# Patient Record
Sex: Male | Born: 1970 | Race: White | Hispanic: No | Marital: Single | Smoking: Never smoker
Health system: Southern US, Community
[De-identification: ages and names within clinical notes are randomized; demographics above are authoritative.]

## PROBLEM LIST (undated history)

## (undated) DIAGNOSIS — I2510 Atherosclerotic heart disease of native coronary artery without angina pectoris: Secondary | ICD-10-CM

## (undated) DIAGNOSIS — E785 Hyperlipidemia, unspecified: Secondary | ICD-10-CM

## (undated) DIAGNOSIS — F319 Bipolar disorder, unspecified: Secondary | ICD-10-CM

## (undated) DIAGNOSIS — E119 Type 2 diabetes mellitus without complications: Secondary | ICD-10-CM

## (undated) DIAGNOSIS — R7303 Prediabetes: Secondary | ICD-10-CM

## (undated) HISTORY — DX: Atherosclerotic heart disease of native coronary artery without angina pectoris: I25.10

## (undated) HISTORY — DX: Hyperlipidemia, unspecified: E78.5

## (undated) HISTORY — DX: Prediabetes: R73.03

## (undated) HISTORY — DX: Type 2 diabetes mellitus without complications: E11.9

---

## 2004-03-03 ENCOUNTER — Inpatient Hospital Stay: Payer: Self-pay | Admitting: Internal Medicine

## 2006-11-01 ENCOUNTER — Emergency Department: Payer: Self-pay | Admitting: Emergency Medicine

## 2007-03-05 ENCOUNTER — Emergency Department: Payer: Self-pay | Admitting: Emergency Medicine

## 2008-09-06 IMAGING — CT CT CERVICAL SPINE WITHOUT CONTRAST
1 series · 12 of 14 positions shown, 15 images · non-contrast
Comparison: none

REASON FOR EXAM: trauma
COMMENTS:

[Series 4: axial · axial · 0.32mm/px · z∈[-254,-81]mm · 12 of 106 slices shown, 15 images]
[im 9/106  soft-tissue]
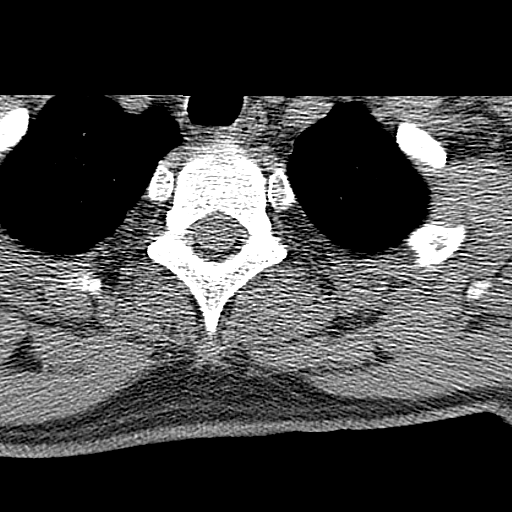
[im 9/106  bone]
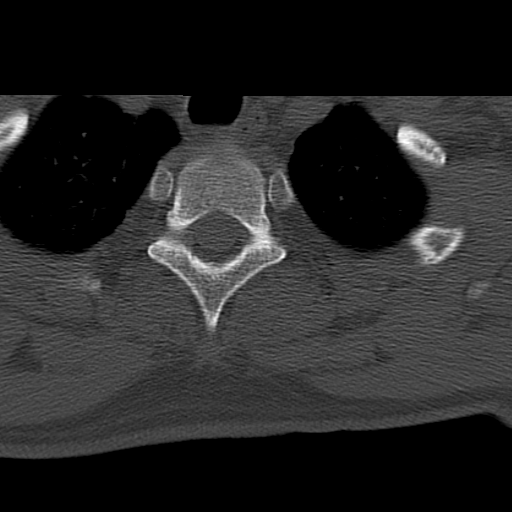
[im 17/106  bone]
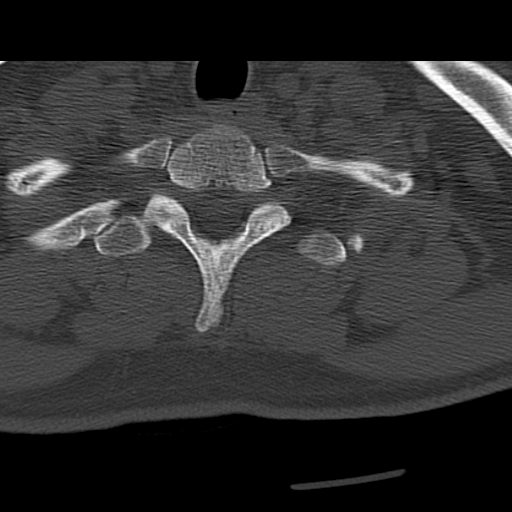
[im 25/106  bone]
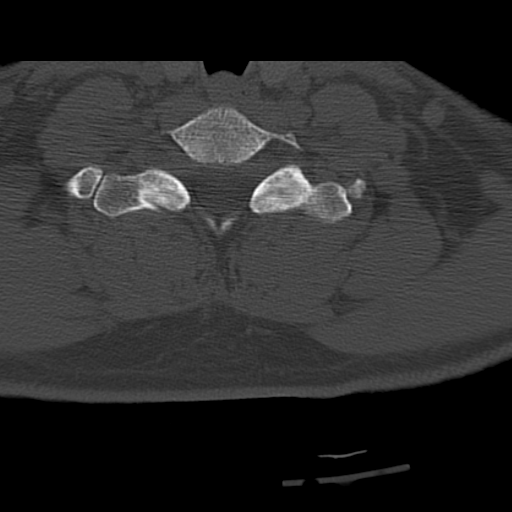
[im 33/106  bone]
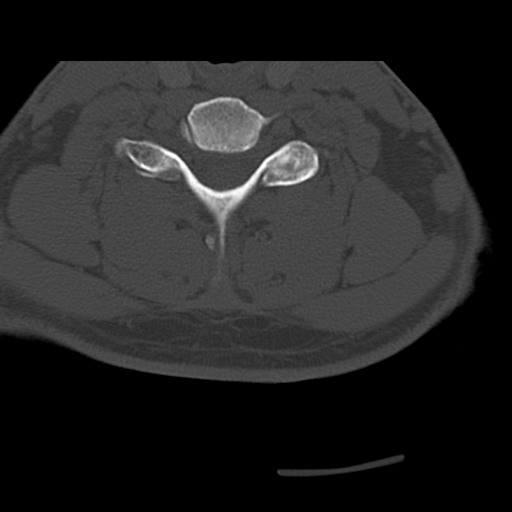
[im 41/106  soft-tissue]
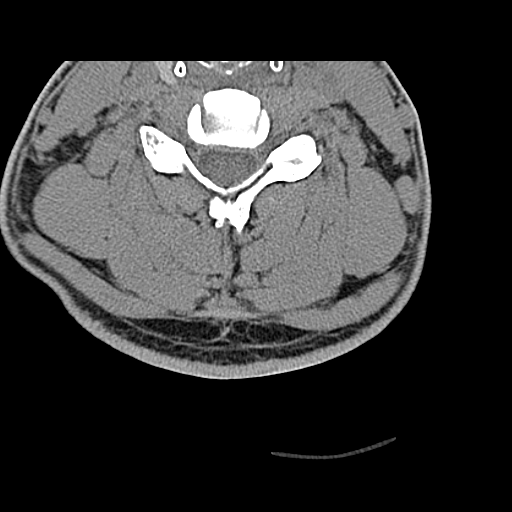
[im 41/106  bone]
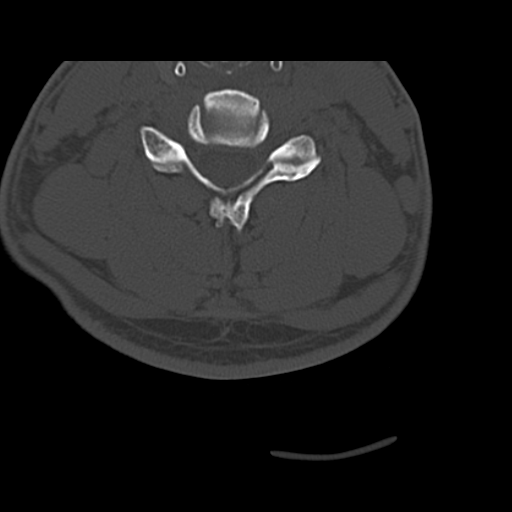
[im 49/106  bone]
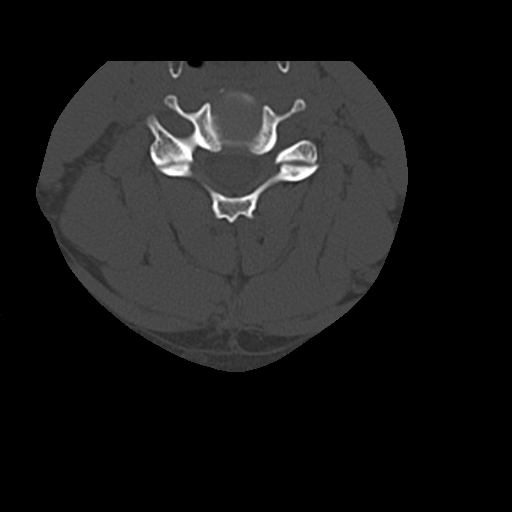
[im 57/106  bone]
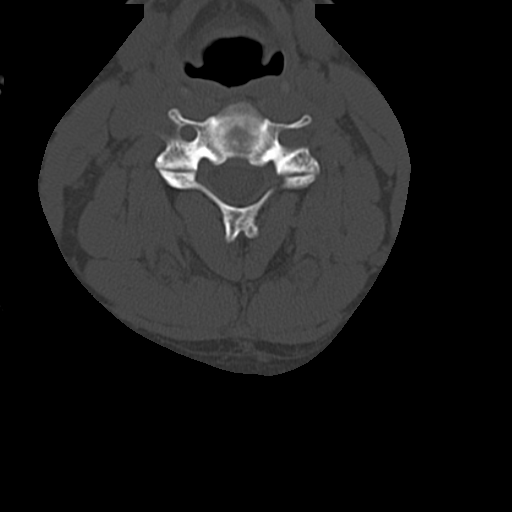
[im 65/106  bone]
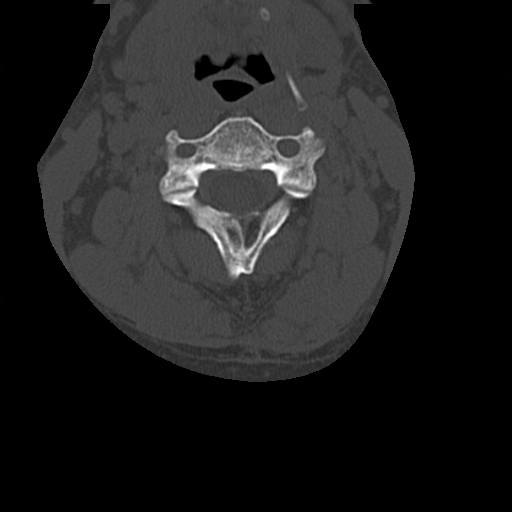
[im 73/106  soft-tissue]
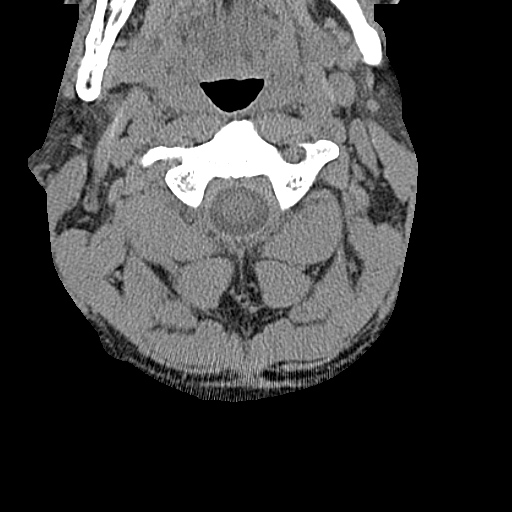
[im 73/106  bone]
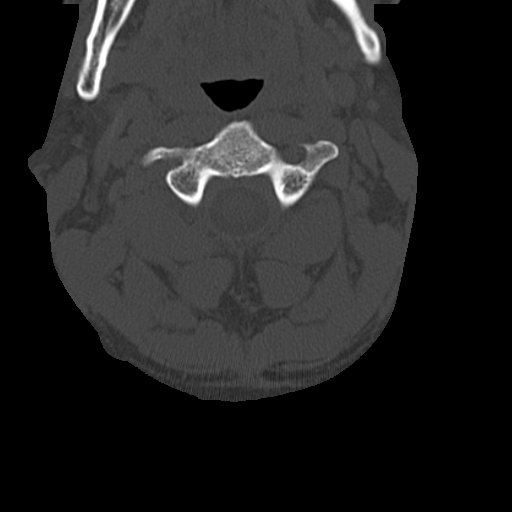
[im 81/106  bone]
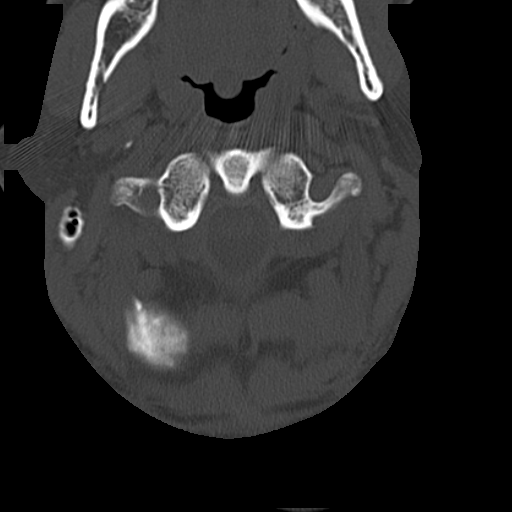
[im 89/106  bone]
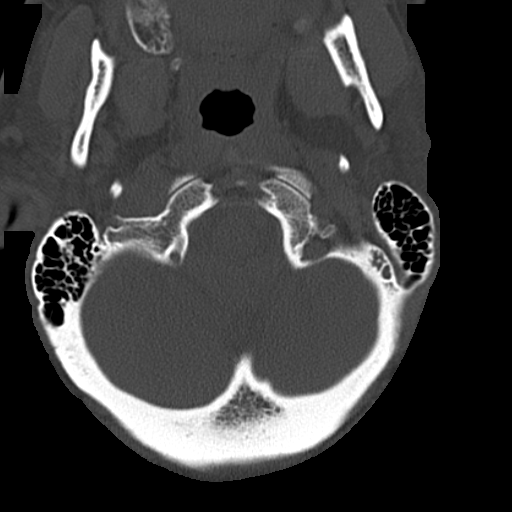
[im 97/106  bone]
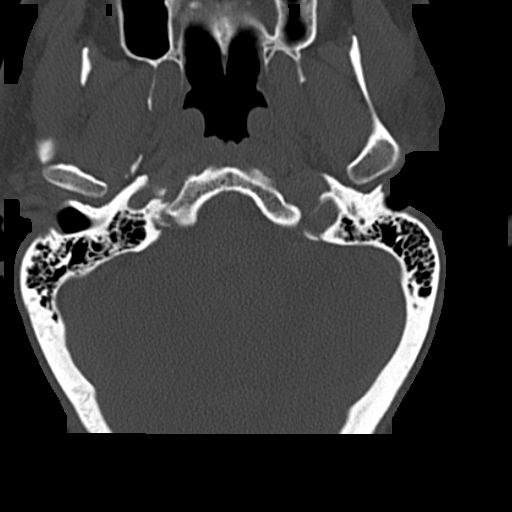

[12 of 14 positions shown; findings below may reference images not displayed]

PROCEDURE:     CT  - CT CERVICAL SPINE WO  - November 01, 2006 [DATE]

RESULT:     Emergent multislice CT of the cervical spine is performed. The
images reconstructed at bone window settings in the axial, coronal and
sagittal planes.

The study demonstrates normal alignment. The prevertebral soft tissues are
normal. The craniocervical junction and C1-C2 articulation are normal. The
odontoid is intact.
IMPRESSION: No acute bony abnormality of the cervical spine.

## 2008-09-07 IMAGING — CR DG CHEST 1V PORT
1 series · 1 of 1 positions shown · non-contrast
Comparison: none

REASON FOR EXAM: trauma
COMMENTS:

PROCEDURE:     DXR - DXR PORTABLE CHEST SINGLE VIEW  - November 02, 2006 [DATE]
RESULT:     The lung fields are clear. The heart, mediastinal and osseous
structures show no significant abnormalities.

[view not recorded]
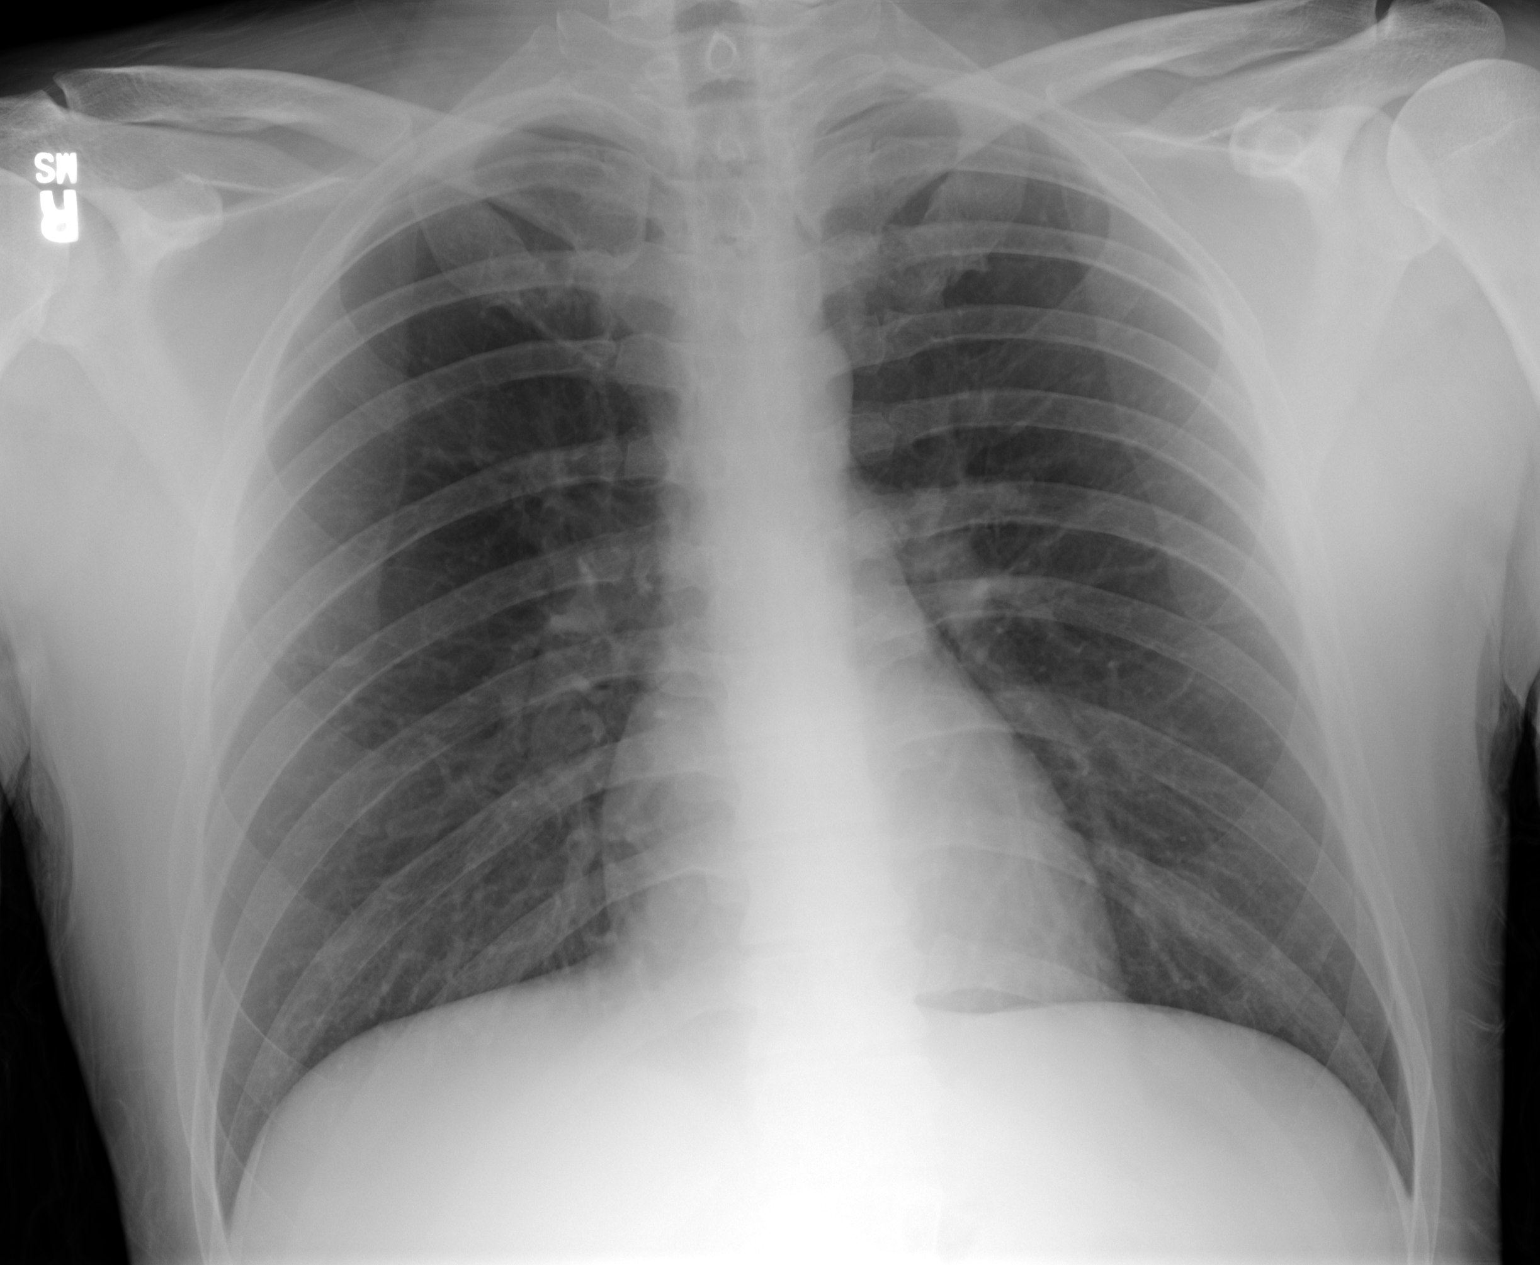

[1 of 1 positions shown; findings below may reference images not displayed]

IMPRESSION: No significant abnormalities are noted.

## 2020-04-14 ENCOUNTER — Encounter: Payer: Self-pay | Admitting: *Deleted

## 2020-04-14 ENCOUNTER — Encounter: Payer: Self-pay | Admitting: Gastroenterology

## 2020-05-05 ENCOUNTER — Other Ambulatory Visit: Payer: Self-pay

## 2020-05-05 ENCOUNTER — Ambulatory Visit: Payer: 59 | Admitting: Gastroenterology

## 2020-05-05 ENCOUNTER — Encounter: Payer: Self-pay | Admitting: Gastroenterology

## 2020-05-05 VITALS — BP 150/96 | HR 96 | Temp 98.6°F | Ht 68.0 in | Wt 201.2 lb

## 2020-05-05 DIAGNOSIS — R1319 Other dysphagia: Secondary | ICD-10-CM

## 2020-05-05 DIAGNOSIS — Z1211 Encounter for screening for malignant neoplasm of colon: Secondary | ICD-10-CM

## 2020-05-05 DIAGNOSIS — R1310 Dysphagia, unspecified: Secondary | ICD-10-CM

## 2020-05-05 MED ORDER — NA SULFATE-K SULFATE-MG SULF 17.5-3.13-1.6 GM/177ML PO SOLN: 354.0000 mL | Freq: Once | ORAL | 0 refills | Status: AC

## 2020-05-05 NOTE — Patient Instructions (Signed)
    RE: MyChart  Dear Aaron Bowers  MyChart makes it easy for you to view your health information - all in one secure location - from any computer or mobile device at any time. Use the activation code below to enroll in MyChart online at [email or web address]   Once your account is activated, you can:  Marland Kitchen View your test results. . Communicate securely with your physician's office.  . View your medical history, allergies, medications, and immunizations. . Receive care virtually through an e-Visit.   If you are over 18, you may use features of MyChart to manage the health information of your spouse, children or others you care for.  Download child and adult access forms at [email or web address].    As you activate your MyChart account and need any technical assistance, please call the MyChart technical support line at [phone]).  Be sure to also download the MyChart app for your mobile device.   Thank you for choosing [facility] for your family's health care needs!   MyChart Activation Code:  [email or web address] Expires: 06/19/2020  9:24 AM             [facility]  [address] [place], [place] [address]

## 2020-05-05 NOTE — Progress Notes (Signed)
Cephas Darby, MD [address]  [place]  [place], [place] [address]  Main: [phone]  Fax: [phone]    Gastroenterology Consultation  Referring Provider:     Ileene Bowers, [place] Primary Care Physician:  No primary care provider on file. Primary Gastroenterologist:  Dr. Cephas Darby Reason for Consultation:     Dysphagia        HPI:   Aaron Bowers is a 50 y.o. male referred by Dr. No primary care provider on file.  for consultation & management of dysphagia.  Patient has been experiencing difficulty swallowing to solids for more than a year and his symptoms have been progressively worsening.  He reports food getting stuck as well as episodes of food impaction where he had to throw it backup or force it to swallow.  He also reports difficulty swallowing to liquids sometimes.  He feels like choking sensation and tightness in his chest.  He does have history of heartburn.  He was taking omeprazole 20 mg daily until 1 year ago.  Recently due to worsening of symptoms, his PCP has started him on omeprazole 40 mg once a day.  His weight is stable.  He does report seasonal allergies.  He is also here to discuss about colonoscopy for colon cancer screening  NSAIDs: Has been taking ibuprofen 600 mg 3 times daily for his bulge disc for last 3 days  Antiplts/Anticoagulants/Anti thrombotics: None   GI Procedures: None He denies family history of GI malignancy  History reviewed. No pertinent past medical history.  History reviewed. No pertinent surgical history.  Current Outpatient Medications:  .  divalproex (DEPAKOTE ER) 500 MG 24 hr tablet, Take 500 mg by mouth 3 (three) times daily., Disp: , Rfl:  .  lamoTRIgine (LAMICTAL) 150 MG tablet, Take 150 mg by mouth 2 (two) times daily., Disp: , Rfl:  .  metaxalone (SKELAXIN) 800 MG tablet, Take 800 mg by mouth 3 (three) times daily as needed., Disp: , Rfl:  .  Na Sulfate-K Sulfate-Mg Sulf 17.5-3.13-1.6 GM/177ML SOLN, Take 354  mLs by mouth once for 1 dose., Disp: 354 mL, Rfl: 0 .  omeprazole (PRILOSEC) 40 MG capsule, Take 1 capsule by mouth daily., Disp: , Rfl:    History reviewed. No pertinent family history.   Social History   Tobacco Use  . Smoking status: Never Smoker  . Smokeless tobacco: Never Used  Substance Use Topics  . Alcohol use: Not Currently  . Drug use: Never    Allergies as of 05/05/2020  . (No Known Allergies)    Review of Systems:    All systems reviewed and negative except where noted in HPI.   Physical Exam:  BP (!) 150/96 (BP Location: Left Arm, Patient Position: Sitting, Cuff Size: Normal)   Pulse 96   Temp 98.6 F (37 C) (Oral)   Ht 5\' 8"  (1.727 m)   Wt 201 lb 4 oz (91.3 kg)   BMI 30.60 kg/m  No LMP for male patient.  General:   Alert,  Well-developed, well-nourished, pleasant and cooperative in NAD Head:  Normocephalic and atraumatic. Eyes:  Sclera clear, no icterus.   Conjunctiva pink. Ears:  Normal auditory acuity. Nose:  No deformity, discharge, or lesions. Mouth:  No deformity or lesions,oropharynx pink & moist. Neck:  Supple; no masses or thyromegaly. Lungs:  Respirations even and unlabored.  Clear throughout to auscultation.   No wheezes, crackles, or rhonchi. No acute distress. Heart:  Regular rate and rhythm; no  murmurs, clicks, rubs, or gallops. Abdomen:  Normal bowel sounds. Soft, non-tender and non-distended without masses, hepatosplenomegaly or hernias noted.  No guarding or rebound tenderness.   Rectal: Not performed Msk:  Symmetrical without gross deformities. Good, equal movement & strength bilaterally. Pulses:  Normal pulses noted. Extremities:  No clubbing or edema.  No cyanosis. Neurologic:  Alert and oriented x3;  grossly normal neurologically. Skin:  Intact without significant lesions or rashes. No jaundice. Psych:  Alert and cooperative. Normal mood and affect.  Imaging Studies: No abdominal imaging  Assessment and Plan:   Aaron Bowers is a 50 y.o. Caucasian male with chronic dysphagia to solids and sometimes to liquids, progressively worse  Dysphagia Recommend EGD to rule out eosinophilic esophagitis, peptic stricture, recommend esophageal biopsies Increase omeprazole to 40 mg twice daily before meals Avoid hard meats/foods  Colon cancer screening, average risk Recommend screening colonoscopy   Follow up in 3 months   Cephas Darby, MD

## 2020-05-13 ENCOUNTER — Encounter: Payer: Self-pay | Admitting: Gastroenterology

## 2020-05-13 ENCOUNTER — Encounter
Admission: RE | Disposition: A | Discharge status: Home / Self Care | Payer: Self-pay | Source: Home / Self Care | Attending: Gastroenterology

## 2020-05-13 ENCOUNTER — Other Ambulatory Visit: Payer: Self-pay

## 2020-05-13 ENCOUNTER — Ambulatory Visit
Admission: RE | Admit: 2020-05-13 | Discharge: 2020-05-13 | Disposition: A | Discharge status: Home / Self Care | Payer: No Typology Code available for payment source | Attending: Gastroenterology | Admitting: Gastroenterology

## 2020-05-13 ENCOUNTER — Ambulatory Visit: Payer: No Typology Code available for payment source | Admitting: Certified Registered Nurse Anesthetist

## 2020-05-13 DIAGNOSIS — Z79899 Other long term (current) drug therapy: Secondary | ICD-10-CM | POA: Insufficient documentation

## 2020-05-13 DIAGNOSIS — D122 Benign neoplasm of ascending colon: Secondary | ICD-10-CM | POA: Diagnosis not present

## 2020-05-13 DIAGNOSIS — R1310 Dysphagia, unspecified: Secondary | ICD-10-CM

## 2020-05-13 DIAGNOSIS — Z1211 Encounter for screening for malignant neoplasm of colon: Secondary | ICD-10-CM

## 2020-05-13 DIAGNOSIS — R1314 Dysphagia, pharyngoesophageal phase: Secondary | ICD-10-CM | POA: Diagnosis not present

## 2020-05-13 DIAGNOSIS — K635 Polyp of colon: Secondary | ICD-10-CM | POA: Diagnosis not present

## 2020-05-13 DIAGNOSIS — K2100 Gastro-esophageal reflux disease with esophagitis, without bleeding: Secondary | ICD-10-CM | POA: Insufficient documentation

## 2020-05-13 DIAGNOSIS — K2289 Other specified disease of esophagus: Secondary | ICD-10-CM | POA: Diagnosis not present

## 2020-05-13 DIAGNOSIS — D123 Benign neoplasm of transverse colon: Secondary | ICD-10-CM | POA: Diagnosis not present

## 2020-05-13 HISTORY — PX: COLONOSCOPY WITH PROPOFOL: SHX5780

## 2020-05-13 HISTORY — PX: ESOPHAGOGASTRODUODENOSCOPY (EGD) WITH PROPOFOL: SHX5813

## 2020-05-13 SURGERY — COLONOSCOPY WITH PROPOFOL
Anesthesia: General

## 2020-05-13 MED ORDER — PROPOFOL 500 MG/50ML IV EMUL
INTRAVENOUS | Status: DC | PRN
  Administered 2020-05-13: 175 ug/kg/min via INTRAVENOUS

## 2020-05-13 MED ORDER — PROPOFOL 10 MG/ML IV BOLUS
INTRAVENOUS | Status: DC | PRN
  Administered 2020-05-13: 80 mg via INTRAVENOUS
  Administered 2020-05-13: 40 mg via INTRAVENOUS

## 2020-05-13 MED ORDER — SODIUM CHLORIDE 0.9 % IV SOLN
INTRAVENOUS | Status: DC
  Administered 2020-05-13: 20 mL/h via INTRAVENOUS

## 2020-05-13 MED ORDER — LIDOCAINE HCL (PF) 2 % IJ SOLN
INTRAMUSCULAR | Status: AC
  Filled 2020-05-13: qty 4

## 2020-05-13 MED ORDER — LIDOCAINE HCL (CARDIAC) PF 100 MG/5ML IV SOSY
PREFILLED_SYRINGE | INTRAVENOUS | Status: DC | PRN
  Administered 2020-05-13: 40 mg via INTRAVENOUS

## 2020-05-13 MED ORDER — PROPOFOL 500 MG/50ML IV EMUL
INTRAVENOUS | Status: AC
  Filled 2020-05-13: qty 50

## 2020-05-13 NOTE — Op Note (Signed)
[facility] Patient Name: Aaron Bowers Procedure Date: 05/13/2020 8:20 AM MRN: [id number] Account #: [id number] Date of Birth: [birth year] Admit Type: Outpatient Age: 50 Room: [facility] ENDO ROOM 4 Gender: Male Note Status: Finalized Procedure:             Colonoscopy Indications:           Screening for colorectal malignant neoplasm, This is                         the patient's first colonoscopy Providers:             Lin Landsman MD, MD Medicines:             General Anesthesia Complications:         No immediate complications. Estimated blood loss: None. Procedure:             Pre-Anesthesia Assessment:                        - Prior to the procedure, a History and Physical was                         performed, and patient medications and allergies were                         reviewed. The patient is competent. The risks and                         benefits of the procedure and the sedation options and                         risks were discussed with the patient. All questions                         were answered and informed consent was obtained.                         Patient identification and proposed procedure were                         verified by the physician, the nurse, the                         anesthesiologist, the anesthetist and the technician                         in the pre-procedure area in the procedure room in the                         endoscopy suite. Mental Status Examination: alert and                         oriented. Airway Examination: normal oropharyngeal                         airway and neck mobility. Respiratory Examination:                         clear to auscultation. CV Examination: normal.  Prophylactic Antibiotics: The patient does not require                         prophylactic antibiotics. Prior Anticoagulants: The                         patient has taken no previous  anticoagulant or                         antiplatelet agents. ASA Grade Assessment: II - A                         patient with mild systemic disease. After reviewing                         the risks and benefits, the patient was deemed in                         satisfactory condition to undergo the procedure. The                         anesthesia plan was to use general anesthesia.                         Immediately prior to administration of medications,                         the patient was re-assessed for adequacy to receive                         sedatives. The heart rate, respiratory rate, oxygen                         saturations, blood pressure, adequacy of pulmonary                         ventilation, and response to care were monitored                         throughout the procedure. The physical status of the                         patient was re-assessed after the procedure.                        After obtaining informed consent, the colonoscope was                         passed under direct vision. Throughout the procedure,                         the patient's blood pressure, pulse, and oxygen                         saturations were monitored continuously. The                         Colonoscope was introduced through the anus and  advanced to the the terminal ileum, with                         identification of the appendiceal orifice and IC                         valve. The colonoscopy was performed without                         difficulty. The patient tolerated the procedure well.                         The quality of the bowel preparation was evaluated                         using the BBPS [facility] Bowel Preparation Scale) with                         scores of: Right Colon = 3, Transverse Colon = 3 and                         Left Colon = 3 (entire mucosa seen well with no                         residual staining, small fragments  of stool or opaque                         liquid). The total BBPS score equals 9. Findings:      The perianal and digital rectal examinations were normal. Pertinent       negatives include normal sphincter tone and no palpable rectal lesions.      Two sessile polyps were found in the transverse colon and ascending       colon. The polyps were 5 mm in size. These polyps were removed with a       cold snare. Resection and retrieval were complete. Estimated blood loss:       none.      The terminal ileum appeared normal.      The retroflexed view of the distal rectum and anal verge was normal and       showed no anal or rectal abnormalities. Impression:            - Two 5 mm polyps in the transverse colon and in the                         ascending colon, removed with a cold snare. Resected                         and retrieved.                        - The examined portion of the ileum was normal.                        - The distal rectum and anal verge are normal on                         retroflexion view. Recommendation:        -  Discharge patient to home (with escort).                        - Resume previous diet today.                        - Continue present medications.                        - Await pathology results.                        - Repeat colonoscopy in 7 years for surveillance.                        - Return to my office as previously scheduled. Procedure Code(s):     --- Professional ---                        [phone], Colonoscopy, flexible; with removal of                         tumor(s), polyp(s), or other lesion(s) by snare                         technique Diagnosis Code(s):     --- Professional ---                        Z12.11, Encounter for screening for malignant neoplasm                         of colon                        K63.5, Polyp of colon CPT copyright 2019 American Medical Association. All rights reserved. The codes documented in this  report are preliminary and upon coder review may  be revised to meet current compliance requirements. Dr. Ulyess Mort Lin Landsman MD, MD 05/13/2020 9:08:51 AM This report has been signed electronically. Number of Addenda: 0 Note Initiated On: 05/13/2020 8:20 AM Scope Withdrawal Time: 0 hours 11 minutes 4 seconds  Total Procedure Duration: 0 hours 13 minutes 51 seconds  Estimated Blood Loss:  Estimated blood loss: none.      [facility]

## 2020-05-13 NOTE — Anesthesia Procedure Notes (Signed)
Date/Time: 05/13/2020 8:39 AM Performed by: Johnna Acosta, CRNA Pre-anesthesia Checklist: Patient identified, Emergency Drugs available, Suction available, Patient being monitored and Timeout performed Patient Re-evaluated:Patient Re-evaluated prior to induction Oxygen Delivery Method: Nasal cannula Preoxygenation: Pre-oxygenation with 100% oxygen Induction Type: IV induction

## 2020-05-13 NOTE — Anesthesia Preprocedure Evaluation (Signed)
Anesthesia Evaluation  Patient identified by MRN, date of birth, ID band Patient awake    Reviewed: Allergy & Precautions, NPO status , Patient's Chart, lab work & pertinent test results  Airway Mallampati: II  TM Distance: >3 FB Neck ROM: Full    Dental no notable dental hx.    Pulmonary neg pulmonary ROS,    Pulmonary exam normal        Cardiovascular negative cardio ROS Normal cardiovascular exam     Neuro/Psych PSYCHIATRIC DISORDERS Bipolar Disorder negative neurological ROS     GI/Hepatic negative GI ROS, Neg liver ROS,   Endo/Other  negative endocrine ROS  Renal/GU negative Renal ROS  negative genitourinary   Musculoskeletal negative musculoskeletal ROS (+)   Abdominal   Peds negative pediatric ROS (+)  Hematology negative hematology ROS (+)   Anesthesia Other Findings   Reproductive/Obstetrics negative OB ROS                             Anesthesia Physical Anesthesia Plan  ASA: II  Anesthesia Plan: General   Post-op Pain Management:    Induction: Intravenous  PONV Risk Score and Plan: Propofol infusion and TIVA  Airway Management Planned: Nasal Cannula and Natural Airway  Additional Equipment:   Intra-op Plan:   Post-operative Plan:   Informed Consent: I have reviewed the patients History and Physical, chart, labs and discussed the procedure including the risks, benefits and alternatives for the proposed anesthesia with the patient or authorized representative who has indicated his/her understanding and acceptance.       Plan Discussed with: CRNA, Anesthesiologist and Surgeon  Anesthesia Plan Comments:         Anesthesia Quick Evaluation

## 2020-05-13 NOTE — H&P (Signed)
  Aaron Darby, MD [place]  [place], [place] [address]  Main: [phone]  Fax: [phone] Pager: [phone]  Primary Care Physician:  Pcp, No Primary Gastroenterologist:  Dr. Cephas Bowers  Pre-Procedure History & Physical: HPI:  SHOLOM Bowers is a 50 y.o. male is here for an endoscopy and colonoscopy.   No past medical history on file.  No past surgical history on file.  Prior to Admission medications   Medication Sig Start Date End Date Taking? Authorizing Provider  divalproex (DEPAKOTE ER) 500 MG 24 hr tablet Take 500 mg by mouth 3 (three) times daily. 04/14/20  Yes [provider]  lamoTRIgine (LAMICTAL) 150 MG tablet Take 150 mg by mouth 2 (two) times daily. 04/14/20  Yes [provider]  metaxalone (SKELAXIN) 800 MG tablet Take 800 mg by mouth 3 (three) times daily as needed. 04/14/20  Yes [provider]  omeprazole (PRILOSEC) 40 MG capsule Take 1 capsule by mouth daily. 04/14/20  Yes [provider]    Allergies as of 05/05/2020  . (No Known Allergies)    No family history on file.  Social History   Socioeconomic History  . Marital status: Single    Spouse name: Not on file  . Number of children: Not on file  . Years of education: Not on file  . Highest education level: Not on file  Occupational History  . Not on file  Tobacco Use  . Smoking status: Never Smoker  . Smokeless tobacco: Never Used  Substance and Sexual Activity  . Alcohol use: Not Currently  . Drug use: Never  . Sexual activity: Not on file  Other Topics Concern  . Not on file  Social History Narrative  . Not on file   Social Determinants of Health   Financial Resource Strain: Not on file  Food Insecurity: Not on file  Transportation Needs: Not on file  Physical Activity: Not on file  Stress: Not on file  Social Connections: Not on file  Intimate Partner Violence: Not on file    Review of Systems: See HPI, otherwise  negative ROS  Physical Exam: BP 121/75   Pulse 68   Temp (!) 96.4 F (35.8 C) (Temporal)   Resp 20   Ht 5\' 8"  (1.727 m)   Wt 83.9 kg   SpO2 99%   BMI 28.13 kg/m  General:   Alert,  pleasant and cooperative in NAD Head:  Normocephalic and atraumatic. Neck:  Supple; no masses or thyromegaly. Lungs:  Clear throughout to auscultation.    Heart:  Regular rate and rhythm. Abdomen:  Soft, nontender and nondistended. Normal bowel sounds, without guarding, and without rebound.   Neurologic:  Alert and  oriented x4;  grossly normal neurologically.  Impression/Plan: JESSEY Bowers is here for an endoscopy and colonoscopy to be performed for dysphagia, colon cancer screening  Risks, benefits, limitations, and alternatives regarding  endoscopy and colonoscopy have been reviewed with the patient.  Questions have been answered.  All parties agreeable.   Sherri Sear, MD  05/13/2020, 8:28 AM

## 2020-05-13 NOTE — Anesthesia Postprocedure Evaluation (Signed)
Anesthesia Post Note  Patient: Aaron Bowers  Procedure(s) Performed: COLONOSCOPY WITH PROPOFOL (N/A ) ESOPHAGOGASTRODUODENOSCOPY (EGD) WITH PROPOFOL (N/A )  Patient location during evaluation: Phase II Anesthesia Type: General Level of consciousness: awake and alert, awake and oriented Pain management: pain level controlled Vital Signs Assessment: post-procedure vital signs reviewed and stable Respiratory status: spontaneous breathing, nonlabored ventilation and respiratory function stable Cardiovascular status: blood pressure returned to baseline and stable Postop Assessment: no apparent nausea or vomiting Anesthetic complications: no   No complications documented.   Last Vitals:  Vitals:   05/13/20 0930 05/13/20 0940  BP: 129/82 (!) 136/94  Pulse: 73 71  Resp: (!) 23 13  Temp:    SpO2: 99% 100%    Last Pain:  Vitals:   05/13/20 0900  TempSrc: Temporal  PainSc:                  Phill Mutter

## 2020-05-13 NOTE — Op Note (Signed)
[facility] Gastroenterology Patient Name: Aaron Bowers Procedure Date: 05/13/2020 8:21 AM MRN: [id number] Account #: [id number] Date of Birth: [birth year] Admit Type: Outpatient Age: 50 Room: [facility] ENDO ROOM 4 Gender: Male Note Status: Finalized Procedure:             Upper GI endoscopy Indications:           Esophageal dysphagia Providers:             Lin Landsman MD, MD Medicines:             General Anesthesia Complications:         No immediate complications. Estimated blood loss: None. Procedure:             Pre-Anesthesia Assessment:                        - Prior to the procedure, a History and Physical was                         performed, and patient medications and allergies were                         reviewed. The patient is competent. The risks and                         benefits of the procedure and the sedation options and                         risks were discussed with the patient. All questions                         were answered and informed consent was obtained.                         Patient identification and proposed procedure were                         verified by the physician, the nurse, the                         anesthesiologist, the anesthetist and the technician                         in the pre-procedure area in the procedure room in the                         endoscopy suite. Mental Status Examination: alert and                         oriented. Airway Examination: normal oropharyngeal                         airway and neck mobility. Respiratory Examination:                         clear to auscultation. CV Examination: normal.                         Prophylactic Antibiotics: The patient does not require  prophylactic antibiotics. Prior Anticoagulants: The                         patient has taken no previous anticoagulant or                         antiplatelet agents. ASA Grade  Assessment: II - A                         patient with mild systemic disease. After reviewing                         the risks and benefits, the patient was deemed in                         satisfactory condition to undergo the procedure. The                         anesthesia plan was to use general anesthesia.                         Immediately prior to administration of medications,                         the patient was re-assessed for adequacy to receive                         sedatives. The heart rate, respiratory rate, oxygen                         saturations, blood pressure, adequacy of pulmonary                         ventilation, and response to care were monitored                         throughout the procedure. The physical status of the                         patient was re-assessed after the procedure.                        After obtaining informed consent, the endoscope was                         passed under direct vision. Throughout the procedure,                         the patient's blood pressure, pulse, and oxygen                         saturations were monitored continuously. The Endoscope                         was introduced through the mouth, and advanced to the                         second part of duodenum. The upper GI endoscopy was  accomplished without difficulty. The patient tolerated                         the procedure well. Findings:      The duodenal bulb and second portion of the duodenum were normal.      The entire examined stomach was normal.      The cardia and gastric fundus were normal on retroflexion.      Esophagogastric landmarks were identified: the gastroesophageal junction       was found at 39 cm from the incisors.      Mucosal changes including feline appearance, longitudinal furrows and       congestion (edema) were found in the entire esophagus. Esophageal       findings were graded using the  Eosinophilic Esophagitis Endoscopic       Reference Score (EoE-EREFS) as: Edema Grade 0 Normal (distinct vascular       markings), Rings Grade 1 Mild (subtle circumferential ridges seen on       esophageal distension), Exudates Grade 0 None (no white lesions seen),       Furrows Grade 1 Present (vertical lines with or without visible depth)       and Stricture none (no stricture found). Biopsies were obtained from the       proximal and distal esophagus with cold forceps for histology of       suspected eosinophilic esophagitis. Estimated blood loss: none. Impression:            - Normal duodenal bulb and second portion of the                         duodenum.                        - Normal stomach.                        - Esophagogastric landmarks identified.                        - Esophageal mucosal changes suggestive of                         eosinophilic esophagitis. Biopsied. Recommendation:        - Await pathology results.                        - Continue present medications.                        - Return to my office as previously scheduled.                        - Proceed with colonoscopy as scheduled                        See colonoscopy report Procedure Code(s):     --- Professional ---                        [phone], Esophagogastroduodenoscopy, flexible,                         transoral; with biopsy, single or multiple  Diagnosis Code(s):     --- Professional ---                        K22.8, Other specified diseases of esophagus                        R13.14, Dysphagia, pharyngoesophageal phase CPT copyright 2019 American Medical Association. All rights reserved. The codes documented in this report are preliminary and upon coder review may  be revised to meet current compliance requirements. Dr. Ulyess Mort Lin Landsman MD, MD 05/13/2020 8:50:56 AM This report has been signed electronically. Number of Addenda: 0 Note Initiated On: 05/13/2020 8:21  AM Estimated Blood Loss:  Estimated blood loss: none.      [facility]

## 2020-05-13 NOTE — Transfer of Care (Signed)
Immediate Anesthesia Transfer of Care Note  Patient: Aaron Bowers  Procedure(s) Performed: COLONOSCOPY WITH PROPOFOL (N/A ) ESOPHAGOGASTRODUODENOSCOPY (EGD) WITH PROPOFOL (N/A )  Patient Location: PACU  Anesthesia Type:General  Level of Consciousness: sedated  Airway & Oxygen Therapy: Patient Spontanous Breathing  Post-op Assessment: Report given to RN and Post -op Vital signs reviewed and stable  Post vital signs: Reviewed and stable  Last Vitals:  Vitals Value Taken Time  BP 97/62 05/13/20 0913  Temp    Pulse 80 05/13/20 0913  Resp 13 05/13/20 0913  SpO2 96 % 05/13/20 0913    Last Pain:  Vitals:   05/13/20 0821  TempSrc: Temporal  PainSc: 0-No pain         Complications: No complications documented.

## 2020-05-14 ENCOUNTER — Encounter: Payer: Self-pay | Admitting: Gastroenterology

## 2020-05-14 ENCOUNTER — Telehealth: Payer: Self-pay

## 2020-05-14 LAB — SURGICAL PATHOLOGY

## 2020-05-14 MED ORDER — OMEPRAZOLE 40 MG PO CPDR: 40.0000 mg | DELAYED_RELEASE_CAPSULE | Freq: Two times a day (BID) | ORAL | 0 refills | Status: DC

## 2020-05-14 NOTE — Telephone Encounter (Signed)
-----   Message from Lin Landsman, MD sent at 05/14/2020  2:04 PM EDT ----- Pathology results from esophagus were normal. Can increase prilosec 40mg  to BID until he sees me. Please send in a prescription  Thanks RV

## 2020-05-14 NOTE — Telephone Encounter (Signed)
Called patient and left a detail message and sent medication to the pharmacy

## 2020-05-26 ENCOUNTER — Telehealth: Payer: Self-pay | Admitting: Gastroenterology

## 2020-05-26 NOTE — Telephone Encounter (Signed)
Called and left a message for call back  

## 2020-05-26 NOTE — Telephone Encounter (Signed)
Patient wants to discuss his test results. Clinical staff will follow up with patient.

## 2020-05-27 ENCOUNTER — Telehealth: Payer: Self-pay | Admitting: Gastroenterology

## 2020-05-27 NOTE — Telephone Encounter (Signed)
Patient returning call, had incorrect call back number

## 2020-05-27 NOTE — Telephone Encounter (Signed)
Called and left a message for call back  

## 2020-05-27 NOTE — Telephone Encounter (Signed)
Gave patient the results and he verbalized understanding

## 2020-07-08 ENCOUNTER — Other Ambulatory Visit: Payer: Self-pay

## 2020-07-08 ENCOUNTER — Ambulatory Visit: Payer: No Typology Code available for payment source | Admitting: Gastroenterology

## 2020-07-08 ENCOUNTER — Encounter: Payer: Self-pay | Admitting: Gastroenterology

## 2020-07-08 VITALS — BP 137/77 | HR 87 | Temp 97.7°F | Ht 68.0 in | Wt 199.1 lb

## 2020-07-08 DIAGNOSIS — K219 Gastro-esophageal reflux disease without esophagitis: Secondary | ICD-10-CM

## 2020-07-08 DIAGNOSIS — R1319 Other dysphagia: Secondary | ICD-10-CM | POA: Diagnosis not present

## 2020-07-08 NOTE — Patient Instructions (Signed)
Gastroesophageal Reflux Disease, Adult  Gastroesophageal reflux (GER) happens when acid from the stomach flows up into the tube that connects the mouth and the stomach (esophagus). Normally, food travels down the esophagus and stays in the stomach to be digested. With GER, food and stomach acid sometimes move back up into theesophagus. You may have a disease called gastroesophageal reflux disease (GERD) if the reflux: Happens often. Causes frequent or very bad symptoms. Causes problems such as damage to the esophagus. When this happens, the esophagus becomes sore and swollen. Over time, GERD can make small holes (ulcers) in the lining of the esophagus. What are the causes? This condition is caused by a problem with the muscle between the esophagus and the stomach. When this muscle is weak or not normal, it does not close properlyto keep food and acid from coming back up from the stomach. The muscle can be weak because of: Tobacco use. Pregnancy. Having a certain type of hernia (hiatal hernia). Alcohol use. Certain foods and drinks, such as coffee, chocolate, onions, and peppermint. What increases the risk? Being overweight. Having a disease that affects your connective tissue. Taking NSAIDs, such a ibuprofen. What are the signs or symptoms? Heartburn. Difficult or painful swallowing. The feeling of having a lump in the throat. A bitter taste in the mouth. Bad breath. Having a lot of saliva. Having an upset or bloated stomach. Burping. Chest pain. Different conditions can cause chest pain. Make sure you see your doctor if you have chest pain. Shortness of breath or wheezing. A long-term cough or a cough at night. Wearing away of the surface of teeth (tooth enamel). Weight loss. How is this treated? Making changes to your diet. Taking medicine. Having surgery. Treatment will depend on how bad your symptoms are. Follow these instructions at home: Eating and drinking  Follow a  diet as told by your doctor. You may need to avoid foods and drinks such as: Coffee and tea, with or without caffeine. Drinks that contain alcohol. Energy drinks and sports drinks. Bubbly (carbonated) drinks or sodas. Chocolate and cocoa. Peppermint and mint flavorings. Garlic and onions. Horseradish. Spicy and acidic foods. These include peppers, chili powder, curry powder, vinegar, hot sauces, and BBQ sauce. Citrus fruit juices and citrus fruits, such as oranges, lemons, and limes. Tomato-based foods. These include red sauce, chili, salsa, and pizza with red sauce. Fried and fatty foods. These include donuts, french fries, potato chips, and high-fat dressings. High-fat meats. These include hot dogs, rib eye steak, sausage, ham, and bacon. High-fat dairy items, such as whole milk, butter, and cream cheese. Eat small meals often. Avoid eating large meals. Avoid drinking large amounts of liquid with your meals. Avoid eating meals during the 2-3 hours before bedtime. Avoid lying down right after you eat. Do not exercise right after you eat.  Lifestyle  Do not smoke or use any products that contain nicotine or tobacco. If you need help quitting, ask your doctor. Try to lower your stress. If you need help doing this, ask your doctor. If you are overweight, lose an amount of weight that is healthy for you. Ask your doctor about a safe weight loss goal.  General instructions Pay attention to any changes in your symptoms. Take over-the-counter and prescription medicines only as told by your doctor. Do not take aspirin, ibuprofen, or other NSAIDs unless your doctor says it is okay. Wear loose clothes. Do not wear anything tight around your waist. Raise (elevate) the head of your bed about  6 inches (15 cm). You may need to use a wedge to do this. Avoid bending over if this makes your symptoms worse. Keep all follow-up visits. Contact a doctor if: You have new symptoms. You lose weight and  you do not know why. You have trouble swallowing or it hurts to swallow. You have wheezing or a cough that keeps happening. You have a hoarse voice. Your symptoms do not get better with treatment. Get help right away if: You have sudden pain in your arms, neck, jaw, teeth, or back. You suddenly feel sweaty, dizzy, or light-headed. You have chest pain or shortness of breath. You vomit and the vomit is green, yellow, or black, or it looks like blood or coffee grounds. You faint. Your poop (stool) is red, bloody, or black. You cannot swallow, drink, or eat. These symptoms may represent a serious problem that is an emergency. Do not wait to see if the symptoms will go away. Get medical help right away. Call your local emergency services (911 in the [place] Do not drive yourself to the hospital. Summary If a person has gastroesophageal reflux disease (GERD), food and stomach acid move back up into the esophagus and cause symptoms or problems such as damage to the esophagus. Treatment will depend on how bad your symptoms are. Follow a diet as told by your doctor. Take all medicines only as told by your doctor. This information is not intended to replace advice given to you by your health care provider. Make sure you discuss any questions you have with your healthcare provider. Document Revised: 07/01/2019 Document Reviewed: 07/01/2019 Elsevier Patient Education  [employer].

## 2020-07-08 NOTE — Progress Notes (Signed)
Cephas Darby, MD [address]  [place]  [place], [place] [address]  Main: [phone]  Fax: [phone]    Gastroenterology Consultation  Referring Provider:     No ref. provider found Primary Care Physician:  Pcp, No Primary Gastroenterologist:  Dr. Cephas Darby Reason for Consultation:     Dysphagia, GERD        HPI:   Aaron Bowers is a 50 y.o. male referred by Dr. Merryl Hacker, No  for consultation & management of dysphagia.  Patient has been experiencing difficulty swallowing to solids for more than a year and his symptoms have been progressively worsening.  He reports food getting stuck as well as episodes of food impaction where he had to throw it backup or force it to swallow.  He also reports difficulty swallowing to liquids sometimes.  He feels like choking sensation and tightness in his chest.  He does have history of heartburn.  He was taking omeprazole 20 mg daily until 1 year ago.  Recently due to worsening of symptoms, his PCP has started him on omeprazole 40 mg once a day.  His weight is stable.  He does report seasonal allergies.  He is also here to discuss about colonoscopy for colon cancer screening  Follow-up visit 07/08/2020 Patient is here for follow-up of difficulty swallowing.  He underwent upper endoscopy which revealed reflux esophagitis on histology.  There was no evidence of eosinophilic esophagitis.  I started him on omeprazole 40 mg twice daily.  Patient reports that his choking episodes have resolved.  He does report burning sensation in his throat as well as swelling in his throat.  NSAIDs: Has been taking ibuprofen 600 mg 3 times daily for his bulge disc for last 3 days  Antiplts/Anticoagulants/Anti thrombotics: None   GI Procedures:  EGD and colonoscopy 05/13/20 - Normal duodenal bulb and second portion of the duodenum. - Normal stomach. - Esophagogastric landmarks identified. - Esophageal mucosal changes suggestive of eosinophilic  esophagitis. Biopsied.  - Two 5 mm polyps in the transverse colon and in the ascending colon, removed with a cold snare. Resected and retrieved. - The examined portion of the ileum was normal. - The distal rectum and anal verge are normal on retroflexion view.  DIAGNOSIS:  A. ESOPHAGUS, DISTAL; COLD BIOPSY:  - REFLUX ESOPHAGITIS.  - NEGATIVE FOR SIGNIFICANTLY INCREASED EOSINOPHILS (FOCALLY UP TO 5 IN A  HIGH POWER FIELD).  - NEGATIVE FOR INTESTINAL METAPLASIA, DYSPLASIA, AND MALIGNANCY.   B. ESOPHAGUS, PROXIMAL; COLD BIOPSY:  - REFLUX ESOPHAGITIS.  - NEGATIVE FOR SIGNIFICANTLY INCREASED EOSINOPHILS (FOCALLY UP TO 5 IN A  HIGH POWER FIELD).  - NEGATIVE FOR INTESTINAL METAPLASIA, DYSPLASIA, AND MALIGNANCY.   C. COLON POLYP, ASCENDING; COLD SNARE:  - TUBULAR ADENOMA.  - NEGATIVE FOR HIGH-GRADE DYSPLASIA AND MALIGNANCY.   D. COLON POLYP, TRANSVERSE; COLD SNARE:  - TUBULAR ADENOMA.  - NEGATIVE FOR HIGH-GRADE DYSPLASIA AND MALIGNANCY.   History reviewed. No pertinent past medical history.  Past Surgical History:  Procedure Laterality Date   COLONOSCOPY WITH PROPOFOL N/A 05/13/2020   Procedure: COLONOSCOPY WITH PROPOFOL;  Surgeon: Lin Landsman, MD;  Location: [facility] ENDOSCOPY;  Service: Gastroenterology;  Laterality: N/A;   ESOPHAGOGASTRODUODENOSCOPY (EGD) WITH PROPOFOL N/A 05/13/2020   Procedure: ESOPHAGOGASTRODUODENOSCOPY (EGD) WITH PROPOFOL;  Surgeon: Lin Landsman, MD;  Location: [facility] ENDOSCOPY;  Service: Gastroenterology;  Laterality: N/A;    Current Outpatient Medications:    divalproex (DEPAKOTE ER) 500 MG 24 hr tablet, Take 500 mg  by mouth 3 (three) times daily., Disp: , Rfl:    lamoTRIgine (LAMICTAL) 150 MG tablet, Take 150 mg by mouth 2 (two) times daily., Disp: , Rfl:    metaxalone (SKELAXIN) 800 MG tablet, Take 800 mg by mouth 3 (three) times daily as needed., Disp: , Rfl:    omeprazole (PRILOSEC) 40 MG capsule, Take 1 capsule (40 mg total) by mouth 2 (two)  times daily before a meal., Disp: 180 capsule, Rfl: 0   No family history on file.   Social History   Tobacco Use   Smoking status: Never   Smokeless tobacco: Never  Substance Use Topics   Alcohol use: Not Currently   Drug use: Never    Allergies as of 07/08/2020   (No Known Allergies)    Review of Systems:    All systems reviewed and negative except where noted in HPI.   Physical Exam:  BP 137/77 (BP Location: Left Arm, Patient Position: Sitting, Cuff Size: Normal)   Pulse 87   Temp 97.7 F (36.5 C) (Oral)   Ht 5\' 8"  (1.727 m)   Wt 199 lb 2 oz (90.3 kg)   BMI 30.28 kg/m  No LMP for male patient.  General:   Alert,  Well-developed, well-nourished, pleasant and cooperative in NAD Head:  Normocephalic and atraumatic. Eyes:  Sclera clear, no icterus.   Conjunctiva pink. Ears:  Normal auditory acuity. Nose:  No deformity, discharge, or lesions. Mouth:  No deformity or lesions,oropharynx pink & moist. Neck:  Supple; no masses or thyromegaly. Lungs:  Respirations even and unlabored.  Clear throughout to auscultation.   No wheezes, crackles, or rhonchi. No acute distress. Heart:  Regular rate and rhythm; no murmurs, clicks, rubs, or gallops. Abdomen:  Normal bowel sounds. Soft, non-tender and non-distended without masses, hepatosplenomegaly or hernias noted.  No guarding or rebound tenderness.   Rectal: Not performed Msk:  Symmetrical without gross deformities. Good, equal movement & strength bilaterally. Pulses:  Normal pulses noted. Extremities:  No clubbing or edema.  No cyanosis. Neurologic:  Alert and oriented x3;  grossly normal neurologically. Skin:  Intact without significant lesions or rashes. No jaundice. Psych:  Alert and cooperative. Normal mood and affect.  Imaging Studies: No abdominal imaging  Assessment and Plan:   DOM HAVERLAND is a 50 y.o. Caucasian male with history of chronic dysphagia to solids and sometimes to liquids, progressively worse.   Patient underwent EGD, no evidence of eosinophilic esophagitis, proximal and distal esophageal biopsies revealed reflux esophagitis only  GERD: Symptoms partially resolved Continue omeprazole 40 mg twice daily before meals for 1 more month, then decrease to 40 mg daily Discussed about antireflux lifestyle, information provided  Personal history of tubular adenomas of the colon Recommend surveillance colonoscopy in 5 years   Follow up in 3-4 months   Cephas Darby, MD

## 2020-07-14 ENCOUNTER — Other Ambulatory Visit: Payer: Self-pay | Admitting: Gastroenterology

## 2020-10-08 ENCOUNTER — Ambulatory Visit: Payer: No Typology Code available for payment source | Admitting: Gastroenterology

## 2020-10-23 ENCOUNTER — Other Ambulatory Visit: Payer: Self-pay | Admitting: Gastroenterology

## 2020-10-23 ENCOUNTER — Other Ambulatory Visit: Payer: Self-pay

## 2020-10-23 MED ORDER — OMEPRAZOLE 40 MG PO CPDR: 40.0000 mg | DELAYED_RELEASE_CAPSULE | Freq: Two times a day (BID) | ORAL | 0 refills | Status: DC

## 2020-10-23 NOTE — Progress Notes (Signed)
Sent in refill to pharmacy.

## 2020-11-18 ENCOUNTER — Ambulatory Visit: Payer: No Typology Code available for payment source | Admitting: Gastroenterology

## 2021-01-12 ENCOUNTER — Ambulatory Visit (INDEPENDENT_AMBULATORY_CARE_PROVIDER_SITE_OTHER): Payer: No Typology Code available for payment source | Admitting: Gastroenterology

## 2021-01-12 ENCOUNTER — Encounter: Payer: Self-pay | Admitting: Gastroenterology

## 2021-01-12 VITALS — BP 136/83 | HR 84 | Temp 98.3°F | Ht 68.0 in | Wt 200.0 lb

## 2021-01-12 DIAGNOSIS — K219 Gastro-esophageal reflux disease without esophagitis: Secondary | ICD-10-CM | POA: Insufficient documentation

## 2021-01-12 NOTE — Progress Notes (Signed)
Cephas Darby, MD [address]  [place]  [place], [place] [address]  Main: [phone]  Fax: [phone]    Gastroenterology Consultation  Referring Provider:     No ref. provider found Primary Care Physician:  Pcp, No Primary Gastroenterologist:  Dr. Cephas Darby Reason for Consultation:     Dysphagia, GERD        HPI:   Aaron Bowers is a 51 y.o. male referred by Dr. Merryl Hacker, No  for consultation & management of dysphagia.  Patient has been experiencing difficulty swallowing to solids for more than a year and his symptoms have been progressively worsening.  He reports food getting stuck as well as episodes of food impaction where he had to throw it backup or force it to swallow.  He also reports difficulty swallowing to liquids sometimes.  He feels like choking sensation and tightness in his chest.  He does have history of heartburn.  He was taking omeprazole 20 mg daily until 1 year ago.  Recently due to worsening of symptoms, his PCP has started him on omeprazole 40 mg once a day.  His weight is stable.  He does report seasonal allergies.  He is also here to discuss about colonoscopy for colon cancer screening  Follow-up visit 07/08/2020 Patient is here for follow-up of difficulty swallowing.  He underwent upper endoscopy which revealed reflux esophagitis on histology.  There was no evidence of eosinophilic esophagitis.  I started him on omeprazole 40 mg twice daily.  Patient reports that his choking episodes have resolved.  He does report burning sensation in his throat as well as swelling in his throat.  Follow-up visit 01/12/2021 Patient is here for follow-up of GERD and dysphagia.  He reports that his symptoms have almost resolved.  Continues to take omeprazole 40 mg twice daily.  He does not have any concerns today.  NSAIDs: Has been taking ibuprofen 600 mg 3 times daily for his bulge disc for last 3 days  Antiplts/Anticoagulants/Anti thrombotics: None   GI  Procedures:  EGD and colonoscopy 05/13/20 - Normal duodenal bulb and second portion of the duodenum. - Normal stomach. - Esophagogastric landmarks identified. - Esophageal mucosal changes suggestive of eosinophilic esophagitis. Biopsied.  - Two 5 mm polyps in the transverse colon and in the ascending colon, removed with a cold snare. Resected and retrieved. - The examined portion of the ileum was normal. - The distal rectum and anal verge are normal on retroflexion view.  DIAGNOSIS:  A. ESOPHAGUS, DISTAL; COLD BIOPSY:  - REFLUX ESOPHAGITIS.  - NEGATIVE FOR SIGNIFICANTLY INCREASED EOSINOPHILS (FOCALLY UP TO 5 IN A  HIGH POWER FIELD).  - NEGATIVE FOR INTESTINAL METAPLASIA, DYSPLASIA, AND MALIGNANCY.   B. ESOPHAGUS, PROXIMAL; COLD BIOPSY:  - REFLUX ESOPHAGITIS.  - NEGATIVE FOR SIGNIFICANTLY INCREASED EOSINOPHILS (FOCALLY UP TO 5 IN A  HIGH POWER FIELD).  - NEGATIVE FOR INTESTINAL METAPLASIA, DYSPLASIA, AND MALIGNANCY.   C. COLON POLYP, ASCENDING; COLD SNARE:  - TUBULAR ADENOMA.  - NEGATIVE FOR HIGH-GRADE DYSPLASIA AND MALIGNANCY.   D. COLON POLYP, TRANSVERSE; COLD SNARE:  - TUBULAR ADENOMA.  - NEGATIVE FOR HIGH-GRADE DYSPLASIA AND MALIGNANCY.   History reviewed. No pertinent past medical history.  Past Surgical History:  Procedure Laterality Date   COLONOSCOPY WITH PROPOFOL N/A 05/13/2020   Procedure: COLONOSCOPY WITH PROPOFOL;  Surgeon: Lin Landsman, MD;  Location: [facility] ENDOSCOPY;  Service: Gastroenterology;  Laterality: N/A;   ESOPHAGOGASTRODUODENOSCOPY (EGD) WITH PROPOFOL N/A 05/13/2020   Procedure: ESOPHAGOGASTRODUODENOSCOPY (  EGD) WITH PROPOFOL;  Surgeon: Lin Landsman, MD;  Location: [facility] ENDOSCOPY;  Service: Gastroenterology;  Laterality: N/A;    Current Outpatient Medications:    divalproex (DEPAKOTE ER) 500 MG 24 hr tablet, Take 500 mg by mouth 3 (three) times daily., Disp: , Rfl:    lamoTRIgine (LAMICTAL) 150 MG tablet, Take 150 mg by mouth 2 (two) times  daily., Disp: , Rfl:    metaxalone (SKELAXIN) 800 MG tablet, Take 800 mg by mouth 3 (three) times daily as needed., Disp: , Rfl:    omeprazole (PRILOSEC) 40 MG capsule, TAKE 1 CAPSULE (40 MG TOTAL) BY MOUTH 2 (TWO) TIMES DAILY BEFORE A MEAL., Disp: 180 capsule, Rfl: 0   No family history on file.   Social History   Tobacco Use   Smoking status: Never   Smokeless tobacco: Never  Substance Use Topics   Alcohol use: Not Currently   Drug use: Never    Allergies as of 01/12/2021   (No Known Allergies)    Review of Systems:    All systems reviewed and negative except where noted in HPI.   Physical Exam:  BP 136/83 (BP Location: Left Arm, Patient Position: Sitting, Cuff Size: Normal)    Pulse 84    Temp 98.3 F (36.8 C) (Oral)    Ht 5\' 8"  (1.727 m)    Wt 200 lb (90.7 kg)    BMI 30.41 kg/m  No LMP for male patient.  General:   Alert,  Well-developed, well-nourished, pleasant and cooperative in NAD Head:  Normocephalic and atraumatic. Eyes:  Sclera clear, no icterus.   Conjunctiva pink. Ears:  Normal auditory acuity. Nose:  No deformity, discharge, or lesions. Mouth:  No deformity or lesions,oropharynx pink & moist. Neck:  Supple; no masses or thyromegaly. Lungs:  Respirations even and unlabored.  Clear throughout to auscultation.   No wheezes, crackles, or rhonchi. No acute distress. Heart:  Regular rate and rhythm; no murmurs, clicks, rubs, or gallops. Abdomen:  Normal bowel sounds. Soft, non-tender and non-distended without masses, hepatosplenomegaly or hernias noted.  No guarding or rebound tenderness.   Rectal: Not performed Msk:  Symmetrical without gross deformities. Good, equal movement & strength bilaterally. Pulses:  Normal pulses noted. Extremities:  No clubbing or edema.  No cyanosis. Neurologic:  Alert and oriented x3;  grossly normal neurologically. Skin:  Intact without significant lesions or rashes. No jaundice. Psych:  Alert and cooperative. Normal mood and  affect.  Imaging Studies: No abdominal imaging  Assessment and Plan:   Aaron Bowers is a 51 y.o. Caucasian male with history of chronic dysphagia to solids and sometimes to liquids, progressively worse.  Patient underwent EGD, no evidence of eosinophilic esophagitis, proximal and distal esophageal biopsies revealed reflux esophagitis only  Chronic GERD and dysphagia: Symptoms completely resolved Advised patient to decrease omeprazole to 40 mg daily before breakfast or dinner for 1 month.  If his symptoms do not recur, continue once a day for 3 months, then decrease to 20 mg daily.  If his symptoms recur and unable to wean of high-dose PPI, discussed with patient that we have to consider evaluation for antireflux surgery  Reiterated on antireflux lifestyle  Personal history of tubular adenomas of the colon Recommend surveillance colonoscopy in 05/2025   Follow up in 6 months   Cephas Darby, MD

## 2021-02-24 ENCOUNTER — Other Ambulatory Visit: Payer: Self-pay | Admitting: Gastroenterology

## 2021-05-18 ENCOUNTER — Ambulatory Visit: Payer: No Typology Code available for payment source | Admitting: Gastroenterology

## 2021-08-30 ENCOUNTER — Other Ambulatory Visit: Payer: Self-pay

## 2021-08-30 MED ORDER — OMEPRAZOLE 40 MG PO CPDR: 40.0000 mg | DELAYED_RELEASE_CAPSULE | Freq: Two times a day (BID) | ORAL | 0 refills | Status: DC

## 2021-08-30 NOTE — Telephone Encounter (Signed)
Patient has appointment schedule with dr Marius Ditch sent medication for omeprazole to the pharmacy

## 2021-09-29 ENCOUNTER — Encounter: Payer: Self-pay | Admitting: Gastroenterology

## 2021-09-29 ENCOUNTER — Ambulatory Visit: Payer: No Typology Code available for payment source | Admitting: Gastroenterology

## 2021-09-29 VITALS — BP 145/78 | HR 82 | Temp 98.1°F | Ht 68.0 in | Wt 198.5 lb

## 2021-09-29 DIAGNOSIS — R1319 Other dysphagia: Secondary | ICD-10-CM | POA: Diagnosis not present

## 2021-09-29 DIAGNOSIS — K219 Gastro-esophageal reflux disease without esophagitis: Secondary | ICD-10-CM

## 2021-09-29 DIAGNOSIS — K9049 Malabsorption due to intolerance, not elsewhere classified: Secondary | ICD-10-CM | POA: Diagnosis not present

## 2021-09-29 MED ORDER — OMEPRAZOLE 40 MG PO CPDR: 40.0000 mg | DELAYED_RELEASE_CAPSULE | Freq: Every day | ORAL | 0 refills | Status: DC

## 2021-09-29 NOTE — Progress Notes (Signed)
Cephas Darby, MD [address]  [place]  [place], [place] [address]  Main: [phone]  Fax: [phone]    Gastroenterology Consultation  Referring Provider:     No ref. provider found Primary Care Physician:  Pcp, No Primary Gastroenterologist:  Dr. Cephas Darby Reason for Consultation:     Dysphagia, GERD        HPI:   Aaron Bowers is a 51 y.o. male referred by Dr. Merryl Hacker, No  for consultation & management of dysphagia.  Patient has been experiencing difficulty swallowing to solids for more than a year and his symptoms have been progressively worsening.  He reports food getting stuck as well as episodes of food impaction where he had to throw it backup or force it to swallow.  He also reports difficulty swallowing to liquids sometimes.  He feels like choking sensation and tightness in his chest.  He does have history of heartburn.  He was taking omeprazole 20 mg daily until 1 year ago.  Recently due to worsening of symptoms, his PCP has started him on omeprazole 40 mg once a day.  His weight is stable.  He does report seasonal allergies.  He is also here to discuss about colonoscopy for colon cancer screening  Follow-up visit 07/08/2020 Patient is here for follow-up of difficulty swallowing.  He underwent upper endoscopy which revealed reflux esophagitis on histology.  There was no evidence of eosinophilic esophagitis.  I started him on omeprazole 40 mg twice daily.  Patient reports that his choking episodes have resolved.  He does report burning sensation in his throat as well as swelling in his throat.  Follow-up visit 01/12/2021 Patient is here for follow-up of GERD and dysphagia.  He reports that his symptoms have almost resolved.  Continues to take omeprazole 40 mg twice daily.  He does not have any concerns today.  Follow-up visit 09/29/2021 Patient is here for follow-up of dysphagia.  Patient has been on omeprazole 40 mg twice daily until a month ago.  I did not  refill his medication and he reports that gradually he noticed difficulty swallowing, food getting stuck and not passing down smoothly.  He reports that his father had history of food impactions.  NSAIDs: None  Antiplts/Anticoagulants/Anti thrombotics: None   GI Procedures:  EGD and colonoscopy 05/13/20 - Normal duodenal bulb and second portion of the duodenum. - Normal stomach. - Esophagogastric landmarks identified. - Esophageal mucosal changes suggestive of eosinophilic esophagitis. Biopsied.  - Two 5 mm polyps in the transverse colon and in the ascending colon, removed with a cold snare. Resected and retrieved. - The examined portion of the ileum was normal. - The distal rectum and anal verge are normal on retroflexion view.  DIAGNOSIS:  A. ESOPHAGUS, DISTAL; COLD BIOPSY:  - REFLUX ESOPHAGITIS.  - NEGATIVE FOR SIGNIFICANTLY INCREASED EOSINOPHILS (FOCALLY UP TO 5 IN A  HIGH POWER FIELD).  - NEGATIVE FOR INTESTINAL METAPLASIA, DYSPLASIA, AND MALIGNANCY.   B. ESOPHAGUS, PROXIMAL; COLD BIOPSY:  - REFLUX ESOPHAGITIS.  - NEGATIVE FOR SIGNIFICANTLY INCREASED EOSINOPHILS (FOCALLY UP TO 5 IN A  HIGH POWER FIELD).  - NEGATIVE FOR INTESTINAL METAPLASIA, DYSPLASIA, AND MALIGNANCY.   C. COLON POLYP, ASCENDING; COLD SNARE:  - TUBULAR ADENOMA.  - NEGATIVE FOR HIGH-GRADE DYSPLASIA AND MALIGNANCY.   D. COLON POLYP, TRANSVERSE; COLD SNARE:  - TUBULAR ADENOMA.  - NEGATIVE FOR HIGH-GRADE DYSPLASIA AND MALIGNANCY.   History reviewed. No pertinent past medical history.  Past Surgical History:  Procedure Laterality Date   COLONOSCOPY WITH PROPOFOL N/A 05/13/2020   Procedure: COLONOSCOPY WITH PROPOFOL;  Surgeon: Lin Landsman, MD;  Location: [facility] ENDOSCOPY;  Service: Gastroenterology;  Laterality: N/A;   ESOPHAGOGASTRODUODENOSCOPY (EGD) WITH PROPOFOL N/A 05/13/2020   Procedure: ESOPHAGOGASTRODUODENOSCOPY (EGD) WITH PROPOFOL;  Surgeon: Lin Landsman, MD;  Location: [facility] ENDOSCOPY;   Service: Gastroenterology;  Laterality: N/A;    Current Outpatient Medications:    divalproex (DEPAKOTE ER) 500 MG 24 hr tablet, Take 500 mg by mouth 3 (three) times daily., Disp: , Rfl:    lamoTRIgine (LAMICTAL) 150 MG tablet, Take 150 mg by mouth 2 (two) times daily., Disp: , Rfl:    metaxalone (SKELAXIN) 800 MG tablet, Take 800 mg by mouth 3 (three) times daily as needed., Disp: , Rfl:    omeprazole (PRILOSEC) 40 MG capsule, Take 1 capsule (40 mg total) by mouth daily., Disp: 90 capsule, Rfl: 0   No family history on file.   Social History   Tobacco Use   Smoking status: Never   Smokeless tobacco: Never  Substance Use Topics   Alcohol use: Not Currently   Drug use: Never    Allergies as of 09/29/2021   (No Known Allergies)    Review of Systems:    All systems reviewed and negative except where noted in HPI.   Physical Exam:  BP (!) 145/78 (BP Location: Left Arm, Patient Position: Sitting, Cuff Size: Normal)   Pulse 82   Temp 98.1 F (36.7 C) (Oral)   Ht '5\' 8"'$  (1.727 m)   Wt 198 lb 8 oz (90 kg)   BMI 30.18 kg/m  No LMP for male patient.  General:   Alert,  Well-developed, well-nourished, pleasant and cooperative in NAD Head:  Normocephalic and atraumatic. Eyes:  Sclera clear, no icterus.   Conjunctiva pink. Ears:  Normal auditory acuity. Nose:  No deformity, discharge, or lesions. Mouth:  No deformity or lesions,oropharynx pink & moist. Neck:  Supple; no masses or thyromegaly. Lungs:  Respirations even and unlabored.  Clear throughout to auscultation.   No wheezes, crackles, or rhonchi. No acute distress. Heart:  Regular rate and rhythm; no murmurs, clicks, rubs, or gallops. Abdomen:  Normal bowel sounds. Soft, non-tender and non-distended without masses, hepatosplenomegaly or hernias noted.  No guarding or rebound tenderness.   Rectal: Not performed Msk:  Symmetrical without gross deformities. Good, equal movement & strength bilaterally. Pulses:  Normal pulses  noted. Extremities:  No clubbing or edema.  No cyanosis. Neurologic:  Alert and oriented x3;  grossly normal neurologically. Skin:  Intact without significant lesions or rashes. No jaundice. Psych:  Alert and cooperative. Normal mood and affect.  Imaging Studies: No abdominal imaging  Assessment and Plan:   YING ROCKS is a 51 y.o. Caucasian male with history of chronic dysphagia to solids and sometimes to liquids, progressively worse.  Patient underwent EGD, no evidence of eosinophilic esophagitis, proximal and distal esophageal biopsies revealed reflux esophagitis with scant eosinophils only  Chronic GERD and dysphagia: Symptoms completely resolved on high-dose PPI.  However, symptoms recurred when patient is off PPI Recommend to start omeprazole 40 mg daily before breakfast or before dinner for up to 3 months, whenever symptoms resolve, advised him to to decrease to 2 mg once a day   Check food allergy profile and alpha gal panel  Personal history of tubular adenomas of the colon Recommend surveillance colonoscopy in 05/2025   Follow up in 6 months   Cephas Darby, MD

## 2021-09-30 ENCOUNTER — Other Ambulatory Visit: Payer: Self-pay | Admitting: Gastroenterology

## 2021-09-30 ENCOUNTER — Telehealth: Payer: Self-pay

## 2021-09-30 MED ORDER — OMEPRAZOLE 40 MG PO CPDR: 40.0000 mg | DELAYED_RELEASE_CAPSULE | Freq: Every day | ORAL | 0 refills | Status: DC

## 2021-09-30 NOTE — Telephone Encounter (Signed)
[facility] sent a fax that state the prescription they receive had no instructions on it. Resent them the prescription for omeprazole

## 2021-10-02 LAB — FOOD ALLERGY PROFILE
Allergen Corn, IgE: 0.12 kU/L — AB
Clam IgE: <0.1 kU/L
Codfish IgE: <0.1 kU/L
Egg White IgE: 0.15 kU/L — AB
Milk IgE: <0.1 kU/L
Peanut IgE: 0.16 kU/L — AB
Scallop IgE: <0.1 kU/L
Sesame Seed IgE: 0.14 kU/L — AB
Shrimp IgE: 0.83 kU/L — AB
Soybean IgE: <0.1 kU/L
Walnut IgE: <0.1 kU/L
Wheat IgE: 0.12 kU/L — AB

## 2021-10-02 LAB — ALPHA-GAL PANEL
Allergen Lamb IgE: <0.1 kU/L
Beef IgE: <0.1 kU/L
IgE (Immunoglobulin E), Serum: 182 IU/mL (ref 6–495)
O215-IgE Alpha-Gal: <0.1 kU/L
Pork IgE: <0.1 kU/L

## 2021-10-04 ENCOUNTER — Telehealth: Payer: Self-pay

## 2021-10-04 NOTE — Telephone Encounter (Signed)
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  He does not have any intolerance to lamb, beef, pork  RV

## 2021-10-04 NOTE — Telephone Encounter (Signed)
Called and left a message for call back  

## 2021-10-05 NOTE — Telephone Encounter (Signed)
Called and left a message for call back  

## 2021-10-06 NOTE — Telephone Encounter (Signed)
Called and left a message for call back. Mailed letter to call us

## 2022-01-05 ENCOUNTER — Ambulatory Visit: Payer: No Typology Code available for payment source | Admitting: Gastroenterology

## 2022-09-13 ENCOUNTER — Inpatient Hospital Stay
Admission: EM | Admit: 2022-09-13 | Discharge: 2022-09-15 | DRG: 321 | Disposition: A | Discharge status: Home / Self Care | Payer: 59 | Attending: Internal Medicine | Admitting: Internal Medicine

## 2022-09-13 ENCOUNTER — Emergency Department: Payer: 59

## 2022-09-13 ENCOUNTER — Encounter
Admission: EM | Disposition: A | Discharge status: Home / Self Care | Payer: Self-pay | Source: Home / Self Care | Attending: Internal Medicine

## 2022-09-13 ENCOUNTER — Other Ambulatory Visit: Payer: Self-pay

## 2022-09-13 DIAGNOSIS — I214 Non-ST elevation (NSTEMI) myocardial infarction: Principal | ICD-10-CM | POA: Diagnosis present

## 2022-09-13 DIAGNOSIS — I959 Hypotension, unspecified: Secondary | ICD-10-CM | POA: Diagnosis not present

## 2022-09-13 DIAGNOSIS — Z7984 Long term (current) use of oral hypoglycemic drugs: Secondary | ICD-10-CM

## 2022-09-13 DIAGNOSIS — R000 Tachycardia, unspecified: Secondary | ICD-10-CM | POA: Diagnosis present

## 2022-09-13 DIAGNOSIS — F410 Panic disorder [episodic paroxysmal anxiety] without agoraphobia: Secondary | ICD-10-CM | POA: Diagnosis present

## 2022-09-13 DIAGNOSIS — E782 Mixed hyperlipidemia: Secondary | ICD-10-CM | POA: Diagnosis present

## 2022-09-13 DIAGNOSIS — K219 Gastro-esophageal reflux disease without esophagitis: Secondary | ICD-10-CM | POA: Insufficient documentation

## 2022-09-13 DIAGNOSIS — Z23 Encounter for immunization: Secondary | ICD-10-CM

## 2022-09-13 DIAGNOSIS — I255 Ischemic cardiomyopathy: Secondary | ICD-10-CM

## 2022-09-13 DIAGNOSIS — E1169 Type 2 diabetes mellitus with other specified complication: Secondary | ICD-10-CM

## 2022-09-13 DIAGNOSIS — F319 Bipolar disorder, unspecified: Secondary | ICD-10-CM | POA: Diagnosis present

## 2022-09-13 DIAGNOSIS — Z820 Family history of epilepsy and other diseases of the nervous system: Secondary | ICD-10-CM | POA: Diagnosis not present

## 2022-09-13 DIAGNOSIS — E119 Type 2 diabetes mellitus without complications: Secondary | ICD-10-CM

## 2022-09-13 DIAGNOSIS — Z79899 Other long term (current) drug therapy: Secondary | ICD-10-CM | POA: Diagnosis not present

## 2022-09-13 DIAGNOSIS — E1165 Type 2 diabetes mellitus with hyperglycemia: Secondary | ICD-10-CM | POA: Diagnosis present

## 2022-09-13 DIAGNOSIS — Z7902 Long term (current) use of antithrombotics/antiplatelets: Secondary | ICD-10-CM

## 2022-09-13 DIAGNOSIS — Z955 Presence of coronary angioplasty implant and graft: Secondary | ICD-10-CM

## 2022-09-13 DIAGNOSIS — I340 Nonrheumatic mitral (valve) insufficiency: Secondary | ICD-10-CM | POA: Diagnosis present

## 2022-09-13 DIAGNOSIS — Z833 Family history of diabetes mellitus: Secondary | ICD-10-CM

## 2022-09-13 DIAGNOSIS — R7989 Other specified abnormal findings of blood chemistry: Secondary | ICD-10-CM | POA: Insufficient documentation

## 2022-09-13 DIAGNOSIS — I2510 Atherosclerotic heart disease of native coronary artery without angina pectoris: Secondary | ICD-10-CM

## 2022-09-13 DIAGNOSIS — I5021 Acute systolic (congestive) heart failure: Secondary | ICD-10-CM | POA: Diagnosis present

## 2022-09-13 DIAGNOSIS — E878 Other disorders of electrolyte and fluid balance, not elsewhere classified: Secondary | ICD-10-CM | POA: Diagnosis present

## 2022-09-13 DIAGNOSIS — I25110 Atherosclerotic heart disease of native coronary artery with unstable angina pectoris: Secondary | ICD-10-CM | POA: Diagnosis not present

## 2022-09-13 HISTORY — DX: Bipolar disorder, unspecified: F31.9

## 2022-09-13 HISTORY — PX: LEFT HEART CATH AND CORONARY ANGIOGRAPHY: CATH118249

## 2022-09-13 HISTORY — DX: Hyperlipidemia, unspecified: E78.5

## 2022-09-13 HISTORY — PX: CORONARY STENT INTERVENTION: CATH118234

## 2022-09-13 LAB — COMPREHENSIVE METABOLIC PANEL
ALT: 22 U/L (ref 0–44)
AST: 49 U/L — ABNORMAL HIGH (ref 15–41)
Albumin: 4 g/dL (ref 3.5–5.0)
Alkaline Phosphatase: 89 U/L (ref 38–126)
Anion gap: 14 (ref 5–15)
BUN: 9 mg/dL (ref 6–20)
CO2: 23 mmol/L (ref 22–32)
Calcium: 9 mg/dL (ref 8.9–10.3)
Chloride: 97 mmol/L — ABNORMAL LOW (ref 98–111)
Creatinine, Ser: 0.86 mg/dL (ref 0.61–1.24)
GFR, Estimated: >60 mL/min (ref >60)
Glucose, Bld: 343 mg/dL — ABNORMAL HIGH (ref 70–99)
Potassium: 3.8 mmol/L (ref 3.5–5.1)
Sodium: 134 mmol/L — ABNORMAL LOW (ref 135–145)
Total Bilirubin: 0.6 mg/dL (ref 0.3–1.2)
Total Protein: 7.7 g/dL (ref 6.5–8.1)

## 2022-09-13 LAB — CBC
HCT: 42 % (ref 39.0–52.0)
Hemoglobin: 15.2 g/dL (ref 13.0–17.0)
MCH: 31.2 pg (ref 26.0–34.0)
MCHC: 36.2 g/dL — ABNORMAL HIGH (ref 30.0–36.0)
MCV: 86.2 fL (ref 80.0–100.0)
Platelets: 285 10*3/uL (ref 150–400)
RBC: 4.87 MIL/uL (ref 4.22–5.81)
RDW: 11.9 % (ref 11.5–15.5)
WBC: 7.3 10*3/uL (ref 4.0–10.5)
nRBC: 0 % (ref 0.0–0.2)

## 2022-09-13 LAB — POCT ACTIVATED CLOTTING TIME
Activated Clotting Time: 262 s
Activated Clotting Time: 275 s

## 2022-09-13 LAB — TROPONIN I (HIGH SENSITIVITY)
Troponin I (High Sensitivity): 1371 ng/L (ref <18)
Troponin I (High Sensitivity): 10001 ng/L (ref <18)

## 2022-09-13 LAB — GLUCOSE, CAPILLARY: Glucose-Capillary: 215 mg/dL — ABNORMAL HIGH (ref 70–99)

## 2022-09-13 SURGERY — LEFT HEART CATH AND CORONARY ANGIOGRAPHY
Anesthesia: Moderate Sedation

## 2022-09-13 MED ORDER — LABETALOL HCL 5 MG/ML IV SOLN: 10.0000 mg | INTRAVENOUS | Status: AC | PRN

## 2022-09-13 MED ORDER — IOHEXOL 300 MG/ML  SOLN
INTRAMUSCULAR | Status: DC | PRN
  Administered 2022-09-13: 140 mL

## 2022-09-13 MED ORDER — SODIUM CHLORIDE 0.9 % IV SOLN: INTRAVENOUS | Status: DC

## 2022-09-13 MED ORDER — MIDAZOLAM HCL 2 MG/2ML IJ SOLN
INTRAMUSCULAR | Status: DC | PRN
  Administered 2022-09-13: 1 mg via INTRAVENOUS

## 2022-09-13 MED ORDER — CHLORHEXIDINE GLUCONATE CLOTH 2 % EX PADS
6.0000 | MEDICATED_PAD | Freq: Every day | CUTANEOUS | Status: DC
  Administered 2022-09-13 – 2022-09-14 (×2): 6 via TOPICAL

## 2022-09-13 MED ORDER — SODIUM CHLORIDE 0.9% FLUSH
3.0000 mL | Freq: Two times a day (BID) | INTRAVENOUS | Status: DC
  Administered 2022-09-13 – 2022-09-15 (×4): 3 mL via INTRAVENOUS

## 2022-09-13 MED ORDER — TIROFIBAN HCL IV 12.5 MG/250 ML
INTRAVENOUS | Status: AC | PRN
  Administered 2022-09-13: .15 ug/kg/min via INTRAVENOUS

## 2022-09-13 MED ORDER — ASPIRIN 81 MG PO CHEW
81.0000 mg | CHEWABLE_TABLET | ORAL | Status: AC
  Administered 2022-09-14: 81 mg via ORAL
  Filled 2022-09-13: qty 1

## 2022-09-13 MED ORDER — LIDOCAINE HCL (PF) 1 % IJ SOLN
INTRAMUSCULAR | Status: DC | PRN
  Administered 2022-09-13: 5 mL

## 2022-09-13 MED ORDER — PRASUGREL HCL 10 MG PO TABS
ORAL_TABLET | ORAL | Status: DC | PRN
  Administered 2022-09-13: 60 mg via ORAL

## 2022-09-13 MED ORDER — TIROFIBAN (AGGRASTAT) BOLUS VIA INFUSION
INTRAVENOUS | Status: DC | PRN
  Administered 2022-09-13: 2267.5 ug via INTRAVENOUS

## 2022-09-13 MED ORDER — TIROFIBAN HCL IV 12.5 MG/250 ML
INTRAVENOUS | Status: AC
  Filled 2022-09-13: qty 250

## 2022-09-13 MED ORDER — HEPARIN (PORCINE) 25000 UT/250ML-% IV SOLN
1200.0000 [IU]/h | INTRAVENOUS | Status: DC
  Administered 2022-09-13: 1200 [IU]/h via INTRAVENOUS
  Filled 2022-09-13: qty 250

## 2022-09-13 MED ORDER — MORPHINE SULFATE (PF) 2 MG/ML IV SOLN
INTRAVENOUS | Status: AC
  Administered 2022-09-13: 2 mg via INTRAVENOUS
  Filled 2022-09-13: qty 1

## 2022-09-13 MED ORDER — HEPARIN (PORCINE) IN NACL 1000-0.9 UT/500ML-% IV SOLN
INTRAVENOUS | Status: DC | PRN
  Administered 2022-09-13: 500 mL

## 2022-09-13 MED ORDER — MORPHINE SULFATE (PF) 2 MG/ML IV SOLN
2.0000 mg | INTRAVENOUS | Status: AC | PRN
  Administered 2022-09-13 (×2): 2 mg via INTRAVENOUS
  Filled 2022-09-13 (×2): qty 1

## 2022-09-13 MED ORDER — MIDAZOLAM HCL 2 MG/2ML IJ SOLN
INTRAMUSCULAR | Status: AC
  Filled 2022-09-13: qty 2

## 2022-09-13 MED ORDER — HEPARIN (PORCINE) IN NACL 2000-0.9 UNIT/L-% IV SOLN
INTRAVENOUS | Status: DC | PRN
  Administered 2022-09-13: 1000 mL

## 2022-09-13 MED ORDER — PRASUGREL HCL 10 MG PO TABS
ORAL_TABLET | ORAL | Status: AC
  Filled 2022-09-13: qty 1

## 2022-09-13 MED ORDER — ASPIRIN 81 MG PO CHEW
81.0000 mg | CHEWABLE_TABLET | Freq: Every day | ORAL | Status: DC
  Administered 2022-09-15: 81 mg via ORAL
  Filled 2022-09-13 (×2): qty 1

## 2022-09-13 MED ORDER — FENTANYL CITRATE (PF) 100 MCG/2ML IJ SOLN
INTRAMUSCULAR | Status: AC
  Filled 2022-09-13: qty 2

## 2022-09-13 MED ORDER — HEPARIN (PORCINE) IN NACL 1000-0.9 UT/500ML-% IV SOLN
INTRAVENOUS | Status: AC
  Filled 2022-09-13: qty 500

## 2022-09-13 MED ORDER — HEPARIN (PORCINE) IN NACL 1000-0.9 UT/500ML-% IV SOLN
INTRAVENOUS | Status: AC
  Filled 2022-09-13: qty 1000

## 2022-09-13 MED ORDER — NITROGLYCERIN 1 MG/10 ML FOR IR/CATH LAB
INTRA_ARTERIAL | Status: AC
  Filled 2022-09-13: qty 10

## 2022-09-13 MED ORDER — HEPARIN SODIUM (PORCINE) 1000 UNIT/ML IJ SOLN
INTRAMUSCULAR | Status: AC
  Filled 2022-09-13: qty 10

## 2022-09-13 MED ORDER — LIDOCAINE HCL 1 % IJ SOLN
INTRAMUSCULAR | Status: AC
  Filled 2022-09-13: qty 20

## 2022-09-13 MED ORDER — NITROGLYCERIN 1 MG/10 ML FOR IR/CATH LAB
INTRA_ARTERIAL | Status: DC | PRN
  Administered 2022-09-13 (×3): 200 ug
  Administered 2022-09-13: 100 ug

## 2022-09-13 MED ORDER — VERAPAMIL HCL 2.5 MG/ML IV SOLN
INTRAVENOUS | Status: AC
  Filled 2022-09-13: qty 2

## 2022-09-13 MED ORDER — MORPHINE SULFATE (PF) 2 MG/ML IV SOLN: 2.0000 mg | INTRAVENOUS | Status: DC | PRN

## 2022-09-13 MED ORDER — SODIUM CHLORIDE 0.9 % IV SOLN: 250.0000 mL | INTRAVENOUS | Status: DC | PRN

## 2022-09-13 MED ORDER — ADENOSINE 6 MG/2ML IV SOLN
INTRAVENOUS | Status: AC
  Filled 2022-09-13: qty 2

## 2022-09-13 MED ORDER — TIROFIBAN HCL IN NACL 5-0.9 MG/100ML-% IV SOLN
0.1500 ug/kg/min | INTRAVENOUS | Status: AC
  Administered 2022-09-13: 0.15 ug/kg/min via INTRAVENOUS
  Filled 2022-09-13: qty 100

## 2022-09-13 MED ORDER — ONDANSETRON HCL 4 MG/2ML IJ SOLN: 4.0000 mg | Freq: Four times a day (QID) | INTRAMUSCULAR | Status: DC | PRN

## 2022-09-13 MED ORDER — PRASUGREL HCL 10 MG PO TABS
10.0000 mg | ORAL_TABLET | Freq: Every day | ORAL | Status: DC
  Administered 2022-09-14 – 2022-09-15 (×2): 10 mg via ORAL
  Filled 2022-09-13 (×3): qty 1

## 2022-09-13 MED ORDER — SODIUM CHLORIDE 0.9% FLUSH: 3.0000 mL | INTRAVENOUS | Status: DC | PRN

## 2022-09-13 MED ORDER — INSULIN ASPART 100 UNIT/ML IJ SOLN
0.0000 [IU] | Freq: Every day | INTRAMUSCULAR | Status: DC
  Administered 2022-09-13: 2 [IU] via SUBCUTANEOUS
  Filled 2022-09-13: qty 1

## 2022-09-13 MED ORDER — HEPARIN SODIUM (PORCINE) 1000 UNIT/ML IJ SOLN
INTRAMUSCULAR | Status: DC | PRN
  Administered 2022-09-13: 3000 [IU] via INTRAVENOUS
  Administered 2022-09-13: 4500 [IU] via INTRAVENOUS

## 2022-09-13 MED ORDER — PRASUGREL HCL 10 MG PO TABS
ORAL_TABLET | ORAL | Status: AC
  Filled 2022-09-13: qty 6

## 2022-09-13 MED ORDER — SODIUM CHLORIDE 0.9 % IV SOLN: INTRAVENOUS | Status: AC

## 2022-09-13 MED ORDER — HEPARIN SODIUM (PORCINE) 5000 UNIT/ML IJ SOLN
4000.0000 [IU] | Freq: Once | INTRAMUSCULAR | Status: AC
  Administered 2022-09-13: 4000 [IU] via INTRAVENOUS
  Filled 2022-09-13: qty 1

## 2022-09-13 MED ORDER — ACETAMINOPHEN 325 MG PO TABS
650.0000 mg | ORAL_TABLET | ORAL | Status: DC | PRN
  Administered 2022-09-14 (×2): 650 mg via ORAL
  Filled 2022-09-13 (×2): qty 2

## 2022-09-13 MED ORDER — FENTANYL CITRATE (PF) 100 MCG/2ML IJ SOLN
INTRAMUSCULAR | Status: DC | PRN
  Administered 2022-09-13 (×3): 25 ug via INTRAVENOUS

## 2022-09-13 MED ORDER — ATORVASTATIN CALCIUM 80 MG PO TABS
80.0000 mg | ORAL_TABLET | Freq: Every day | ORAL | Status: DC
  Administered 2022-09-13 – 2022-09-15 (×3): 80 mg via ORAL
  Filled 2022-09-13: qty 1
  Filled 2022-09-13: qty 4
  Filled 2022-09-13: qty 1
  Filled 2022-09-13: qty 4
  Filled 2022-09-13: qty 1

## 2022-09-13 MED ORDER — HYDRALAZINE HCL 20 MG/ML IJ SOLN: 10.0000 mg | INTRAMUSCULAR | Status: AC | PRN

## 2022-09-13 MED ORDER — INSULIN ASPART 100 UNIT/ML IJ SOLN
0.0000 [IU] | Freq: Three times a day (TID) | INTRAMUSCULAR | Status: DC
  Administered 2022-09-14: 2 [IU] via SUBCUTANEOUS
  Administered 2022-09-14: 8 [IU] via SUBCUTANEOUS
  Administered 2022-09-14 – 2022-09-15 (×2): 3 [IU] via SUBCUTANEOUS
  Filled 2022-09-13 (×4): qty 1

## 2022-09-13 MED ORDER — INFLUENZA VIRUS VACC SPLIT PF (FLUZONE) 0.5 ML IM SUSY
0.5000 mL | PREFILLED_SYRINGE | INTRAMUSCULAR | Status: AC
  Administered 2022-09-14: 0.5 mL via INTRAMUSCULAR
  Filled 2022-09-13: qty 0.5

## 2022-09-13 MED ORDER — ENOXAPARIN SODIUM 40 MG/0.4ML IJ SOSY
40.0000 mg | PREFILLED_SYRINGE | INTRAMUSCULAR | Status: DC
  Administered 2022-09-14 – 2022-09-15 (×2): 40 mg via SUBCUTANEOUS
  Filled 2022-09-13 (×2): qty 0.4

## 2022-09-13 MED ORDER — VERAPAMIL HCL 2.5 MG/ML IV SOLN
INTRAVENOUS | Status: DC | PRN
  Administered 2022-09-13 (×2): 2.5 mg via INTRAVENOUS

## 2022-09-13 MED ORDER — METOPROLOL SUCCINATE ER 25 MG PO TB24
12.5000 mg | ORAL_TABLET | Freq: Every day | ORAL | Status: DC
  Administered 2022-09-13 – 2022-09-14 (×2): 12.5 mg via ORAL
  Filled 2022-09-13: qty 1
  Filled 2022-09-13: qty 0.5

## 2022-09-13 MED ORDER — ADENOSINE 6 MG/2ML IV SOLN
INTRAVENOUS | Status: DC | PRN
  Administered 2022-09-13 (×3): 120 ug via INTRAVENOUS

## 2022-09-13 SURGICAL SUPPLY — 21 items
BALLN EUPHORA RX 2.0X12 (BALLOONS) ×1
BALLN TREK RX 2.25X12 (BALLOONS) ×1
BALLN ~~LOC~~ TREK NEO RX 3.0X20 (BALLOONS) ×1
BALLOON EUPHORA RX 2.0X12 (BALLOONS) IMPLANT
BALLOON TREK RX 2.25X12 (BALLOONS) IMPLANT
BALLOON ~~LOC~~ TREK NEO RX 3.0X20 (BALLOONS) IMPLANT
CATH INFINITI JR4 5F (CATHETERS) IMPLANT
CATH LAUNCHER 6FR EBU3.5 (CATHETERS) IMPLANT
DEVICE RAD TR BAND REGULAR (VASCULAR PRODUCTS) IMPLANT
DRAPE BRACHIAL (DRAPES) IMPLANT
GLIDESHEATH SLEND SS 6F .021 (SHEATH) IMPLANT
GUIDEWIRE INQWIRE 1.5J.035X260 (WIRE) IMPLANT
INQWIRE 1.5J .035X260CM (WIRE) ×1
KIT ENCORE 26 ADVANTAGE (KITS) IMPLANT
PACK CARDIAC CATH (CUSTOM PROCEDURE TRAY) ×1 IMPLANT
PROTECTION STATION PRESSURIZED (MISCELLANEOUS) ×1
SET ATX-X65L (MISCELLANEOUS) IMPLANT
STATION PROTECTION PRESSURIZED (MISCELLANEOUS) IMPLANT
STENT ONYX FRONTIER 2.75X38 (Permanent Stent) IMPLANT
WIRE G HI TQ BMW 190 (WIRE) IMPLANT
WIRE RUNTHROUGH .014X180CM (WIRE) IMPLANT

## 2022-09-13 NOTE — Assessment & Plan Note (Signed)
-   The patient be admitted to an ICU bed. - He replaced on as needed IV morphine sulfate and sublingual nitroglycerin from pain. - He will be on aspirin and Aggrastat. - We will continue him on high-dose statin with Lipitor.

## 2022-09-13 NOTE — H&P (Signed)
[place]   PATIENT NAME: Aaron Bowers    MR#:  [id number]  DATE OF BIRTH:  09-19-1970  DATE OF ADMISSION:  09/13/2022  PRIMARY CARE PHYSICIAN: Pcp, No   Patient is coming from: Home  REQUESTING/REFERRING PHYSICIAN: End, Cristal Deer, MD  CHIEF COMPLAINT:   Chief Complaint  Patient presents with   Chest Pain    HISTORY OF PRESENT ILLNESS:  Aaron Bowers is a 52 y.o. Caucasian male with medical history significant for bipolar disorder, borderline type 2 diabetes mellitus and dyslipidemia, who presented to the emergency room with acute onset of left parasternal chest pain felt as tightness and moderate to severe intensity with no radiation and no nausea or vomiting or diaphoresis.  He had associated palpitations.  No cough or wheezing or hemoptysis.  No fever or chills.  ED Course: Upon presentation to the emergency room, BP was 153/110 with heart rate of 133 and respiratory rate of 24 with pulse oximetry of 97% on room air and temperature 97.7.  Labs revealed mild hyponatremia and hypochloremia and hyperglycemia of 343 with AST of 49.  High sensitive troponin I was 1371 and later 10,000 and CBC was within normal. EKG as reviewed by me : EKG showed sinus tachycardia with rate of 132 with possible left atrial enlargement, poor R wave progression, J-point elevation in V2 and T wave inversion anteroseptally and laterally. Imaging: Portable chest x-ray low lung volumes with no acute cardiopulmonary disease.  The patient was initially given 4000 units of IV heparin.  He seems to for review aspirin earlier today.  He was given sublingual nitroglycerin and Nitropaste by EMS.  Due to persistent and worsening chest pain the patient was taken by Dr. Okey Dupre to the Cath Lab and found to have a significant mid LAD occlusion that was stented and underwent balloon angioplasty of the second diagonal branch.  Is admitted to the ICU for further evaluation and management. PAST MEDICAL HISTORY:    Past Medical History:  Diagnosis Date   Bipolar disorder (HCC)    Borderline diabetes    Borderline hyperlipidemia     PAST SURGICAL HISTORY:   Past Surgical History:  Procedure Laterality Date   COLONOSCOPY WITH PROPOFOL N/A 05/13/2020   Procedure: COLONOSCOPY WITH PROPOFOL;  Surgeon: Toney Reil, MD;  Location: [facility] ENDOSCOPY;  Service: Gastroenterology;  Laterality: N/A;   ESOPHAGOGASTRODUODENOSCOPY (EGD) WITH PROPOFOL N/A 05/13/2020   Procedure: ESOPHAGOGASTRODUODENOSCOPY (EGD) WITH PROPOFOL;  Surgeon: Toney Reil, MD;  Location: [facility] ENDOSCOPY;  Service: Gastroenterology;  Laterality: N/A;    SOCIAL HISTORY:   Social History   Tobacco Use   Smoking status: Never   Smokeless tobacco: Never  Substance Use Topics   Alcohol use: Not Currently    FAMILY HISTORY:  Positive for MI, Parkinson's disease, cancer and diabetes.  DRUG ALLERGIES:  No Known Allergies  REVIEW OF SYSTEMS:   ROS As per history of present illness. All pertinent systems were reviewed above. Constitutional, HEENT, cardiovascular, respiratory, GI, GU, musculoskeletal, neuro, psychiatric, endocrine, integumentary and hematologic systems were reviewed and are otherwise negative/unremarkable except for positive findings mentioned above in the HPI.   MEDICATIONS AT HOME:   Prior to Admission medications   Medication Sig Start Date End Date Taking? Authorizing Provider  divalproex (DEPAKOTE ER) 500 MG 24 hr tablet Take 500 mg by mouth 3 (three) times daily. 04/14/20   [provider]  lamoTRIgine (LAMICTAL) 150 MG tablet Take 150 mg by mouth 2 (two) times daily.  04/14/20   [provider]  metaxalone (SKELAXIN) 800 MG tablet Take 800 mg by mouth 3 (three) times daily as needed. 04/14/20   [provider]  omeprazole (PRILOSEC) 40 MG capsule Take 1 capsule (40 mg total) by mouth daily before breakfast. 09/30/21   Vanga, Loel Dubonnet, MD      VITAL SIGNS:  Blood  pressure 108/78, pulse 88, temperature 97.7 F (36.5 C), temperature source Oral, resp. rate 14, height 5\' 8"  (1.727 m), weight 87.7 kg, SpO2 94%.  PHYSICAL EXAMINATION:  Physical Exam  GENERAL:  52 y.o.-year-old Caucasian male patient lying in the bed with no acute distress.  EYES: Pupils equal, round, reactive to light and accommodation. No scleral icterus. Extraocular muscles intact.  HEENT: Head atraumatic, normocephalic. Oropharynx and nasopharynx clear.  NECK:  Supple, no jugular venous distention. No thyroid enlargement, no tenderness.  LUNGS: Normal breath sounds bilaterally, no wheezing, rales,rhonchi or crepitation. No use of accessory muscles of respiration.  CARDIOVASCULAR: Regular rate and rhythm, S1, S2 normal. No murmurs, rubs, or gallops.  ABDOMEN: Soft, nondistended, nontender. Bowel sounds present. No organomegaly or mass.  EXTREMITIES: No pedal edema, cyanosis, or clubbing.  NEUROLOGIC: Cranial nerves II through XII are intact. Muscle strength 5/5 in all extremities. Sensation intact. Gait not checked.  PSYCHIATRIC: The patient is alert and oriented x 3.  Normal affect and good eye contact. SKIN: No obvious rash, lesion, or ulcer.   LABORATORY PANEL:   CBC Recent Labs  Lab 09/13/22 1726  WBC 7.3  HGB 15.2  HCT 42.0  PLT 285   ------------------------------------------------------------------------------------------------------------------  Chemistries  Recent Labs  Lab 09/13/22 1726  NA 134*  K 3.8  CL 97*  CO2 23  GLUCOSE 343*  BUN 9  CREATININE 0.86  CALCIUM 9.0  AST 49*  ALT 22  ALKPHOS 89  BILITOT 0.6   ------------------------------------------------------------------------------------------------------------------  Cardiac Enzymes No results for input(s): "TROPONINI" in the last 168 hours. ------------------------------------------------------------------------------------------------------------------  RADIOLOGY:  CARDIAC  CATHETERIZATION  Result Date: 09/13/2022 Conclusions: Severe single-vessel coronary artery disease with 99% stenosis of mid LAD involving ostium of moderate-caliber D2 branch.  No significant disease noted in dominant LCx or small RCA. Moderately to severely reduced left ventricular systolic function (LVEF 30-35%) with normal filling pressure (LVEDP 10 mmHg). Successful PCI to mid LAD using Onyx Frontier 2.75 x 38 mm drug-eluting stent (postdilated to 3.1 mm) and angioplasty of jailed D2 branch using 2.0 mm balloon.  Final angiogram shows 0% residual stenosis in the LAD and 30% residual stenosis at the ostium of D2.  There is TIMI-3 flow throughout the left coronary artery. Recommendations: Continue tirofiban infusion for 4 hours. Dual antiplatelet therapy with aspirin and prasugrel for at least 12 months. Aggressive secondary prevention of coronary artery disease. Obtain echocardiogram.  If LVEF confirmed to be less than 35%, consider LifeVest at discharge. [name], MD [facility]  DG Chest Portable 1 View  Result Date: 09/13/2022 CLINICAL DATA:  Chest pain EXAM: PORTABLE CHEST 1 VIEW COMPARISON:  11/02/2006 FINDINGS: Low lung volumes are present, causing crowding of the pulmonary vasculature. The lungs appear clear. Cardiac and mediastinal contours normal. No blunting of the costophrenic angles. No significant bony abnormality observed. IMPRESSION: 1. Low lung volumes. No active cardiopulmonary disease is radiographically apparent. Electronically Signed   By: Gaylyn Rong M.D.   On: 09/13/2022 18:34      IMPRESSION AND PLAN:  Assessment and Plan: NSTEMI (non-ST elevated myocardial infarction) [facility]) - The patient be admitted to an ICU  bed. - He replaced on as needed IV morphine sulfate and sublingual nitroglycerin from pain. - He will be on aspirin and Aggrastat. - We will continue him on high-dose statin with Lipitor.  Uncontrolled type 2 diabetes mellitus with hyperglycemia,  without long-term current use of insulin (HCC) - The patient will be placed on supplemental coverage with NovoLog. - Will check hemoglobin A1c. - He will receive further diabetic education.  Bipolar 1 disorder (HCC) - We will continue Depakote ER and Lamictal.  GERD without esophagitis - Continue PPI therapy.   DVT prophylaxis: Lovenox.  Advanced Care Planning:  Code Status: full code.  Family Communication:  The plan of care was discussed in details with the patient (and family). I answered all questions. The patient agreed to proceed with the above mentioned plan. Further management will depend upon hospital course. Disposition Plan: Back to previous home environment Consults called: Cardiology All the records are reviewed and case discussed with ED provider.  Status is: Inpatient   At the time of the admission, it appears that the appropriate admission status for this patient is inpatient.  This is judged to be reasonable and necessary in order to provide the required intensity of service to ensure the patient's safety given the presenting symptoms, physical exam findings and initial radiographic and laboratory data in the context of comorbid conditions.  The patient requires inpatient status due to high intensity of service, high risk of further deterioration and high frequency of surveillance required.  I certify that at the time of admission, it is my clinical judgment that the patient will require inpatient hospital care extending more than 2 midnights.                            Dispo: The patient is from: Home              Anticipated d/c is to: Home              Patient currently is not medically stable to d/c.              Difficult to place patient: No  Hannah Beat M.D on 09/13/2022 at 11:20 PM  [facility]   From 7 PM-7 AM, contact night-coverage [email or web address]  CC: Primary care physician; Pcp, No

## 2022-09-13 NOTE — ED Notes (Signed)
Patient rating chest pain 7/10 and requesting paid medications; MD End made aware.

## 2022-09-13 NOTE — Assessment & Plan Note (Signed)
Continue PPI therapy. 

## 2022-09-13 NOTE — Consult Note (Signed)
ANTICOAGULATION CONSULT NOTE - Initial Consult  Pharmacy Consult for heparin infusion Indication: chest pain/ACS  Not on File  Patient Measurements: Height: 5\' 8"  (172.7 cm) Weight: 90.7 kg (200 lb) IBW/kg (Calculated) : 68.4 Heparin Dosing Weight: 90.7 kg  Vital Signs: Temp: 97.7 F (36.5 C) (09/10 1716) Temp Source: Oral (09/10 1716) BP: 153/110 (09/10 1716) Pulse Rate: 133 (09/10 1716)  Labs: No results for input(s): "HGB", "HCT", "PLT", "APTT", "LABPROT", "INR", "HEPARINUNFRC", "HEPRLOWMOCWT", "CREATININE", "CKTOTAL", "CKMB", "TROPONINIHS" in the last 72 hours.  CrCl cannot be calculated (No successful lab value found.).   Medical History: History reviewed. No pertinent past medical history.  Medications:  No prior anticoagulation noted   Assessment: 53 y.o. male with a past medical history of DM2, boarderline HLD, presents to the emergency department for chest pain. PT reports intermittent severe chest pain radiating to the left arm with minimal activity and at rest.  Plan for urgent cardiac catherization. Pharmacy has been consulted to initiate and manage IV heparin therapy.     Goal of Therapy:  Heparin level 0.3-0.7 units/ml Monitor platelets by anticoagulation protocol: Yes   Plan:  Give 4000 units bolus x 1 Start heparin infusion at 1200 units/hr Check anti-Xa level in 6 hours and daily while on heparin Continue to monitor H&H and platelets  Sharen Hones, PharmD, BCPS Clinical Pharmacist   09/13/2022,5:24 PM

## 2022-09-13 NOTE — Consult Note (Signed)
Cardiology Consultation   Patient ID: BENTZION SAXBY MRN: [id number]; DOB: June 22, 1970  Admit date: 09/13/2022 Date of Consult: 09/13/2022  PCP:  Oneita Hurt, No   [facility] Providers Cardiologist:  New - Kerri-Anne Haeberle   Patient Profile:   Aaron Bowers is a 52 y.o. male with a hx of borderline diabetes and borderline hyperlipidemia as well as bipolar disorder, who is being seen 09/13/2022 for the evaluation of chest pain and abnormal EKG at the request of Dr. Lenard Lance.  History of Present Illness:   Aaron Bowers reports that he has been having intermittent exertional chest discomfort with strenuous activities over the last few months.  However, this has progressively worsened over the last few weeks.  Beginning yesterday, he has had intermittent severe chest pain radiating to the left arm with minimal activity and at rest.  He describes the pain in his chest as a sharp, stabbing discomfort.  Pain in the arm is primarily located just above the wrist and seems to worsen with the chest pain.  He took 4 baby aspirin earlier today with modest improvement in the pain.  He summoned EMS and was found to have subtle anterior and lateral ST segment elevation.  He was given some Ingal and transdermal nitroglycerin with modest improvement in his chest pain.  It was initially 5/10 in intensity on my evaluation but increased to 9/10 during the interview.  Aaron Bowers denies a history of heart disease himself but notes that his mother had CABG in her 62s or 86s.  She also had several blood clots (he believes they were DVTs).  In the ED, he has received heparin bolus and is currently on a heparin infusion.   Past Medical History:  Diagnosis Date   Bipolar disorder (HCC)    Borderline diabetes    Borderline hyperlipidemia     Past Surgical History:  Procedure Laterality Date   COLONOSCOPY WITH PROPOFOL N/A 05/13/2020   Procedure: COLONOSCOPY WITH PROPOFOL;  Surgeon: Toney Reil, MD;   Location: [facility] ENDOSCOPY;  Service: Gastroenterology;  Laterality: N/A;   ESOPHAGOGASTRODUODENOSCOPY (EGD) WITH PROPOFOL N/A 05/13/2020   Procedure: ESOPHAGOGASTRODUODENOSCOPY (EGD) WITH PROPOFOL;  Surgeon: Toney Reil, MD;  Location: [facility] ENDOSCOPY;  Service: Gastroenterology;  Laterality: N/A;     Home Medications:  Prior to Admission medications   Medication Sig Start Date Emeri Estill Date Taking? Authorizing Provider  divalproex (DEPAKOTE ER) 500 MG 24 hr tablet Take 500 mg by mouth 3 (three) times daily. 04/14/20   [provider]  lamoTRIgine (LAMICTAL) 150 MG tablet Take 150 mg by mouth 2 (two) times daily. 04/14/20   [provider]  metaxalone (SKELAXIN) 800 MG tablet Take 800 mg by mouth 3 (three) times daily as needed. 04/14/20   [provider]  omeprazole (PRILOSEC) 40 MG capsule Take 1 capsule (40 mg total) by mouth daily before breakfast. 09/30/21   Toney Reil, MD    Inpatient Medications: Scheduled Meds:  Continuous Infusions:  heparin 1,200 Units/hr (09/13/22 1743)   PRN Meds:   Allergies:   No Known Allergies  Social History:   Social History   Tobacco Use   Smoking status: Never   Smokeless tobacco: Never  Substance Use Topics   Alcohol use: Not Currently   Drug use: Never      Family History:   Mother had history of coronary artery disease with CABG in her 71s or 43s.  She also has a history of multiple blood clots and possible stroke.  Father has a history of cancer.  ROS:  Please see the history of present illness. All other ROS reviewed and negative.     Physical Exam/Data:   Vitals:   09/13/22 1716 09/13/22 1720  BP: (!) 153/110   Pulse: (!) 133   Resp: (!) 24   Temp: 97.7 F (36.5 C)   TempSrc: Oral   SpO2: 97%   Weight:  90.7 kg  Height:  5\' 8"  (1.727 m)   No intake or output data in the 24 hours ending 09/13/22 1810    09/13/2022    5:20 PM 09/29/2021    2:24 PM 01/12/2021    3:13 PM  Last 3 Weights   Weight (lbs) 200 lb 198 lb 8 oz 200 lb  Weight (kg) 90.719 kg 90.039 kg 90.719 kg     Body mass index is 30.41 kg/m.  General:  Well nourished, well developed, in no acute distress HEENT: normal Neck: no JVD Vascular: No carotid bruits; Distal pulses 2+ bilaterally Cardiac: Tachycardic but regular without murmurs, rubs, or gallops. Lungs:  clear to auscultation bilaterally, no wheezing, rhonchi or rales  Abd: soft, nontender, no hepatomegaly  Ext: no edema Musculoskeletal:  No deformities, BUE and BLE strength normal and equal Skin: warm and dry  Neuro:  CNs 2-12 intact, no focal abnormalities noted Psych:  Normal affect   EKG:  The EKG was personally reviewed and demonstrates: Sinus tachycardia with 1 mm ST elevation in V1 and V2 as well as lateral T wave inversions. Telemetry:  Telemetry was personally reviewed and demonstrates: Sinus tachycardia.  Relevant CV Studies: Limited bedside echocardiogram demonstrates moderately to severely reduced LVEF (30-35%) with severe anterior hypokinesis.  Laboratory Data:  High Sensitivity Troponin:  No results for input(s): "TROPONINIHS" in the last 720 hours.   ChemistryNo results for input(s): "NA", "K", "CL", "CO2", "GLUCOSE", "BUN", "CREATININE", "CALCIUM", "MG", "GFRNONAA", "GFRAA", "ANIONGAP" in the last 168 hours.  No results for input(s): "PROT", "ALBUMIN", "AST", "ALT", "ALKPHOS", "BILITOT" in the last 168 hours. Lipids No results for input(s): "CHOL", "TRIG", "HDL", "LABVLDL", "LDLCALC", "CHOLHDL" in the last 168 hours.  Hematology Recent Labs  Lab 09/13/22 1726  WBC 7.3  RBC 4.87  HGB 15.2  HCT 42.0  MCV 86.2  MCH 31.2  MCHC 36.2*  RDW 11.9  PLT 285   Thyroid No results for input(s): "TSH", "FREET4" in the last 168 hours.  BNPNo results for input(s): "BNP", "PROBNP" in the last 168 hours.  DDimer No results for input(s): "DDIMER" in the last 168 hours.   Radiology/Studies:  No results found.   Assessment and  Plan:   High risk non-ST segment elevation myocardial infarction: Patient presents with progressive chest pain over the last several months, severe for the last 2 days.  He has persistent chest pain despite sublingual and transdermal nitrates.  His EKG does not quite meet STEMI criteria but is concerning for ongoing ischemia.  His bedside echocardiogram is also suspicious for LAD disease.  I have recommended moving forward with urgent cardiac catheterization and possible PCI.  IV heparin should be continued.  The patient already received aspirin today.  I suspect his elevated heart rate is compensatory for his reduced LVEF as well as pain and anxiety related to his acute MI.  Further recommendations to follow catheterization.  Borderline diabetes and hyperlipidemia: We will check a lipid panel and hemoglobin A1c.  Anticipate high intensity statin therapy after catheterization.  Bipolar disorder: Continue home medications, with ongoing management per internal medicine.  Disposition: Anticipate admission to ICU postcatheterization, most likely on the internal medicine team unless critical care needs arise during catheterization.  CODE STATUS: Patient confirms that he wishes to be full code.   For questions or updates, please contact [facility] Please consult [email or web address] for contact info under [facility].  Signed, [name], MD  09/13/2022 6:10 PM

## 2022-09-13 NOTE — Assessment & Plan Note (Signed)
-   We will continue Depakote ER and Lamictal.

## 2022-09-13 NOTE — Assessment & Plan Note (Signed)
-   The patient will be placed on supplemental coverage with NovoLog. - Will check hemoglobin A1c. - He will receive further diabetic education.

## 2022-09-13 NOTE — ED Triage Notes (Signed)
PT from home via [facility] for experiencing chest pain for the past three days, as well as left elbow/wrist pain.   Ems VITALS  BP190/127 O2 97% GBS 301  Ems gave 1 nitroglycerin and 1/2g nitroglycerin paste @1700 

## 2022-09-13 NOTE — Plan of Care (Signed)
  Problem: Education: Goal: Understanding of CV disease, CV risk reduction, and recovery process will improve Outcome: Progressing Goal: Individualized Educational Video(s) Outcome: Progressing   Problem: Activity: Goal: Ability to return to baseline activity level will improve Outcome: Progressing   Problem: Cardiovascular: Goal: Ability to achieve and maintain adequate cardiovascular perfusion will improve Outcome: Progressing Goal: Vascular access site(s) Level 0-1 will be maintained Outcome: Progressing   Problem: Health Behavior/Discharge Planning: Goal: Ability to safely manage health-related needs after discharge will improve Outcome: Progressing   Problem: Education: Goal: Knowledge of General Education information will improve Description: Including pain rating scale, medication(s)/side effects and non-pharmacologic comfort measures Outcome: Progressing   Problem: Health Behavior/Discharge Planning: Goal: Ability to manage health-related needs will improve Outcome: Progressing   Problem: Clinical Measurements: Goal: Ability to maintain clinical measurements within normal limits will improve Outcome: Progressing Goal: Will remain free from infection Outcome: Progressing Goal: Diagnostic test results will improve Outcome: Progressing Goal: Respiratory complications will improve Outcome: Progressing Goal: Cardiovascular complication will be avoided Outcome: Progressing   Problem: Activity: Goal: Risk for activity intolerance will decrease Outcome: Progressing   Problem: Nutrition: Goal: Adequate nutrition will be maintained Outcome: Progressing   Problem: Coping: Goal: Level of anxiety will decrease Outcome: Progressing   Problem: Elimination: Goal: Will not experience complications related to bowel motility Outcome: Progressing Goal: Will not experience complications related to urinary retention Outcome: Progressing   Problem: Pain Managment: Goal:  General experience of comfort will improve Outcome: Progressing   Problem: Safety: Goal: Ability to remain free from injury will improve Outcome: Progressing   Problem: Skin Integrity: Goal: Risk for impaired skin integrity will decrease Outcome: Progressing   Problem: Education: Goal: Ability to describe self-care measures that may prevent or decrease complications (Diabetes Survival Skills Education) will improve Outcome: Progressing Goal: Individualized Educational Video(s) Outcome: Progressing   Problem: Coping: Goal: Ability to adjust to condition or change in health will improve Outcome: Progressing   Problem: Fluid Volume: Goal: Ability to maintain a balanced intake and output will improve Outcome: Progressing   Problem: Health Behavior/Discharge Planning: Goal: Ability to identify and utilize available resources and services will improve Outcome: Progressing Goal: Ability to manage health-related needs will improve Outcome: Progressing   Problem: Metabolic: Goal: Ability to maintain appropriate glucose levels will improve Outcome: Progressing   Problem: Nutritional: Goal: Maintenance of adequate nutrition will improve Outcome: Progressing Goal: Progress toward achieving an optimal weight will improve Outcome: Progressing   Problem: Skin Integrity: Goal: Risk for impaired skin integrity will decrease Outcome: Progressing   Problem: Tissue Perfusion: Goal: Adequacy of tissue perfusion will improve Outcome: Progressing   

## 2022-09-13 NOTE — ED Provider Notes (Signed)
[facility] Provider Note    Event Date/Time   First MD Initiated Contact with Patient 09/13/22 1710     (approximate)  History   Chief Complaint: Chest Pain  HPI  Aaron Bowers is a 52 y.o. male with a past medical history of bipolar, presents to the emergency department for chest pain.  According to the patient over the last 3 months or so he has been experiencing intermittent chest pain, he states the first he was only experiencing chest pain with exertion but seem to be less and less exertion that caused the chest pain.  States if he rested for several minutes the discomfort will go away.  However yesterday he has developed pain in his chest that has been fairly constant as well as pain which he describes as an aching down into the left wrist.  Patient states the pain has continued today seem to worsen so he called EMS who brought him to the hospital.  Patient took 4 aspirin prior to EMS arrival, got an inch of nitroglycerin ointment.  Patient continues state mild to moderate central chest pain with an aching sensation into the left wrist.  No shortness of breath.  No cough.  No nausea or diaphoresis.  Physical Exam   Triage Vital Signs: ED Triage Vitals  Encounter Vitals Group     BP 09/13/22 1716 (!) 153/110     Systolic BP Percentile --      Diastolic BP Percentile --      Pulse Rate 09/13/22 1716 (!) 133     Resp 09/13/22 1716 (!) 24     Temp 09/13/22 1716 97.7 F (36.5 C)     Temp Source 09/13/22 1716 Oral     SpO2 09/13/22 1716 97 %     Weight 09/13/22 1720 200 lb (90.7 kg)     Height 09/13/22 1720 5\' 8"  (1.727 m)     Head Circumference --      Peak Flow --      Pain Score 09/13/22 1717 5     Pain Loc --      Pain Education --      Exclude from Growth Chart --     Most recent vital signs: Vitals:   09/13/22 1716  BP: (!) 153/110  Pulse: (!) 133  Resp: (!) 24  Temp: 97.7 F (36.5 C)  SpO2: 97%    General: Awake, no distress.   CV:  Good peripheral perfusion.  Regular rate and rhythm  Resp:  Normal effort.  Equal breath sounds bilaterally.  Abd:  No distention.  Soft, nontender.  No rebound or guarding.  ED Results / Procedures / Treatments   EKG  EKG viewed and interpreted by myself shows a sinus tachycardia around 130 bpm with a narrow QRS, normal axis, slight QTc prolongation otherwise normal intervals nonspecific ST changes.  RADIOLOGY  I have reviewed and interpreted chest x-ray images.  No consolidation on my evaluation. Radiology is read the chest x-ray is negative   MEDICATIONS ORDERED IN ED: Medications  heparin injection 4,000 Units (has no administration in time range)     IMPRESSION / MDM / ASSESSMENT AND PLAN / ED COURSE  I reviewed the triage vital signs and the nursing notes.  Patient's presentation is most consistent with acute presentation with potential threat to life or bodily function.  Patient presents to the emergency department for central chest pain with some radiation to the left wrist.  Patient gives a good  history of exertional chest pain that resolves with rest over the summer but has been intermittent but worsening in frequency per patient.  This sounds most consistent with stable angina.  However since yesterday he states he has been experiencing pain into the chest now with radiation into the left arm which just started yesterday, occurring at rest.  Patient had several EKGs by EMS, states when the pain worsened EMS repeated an EKG however on that EKG patient appears to have changes in 1 and aVL showing mild ST elevation as well as V2.  Possible reciprocal depression in lead III.  EKG upon arrival to the emergency department does not appear to show any significant ST elevation.  However given the patient's good story and possible dynamic changes on EKG with EMS I have ordered 4000 units of heparin bolus.  I have discussed the patient with cardiologist Dr. Okey Dupre who is in the  emergency department and will be seeing the patient shortly.  Dr. Okey Dupre has seen the patient will be taking to the Cath Lab.  Patient's chemistry is reassuring besides mild hyperglycemia, troponin has elevated significantly 1371, CBC is normal.  CRITICAL CARE Performed by: Minna Antis   Total critical care time: 30 minutes  Critical care time was exclusive of separately billable procedures and treating other patients.  Critical care was necessary to treat or prevent imminent or life-threatening deterioration.  Critical care was time spent personally by me on the following activities: development of treatment plan with patient and/or surrogate as well as nursing, discussions with consultants, evaluation of patient's response to treatment, examination of patient, obtaining history from patient or surrogate, ordering and performing treatments and interventions, ordering and review of laboratory studies, ordering and review of radiographic studies, pulse oximetry and re-evaluation of patient's condition.   FINAL CLINICAL IMPRESSION(S) / ED DIAGNOSES   Chest pain NSTEMI   Note:  This document was prepared using Dragon voice recognition software and may include unintentional dictation errors.   Minna Antis, MD 09/13/22 2321

## 2022-09-13 NOTE — Progress Notes (Signed)
eLink Physician-Brief Progress Note Patient Name: Aaron Bowers DOB: May 21, 1970 MRN: [id number]   Date of Service  09/13/2022  HPI/Events of Note  Patient admitted with chest pain undergoing work-up.  eICU Interventions  New Patient Evaluation.        Thomasene Lot Ajna Moors 09/13/2022, 9:50 PM

## 2022-09-14 ENCOUNTER — Inpatient Hospital Stay (HOSPITAL_COMMUNITY)
Admit: 2022-09-14 | Discharge: 2022-09-14 | Disposition: A | Discharge status: Home / Self Care | Payer: 59 | Attending: Internal Medicine | Admitting: Internal Medicine

## 2022-09-14 ENCOUNTER — Encounter: Payer: Self-pay | Admitting: Internal Medicine

## 2022-09-14 DIAGNOSIS — E1165 Type 2 diabetes mellitus with hyperglycemia: Secondary | ICD-10-CM

## 2022-09-14 DIAGNOSIS — F319 Bipolar disorder, unspecified: Secondary | ICD-10-CM

## 2022-09-14 DIAGNOSIS — I214 Non-ST elevation (NSTEMI) myocardial infarction: Secondary | ICD-10-CM

## 2022-09-14 DIAGNOSIS — E782 Mixed hyperlipidemia: Secondary | ICD-10-CM | POA: Diagnosis not present

## 2022-09-14 DIAGNOSIS — R7989 Other specified abnormal findings of blood chemistry: Secondary | ICD-10-CM

## 2022-09-14 DIAGNOSIS — E1169 Type 2 diabetes mellitus with other specified complication: Secondary | ICD-10-CM

## 2022-09-14 DIAGNOSIS — K219 Gastro-esophageal reflux disease without esophagitis: Secondary | ICD-10-CM

## 2022-09-14 LAB — ECHOCARDIOGRAM COMPLETE
AR max vel: 2.45 cm2
AV Area VTI: 2.18 cm2
AV Area mean vel: 2.25 cm2
AV Mean grad: 3 mmHg
AV Peak grad: 6 mmHg
Ao pk vel: 1.22 m/s
Area-P 1/2: 3.44 cm2
Height: 68 in
MV VTI: 2.29 cm2
S' Lateral: 3 cm
Single Plane A4C EF: 46.5 %
Weight: 3093.49 [oz_av]

## 2022-09-14 LAB — LIPID PANEL
Cholesterol: 224 mg/dL — ABNORMAL HIGH (ref 0–200)
HDL: 31 mg/dL — ABNORMAL LOW (ref >40)
LDL Cholesterol: 140 mg/dL — ABNORMAL HIGH (ref 0–99)
Total CHOL/HDL Ratio: 7.2 ratio
Triglycerides: 264 mg/dL — ABNORMAL HIGH (ref <150)
VLDL: 53 mg/dL — ABNORMAL HIGH (ref 0–40)

## 2022-09-14 LAB — CBC
HCT: 41 % (ref 39.0–52.0)
Hemoglobin: 14.7 g/dL (ref 13.0–17.0)
MCH: 31.3 pg (ref 26.0–34.0)
MCHC: 35.9 g/dL (ref 30.0–36.0)
MCV: 87.2 fL (ref 80.0–100.0)
Platelets: 265 10*3/uL (ref 150–400)
RBC: 4.7 MIL/uL (ref 4.22–5.81)
RDW: 11.8 % (ref 11.5–15.5)
WBC: 12.3 10*3/uL — ABNORMAL HIGH (ref 4.0–10.5)
nRBC: 0 % (ref 0.0–0.2)

## 2022-09-14 LAB — URINE DRUG SCREEN, QUALITATIVE (ARMC ONLY)
Amphetamines, Ur Screen: NOT DETECTED
Barbiturates, Ur Screen: NOT DETECTED
Benzodiazepine, Ur Scrn: POSITIVE — AB
Cannabinoid 50 Ng, Ur ~~LOC~~: NOT DETECTED
Cocaine Metabolite,Ur ~~LOC~~: NOT DETECTED
MDMA (Ecstasy)Ur Screen: NOT DETECTED
Methadone Scn, Ur: NOT DETECTED
Opiate, Ur Screen: POSITIVE — AB
Phencyclidine (PCP) Ur S: NOT DETECTED
Tricyclic, Ur Screen: NOT DETECTED

## 2022-09-14 LAB — BASIC METABOLIC PANEL
Anion gap: 11 (ref 5–15)
BUN: 8 mg/dL (ref 6–20)
CO2: 23 mmol/L (ref 22–32)
Calcium: 8.5 mg/dL — ABNORMAL LOW (ref 8.9–10.3)
Chloride: 100 mmol/L (ref 98–111)
Creatinine, Ser: 0.65 mg/dL (ref 0.61–1.24)
GFR, Estimated: >60 mL/min (ref >60)
Glucose, Bld: 233 mg/dL — ABNORMAL HIGH (ref 70–99)
Potassium: 4.2 mmol/L (ref 3.5–5.1)
Sodium: 134 mmol/L — ABNORMAL LOW (ref 135–145)

## 2022-09-14 LAB — GLUCOSE, CAPILLARY
Glucose-Capillary: 179 mg/dL — ABNORMAL HIGH (ref 70–99)
Glucose-Capillary: 251 mg/dL — ABNORMAL HIGH (ref 70–99)
Glucose-Capillary: 121 mg/dL — ABNORMAL HIGH (ref 70–99)
Glucose-Capillary: 161 mg/dL — ABNORMAL HIGH (ref 70–99)

## 2022-09-14 LAB — MRSA NEXT GEN BY PCR, NASAL: MRSA by PCR Next Gen: NOT DETECTED

## 2022-09-14 LAB — TROPONIN I (HIGH SENSITIVITY)
Troponin I (High Sensitivity): 17244 ng/L (ref <18)
Troponin I (High Sensitivity): 15387 ng/L (ref <18)

## 2022-09-14 MED ORDER — EZETIMIBE 10 MG PO TABS
10.0000 mg | ORAL_TABLET | Freq: Every day | ORAL | Status: DC
  Administered 2022-09-14 – 2022-09-15 (×2): 10 mg via ORAL
  Filled 2022-09-14 (×2): qty 1

## 2022-09-14 MED ORDER — PERFLUTREN LIPID MICROSPHERE
1.0000 mL | INTRAVENOUS | Status: AC | PRN
  Administered 2022-09-14: 6 mL via INTRAVENOUS

## 2022-09-14 MED ORDER — LIVING WELL WITH DIABETES BOOK
Freq: Once | Status: AC
  Filled 2022-09-14: qty 1

## 2022-09-14 NOTE — Progress Notes (Signed)
*  PRELIMINARY RESULTS* Echocardiogram 2D Echocardiogram has been performed.  Carolyne Fiscal 09/14/2022, 1:14 PM

## 2022-09-14 NOTE — Discharge Instructions (Signed)
Some PCP options in [place] area- not a comprehensive list  [facility]- [phone] [place]- [phone] [facility]- [phone] [facility]- [phone] [facility]- [phone] [place]- [phone]  or [facility] [phone]  

## 2022-09-14 NOTE — Progress Notes (Signed)
Patient ambulated with steady gait around the ICU unit.  Patient denied any shortness of breath or pain while ambulating.  Vitals remain stable.

## 2022-09-14 NOTE — TOC Initial Note (Signed)
Transition of Care [facility]) - Initial/Assessment Note    Patient Details  Name: Aaron Bowers MRN: [id number] Date of Birth: 08-10-1970  Transition of Care [facility]) CM/SW Contact:    Truddie Hidden, RN Phone Number: 09/14/2022, 2:48 PM  Clinical Narrative:                 Spoke with patientand his fiance at the bedside. Patient has insurance but does not have a PCP. He was advised  a PCP list was added to his AVS for discharge. He was been advised he may also contact his insurance for a list of PAR providers. His fiancee will be his transportation home at discharge.          Patient Goals and CMS Choice            Expected Discharge Plan and Services                                              Prior Living Arrangements/Services                       Activities of Daily Living Home Assistive Devices/Equipment: Eyeglasses ADL Screening (condition at time of admission) Patient's cognitive ability adequate to safely complete daily activities?: Yes Is the patient deaf or have difficulty hearing?: No Does the patient have difficulty seeing, even when wearing glasses/contacts?: No Does the patient have difficulty concentrating, remembering, or making decisions?: No Patient able to express need for assistance with ADLs?: Yes Does the patient have difficulty dressing or bathing?: No Independently performs ADLs?: Yes (appropriate for developmental age) Does the patient have difficulty walking or climbing stairs?: No Weakness of Legs: None Weakness of Arms/Hands: None  Permission Sought/Granted                  Emotional Assessment              Admission diagnosis:  Non-ST elevation (NSTEMI) myocardial infarction [facility]) [I21.4] NSTEMI (non-ST elevated myocardial infarction) (HCC) [I21.4] Patient Active Problem List   Diagnosis Date Noted   Mixed hyperlipidemia 09/14/2022   Non-ST elevation (NSTEMI) myocardial infarction (HCC) 09/13/2022    NSTEMI (non-ST elevated myocardial infarction) (HCC) 09/13/2022   Bipolar 1 disorder (HCC) 09/13/2022   GERD without esophagitis 09/13/2022   Uncontrolled type 2 diabetes mellitus with hyperglycemia, without long-term current use of insulin (HCC) 09/13/2022   Chronic GERD 01/12/2021   Dysphagia    PCP:  Pcp, No Pharmacy:   [id number] [place], [place] - [address] [place] [address] Phone: [phone] Fax: [phone]     Social Determinants of Health (SDOH) Social History: SDOH Screenings   Food Insecurity: No Food Insecurity (09/13/2022)  Housing: Low Risk  (09/13/2022)  Transportation Needs: No Transportation Needs (09/13/2022)  Utilities: Not At Risk (09/13/2022)  Tobacco Use: Low Risk  (09/13/2022)   SDOH Interventions:     Readmission Risk Interventions     No data to display

## 2022-09-14 NOTE — Progress Notes (Signed)
Progress Note   Patient: Aaron Bowers ZOX:[id number] DOB: [birth year] DOA: 09/13/2022     1 DOS: the patient was seen and examined on 09/14/2022   Brief hospital course: Aaron Bowers is a 52 y.o. Caucasian male with medical history significant for bipolar disorder, borderline type 2 diabetes mellitus and dyslipidemia, who presented to the emergency room with acute onset of left parasternal chest pain felt as tightness and moderate to severe intensity with no radiation and no nausea or vomiting or diaphoresis.  The emergency department he was noted to be tachycardic EKG showed sinus tachycardia with heart rate 132, poor R wave progression, J-point elevation in V2 and T wave inversion anteroseptal and laterally noted.  High sensitive troponin I was 1371 and later bumped to 10,000.  Cardiologist evaluated patient and he was taken to the Cath Lab.  Catheterization procedure done showed subtotal occluded mid LAD status post stent placement.  Assessment and Plan: NSTEMI (non-ST elevated myocardial infarction) [facility]) S/p cardiac cath, LAD stent placement. Troponin bumped to 17K today morning. Patient will be continued on aspirin, Effient, statin and beta-blocker. Repeat echocardiogram done today. Appreciate cardiology follow-up.  Uncontrolled type 2 diabetes mellitus with hyperglycemia, without long-term current use of insulin (HCC) Continue Accu-Cheks, sliding scale insulin Diabetes educator evaluated him. A1c pending. Continue accucheks, sliding scale insulin.  Pseudohyponatremia- Due to high blood sugars. Corrected Na within normal range. Control blood sugars.  Hyperlipidemia: LDL 140. Continue Zetia, Lipitor 80 mg daily.  Bipolar 1 disorder (HCC) Continue Depakote ER and Lamictal.  GERD without esophagitis Continue PPI therapy.     Subjective: Patient is seen and examined today morning. He is sitting in chair. Denies chest pain or shortness of breath. Wishes to go home. Echo  done today morning per RN.  Physical Exam: Vitals:   09/14/22 0600 09/14/22 0801 09/14/22 1244 09/14/22 1608  BP: 98/62 121/88 119/80 126/87  Pulse: 89 87  88  Resp: 15 10 17  (!) 21  Temp:  98.4 F (36.9 C) 98.5 F (36.9 C) 98.9 F (37.2 C)  TempSrc:  Oral Oral   SpO2: 91% 98%  98%  Weight:      Height:       General - Middle aged Caucasian male, no apparent distress HEENT - PERRLA, EOMI, atraumatic head, non tender sinuses. Lung - Clear, rales, rhonchi, wheezes. Heart - S1, S2 heard, no murmurs, rubs, trace pedal edema Neuro - Alert, awake and oriented x 3, non focal exam. Skin - Warm and dry. Right hand cath site with no bleeding. Data Reviewed:     Latest Ref Rng & Units 09/14/2022    2:47 AM 09/13/2022    5:26 PM  CBC  WBC 4.0 - 10.5 K/uL 12.3  7.3   Hemoglobin 13.0 - 17.0 g/dL 81.1  91.4   Hematocrit 39.0 - 52.0 % 41.0  42.0   Platelets 150 - 400 K/uL 265  285       Latest Ref Rng & Units 09/14/2022    2:47 AM 09/13/2022    5:26 PM  BMP  Glucose 70 - 99 mg/dL 782  956   BUN 6 - 20 mg/dL 8  9   Creatinine 2.13 - 1.24 mg/dL 0.86  5.78   Sodium 469 - 145 mmol/L 134  134   Potassium 3.5 - 5.1 mmol/L 4.2  3.8   Chloride 98 - 111 mmol/L 100  97   CO2 22 - 32 mmol/L 23  23   Calcium 8.9 -  10.3 mg/dL 8.5  9.0    ECHOCARDIOGRAM COMPLETE  Result Date: 09/14/2022    ECHOCARDIOGRAM REPORT   Patient Name:   Aaron Bowers [facility] Date of Exam: 09/14/2022 Medical Rec #:  [id number]         Height:       68.0 in Accession #:    [id number]        Weight:       193.3 lb Date of Birth:  1970-02-03         BSA:          2.015 m Patient Age:    52 years          BP:           121/88 mmHg Patient Gender: M                 HR:           96 bpm. Exam Location:  [facility] Procedure: 2D Echo, 3D Echo, Cardiac Doppler, Color Doppler, Strain Analysis and            Intracardiac Opacification Agent Indications:     NSTEMI  History:         Patient has no prior history of Echocardiogram examinations.                   Acute MI; Risk Factors:Diabetes.  Sonographer:     Mikki Harbor Referring Phys:  [phone] CHRISTOPHER END Diagnosing Phys: Julien Nordmann MD  Sonographer Comments: Global longitudinal strain was attempted. IMPRESSIONS  1. Left ventricular ejection fraction, by estimation, is 35 to 40%. Left ventricular ejection fraction by 3D volume is 45 %. The left ventricle has moderately decreased function. The left ventricle demonstrates regional wall motion abnormalities (mid to  distal anteroroseptal, anterior, periapical, mid to distal lateral walls ). Basal regions best preserved. Left ventricular diastolic parameters are indeterminate. The average left ventricular global longitudinal strain is -6.7 %. The global longitudinal  strain is abnormal.  2. Right ventricular systolic function is normal. The right ventricular size is normal. There is normal pulmonary artery systolic pressure. The estimated right ventricular systolic pressure is 32.8 mmHg.  3. The mitral valve is normal in structure. Moderate mitral valve regurgitation. No evidence of mitral stenosis.  4. The aortic valve is tricuspid. Aortic valve regurgitation is not visualized. No aortic stenosis is present.  5. The inferior vena cava is normal in size with greater than 50% respiratory variability, suggesting right atrial pressure of 3 mmHg. FINDINGS  Left Ventricle: Left ventricular ejection fraction, by estimation, is 35 to 40%. Left ventricular ejection fraction by 3D volume is 45 %. The left ventricle has moderately decreased function. The left ventricle demonstrates regional wall motion abnormalities. Definity contrast agent was given IV to delineate the left ventricular endocardial borders. The average left ventricular global longitudinal strain is -6.7 %. The global longitudinal strain is abnormal. The left ventricular internal cavity  size was normal in size. There is borderline left ventricular hypertrophy. Left ventricular diastolic  parameters are indeterminate. Right Ventricle: The right ventricular size is normal. No increase in right ventricular wall thickness. Right ventricular systolic function is normal. There is normal pulmonary artery systolic pressure. The tricuspid regurgitant velocity is 2.73 m/s, and  with an assumed right atrial pressure of 3 mmHg, the estimated right ventricular systolic pressure is 32.8 mmHg. Left Atrium: Left atrial size was normal in size. Right Atrium: Right atrial size was normal in size. Pericardium: There is  no evidence of pericardial effusion. Mitral Valve: The mitral valve is normal in structure. Moderate mitral valve regurgitation. No evidence of mitral valve stenosis. MV peak gradient, 3.3 mmHg. The mean mitral valve gradient is 1.0 mmHg. Tricuspid Valve: The tricuspid valve is normal in structure. Tricuspid valve regurgitation is mild . No evidence of tricuspid stenosis. Aortic Valve: The aortic valve is tricuspid. Aortic valve regurgitation is not visualized. No aortic stenosis is present. Aortic valve mean gradient measures 3.0 mmHg. Aortic valve peak gradient measures 6.0 mmHg. Aortic valve area, by VTI measures 2.18 cm. Pulmonic Valve: The pulmonic valve was normal in structure. Pulmonic valve regurgitation is trivial. No evidence of pulmonic stenosis. Aorta: The aortic root is normal in size and structure. Venous: The inferior vena cava is normal in size with greater than 50% respiratory variability, suggesting right atrial pressure of 3 mmHg. IAS/Shunts: No atrial level shunt detected by color flow Doppler.  LEFT VENTRICLE PLAX 2D LVIDd:         4.60 cm         Diastology LVIDs:         3.00 cm         LV e' medial:  9.14 cm/s LV PW:         1.10 cm         LV e' lateral: 10.60 cm/s LV IVS:        1.20 cm LVOT diam:     2.00 cm         2D LV SV:         53              Longitudinal LV SV Index:   26              Strain LVOT Area:     3.14 cm        2D Strain GLS  -6.7 %                                 Avg:  LV Volumes (MOD)               3D Volume EF LV vol d, MOD    85.2 ml       LV 3D EF:    Left A4C:                                        ventricul LV vol s, MOD    45.6 ml                    ar A4C:                                        ejection LV SV MOD A4C:   85.2 ml                    fraction                                             by 3D  volume is                                             45 %.                                 3D Volume EF:                                3D EF:        45 % RIGHT VENTRICLE RV Basal diam:  3.05 cm RV Mid diam:    2.90 cm RV S prime:     16.00 cm/s TAPSE (M-mode): 1.9 cm LEFT ATRIUM             Index        RIGHT ATRIUM           Index LA diam:        3.90 cm 1.94 cm/m   RA Area:     10.50 cm LA Vol (A2C):   35.0 ml 17.37 ml/m  RA Volume:   20.90 ml  10.37 ml/m LA Vol (A4C):   36.4 ml 18.07 ml/m LA Biplane Vol: 37.9 ml 18.81 ml/m  AORTIC VALVE                    PULMONIC VALVE AV Area (Vmax):    2.45 cm     PV Vmax:       1.15 m/s AV Area (Vmean):   2.25 cm     PV Peak grad:  5.3 mmHg AV Area (VTI):     2.18 cm AV Vmax:           122.00 cm/s AV Vmean:          81.600 cm/s AV VTI:            0.242 m AV Peak Grad:      6.0 mmHg AV Mean Grad:      3.0 mmHg LVOT Vmax:         95.00 cm/s LVOT Vmean:        58.400 cm/s LVOT VTI:          0.168 m LVOT/AV VTI ratio: 0.69  AORTA Ao Root diam: 2.90 cm MITRAL VALVE              TRICUSPID VALVE MV Area (PHT): 3.44 cm   TR Peak grad:   29.8 mmHg MV Area VTI:   2.29 cm   TR Vmax:        273.00 cm/s MV Peak grad:  3.3 mmHg MV Mean grad:  1.0 mmHg   SHUNTS MV Vmax:       0.91 m/s   Systemic VTI:  0.17 m MV Vmean:      53.6 cm/s  Systemic Diam: 2.00 cm Julien Nordmann MD Electronically signed by Julien Nordmann MD Signature Date/Time: 09/14/2022/1:28:36 PM    Final    CARDIAC CATHETERIZATION  Result Date: 09/13/2022 Conclusions: Severe single-vessel coronary artery disease  with 99% stenosis of mid LAD involving ostium of moderate-caliber D2 branch.  No significant disease noted in dominant LCx or small RCA. Moderately to severely reduced left ventricular systolic function (LVEF 30-35%) with normal filling pressure (LVEDP 10 mmHg). Successful PCI  to mid LAD using Onyx Frontier 2.75 x 38 mm drug-eluting stent (postdilated to 3.1 mm) and angioplasty of jailed D2 branch using 2.0 mm balloon.  Final angiogram shows 0% residual stenosis in the LAD and 30% residual stenosis at the ostium of D2.  There is TIMI-3 flow throughout the left coronary artery. Recommendations: Continue tirofiban infusion for 4 hours. Dual antiplatelet therapy with aspirin and prasugrel for at least 12 months. Aggressive secondary prevention of coronary artery disease. Obtain echocardiogram.  If LVEF confirmed to be less than 35%, consider LifeVest at discharge. [name], MD [facility]  DG Chest Portable 1 View  Result Date: 09/13/2022 CLINICAL DATA:  Chest pain EXAM: PORTABLE CHEST 1 VIEW COMPARISON:  11/02/2006 FINDINGS: Low lung volumes are present, causing crowding of the pulmonary vasculature. The lungs appear clear. Cardiac and mediastinal contours normal. No blunting of the costophrenic angles. No significant bony abnormality observed. IMPRESSION: 1. Low lung volumes. No active cardiopulmonary disease is radiographically apparent. Electronically Signed   By: Gaylyn Rong M.D.   On: 09/13/2022 18:34     Family Communication: Patient lives alone, understands and agrees with current care plan.  Disposition: Status is: Inpatient Remains inpatient appropriate because: s/p cardiac cath, stent placement.  Planned Discharge Destination: Home    Time spent: 41 minutes  Author: Marcelino Duster, MD 09/14/2022 4:10 PM  For on call review [email or web address].

## 2022-09-14 NOTE — Progress Notes (Signed)
Rounding Note    Patient Name: Aaron Bowers Date of Encounter: 09/14/2022  [facility] Cardiologist: [facility]- Dr. Okey Dupre  Subjective   Past events from last night, presenting with angina taken to the catheterization lab Subtotally occluded mid LAD noted with PCI Onyx frontier 2.75 x 38 mm stent Angioplasty of jailed diagonal #2 Reduced ejection fraction noted on LV gram Echocardiogram pending This morning reports feeling well, anxious, mild chest discomfort Peak troponin this morning 17,000 On aspirin, Effient, statin  Reports mother with CABG , He was previously on medications for hyperlipidemia, stopped the medication LDL 140  Inpatient Medications    Scheduled Meds:  aspirin  81 mg Oral Daily   atorvastatin  80 mg Oral Daily   Chlorhexidine Gluconate Cloth  6 each Topical Daily   enoxaparin (LOVENOX) injection  40 mg Subcutaneous Q24H   ezetimibe  10 mg Oral Daily   insulin aspart  0-15 Units Subcutaneous TID WC   insulin aspart  0-5 Units Subcutaneous QHS   living well with diabetes book   Does not apply Once   metoprolol succinate  12.5 mg Oral QHS   prasugrel  10 mg Oral Daily   sodium chloride flush  3 mL Intravenous Q12H   Continuous Infusions:  sodium chloride 10 mL/hr at 09/14/22 0610   sodium chloride     PRN Meds: sodium chloride, acetaminophen, ondansetron (ZOFRAN) IV, sodium chloride flush   Vital Signs    Vitals:   09/14/22 0400 09/14/22 0500 09/14/22 0600 09/14/22 0801  BP: (!) 83/63 100/69 98/62 121/88  Pulse: 93 86 89 87  Resp: (!) 6 12 15 10   Temp: 98.3 F (36.8 C)     TempSrc: Oral     SpO2: 96% 97% 91% 98%  Weight:      Height:        Intake/Output Summary (Last 24 hours) at 09/14/2022 0933 Last data filed at 09/14/2022 0900 Gross per 24 hour  Intake 451.26 ml  Output 1625 ml  Net -1173.74 ml      09/13/2022    8:20 PM 09/13/2022    5:20 PM 09/29/2021    2:24 PM  Last 3 Weights  Weight (lbs) 193 lb 5.5 oz 200 lb 198  lb 8 oz  Weight (kg) 87.7 kg 90.719 kg 90.039 kg      Telemetry    Normal sinus rhythm- Personally Reviewed  ECG     - Personally Reviewed  Physical Exam   GEN: No acute distress.   Neck: No JVD Cardiac: RRR, no murmurs, rubs, or gallops.  Respiratory: Clear to auscultation bilaterally. GI: Soft, nontender, non-distended  MS: No edema; No deformity. Neuro:  Nonfocal  Psych: Normal affect   Labs    High Sensitivity Troponin:   Recent Labs  Lab 09/13/22 1726 09/13/22 2039  TROPONINIHS 1,371* 10,001*     Chemistry Recent Labs  Lab 09/13/22 1726 09/14/22 0247  NA 134* 134*  K 3.8 4.2  CL 97* 100  CO2 23 23  GLUCOSE 343* 233*  BUN 9 8  CREATININE 0.86 0.65  CALCIUM 9.0 8.5*  PROT 7.7  --   ALBUMIN 4.0  --   AST 49*  --   ALT 22  --   ALKPHOS 89  --   BILITOT 0.6  --   GFRNONAA >60 >60  ANIONGAP 14 11    Lipids  Recent Labs  Lab 09/14/22 0247  CHOL 224*  TRIG 264*  HDL 31*  LDLCALC  140*  CHOLHDL 7.2    Hematology Recent Labs  Lab 09/13/22 1726 09/14/22 0247  WBC 7.3 12.3*  RBC 4.87 4.70  HGB 15.2 14.7  HCT 42.0 41.0  MCV 86.2 87.2  MCH 31.2 31.3  MCHC 36.2* 35.9  RDW 11.9 11.8  PLT 285 265   Thyroid No results for input(s): "TSH", "FREET4" in the last 168 hours.  BNPNo results for input(s): "BNP", "PROBNP" in the last 168 hours.  DDimer No results for input(s): "DDIMER" in the last 168 hours.   Radiology    CARDIAC CATHETERIZATION  Result Date: 09/13/2022 Conclusions: Severe single-vessel coronary artery disease with 99% stenosis of mid LAD involving ostium of moderate-caliber D2 branch.  No significant disease noted in dominant LCx or small RCA. Moderately to severely reduced left ventricular systolic function (LVEF 30-35%) with normal filling pressure (LVEDP 10 mmHg). Successful PCI to mid LAD using Onyx Frontier 2.75 x 38 mm drug-eluting stent (postdilated to 3.1 mm) and angioplasty of jailed D2 branch using 2.0 mm balloon.  Final  angiogram shows 0% residual stenosis in the LAD and 30% residual stenosis at the ostium of D2.  There is TIMI-3 flow throughout the left coronary artery. Recommendations: Continue tirofiban infusion for 4 hours. Dual antiplatelet therapy with aspirin and prasugrel for at least 12 months. Aggressive secondary prevention of coronary artery disease. Obtain echocardiogram.  If LVEF confirmed to be less than 35%, consider LifeVest at discharge. [name], MD [facility]  DG Chest Portable 1 View  Result Date: 09/13/2022 CLINICAL DATA:  Chest pain EXAM: PORTABLE CHEST 1 VIEW COMPARISON:  11/02/2006 FINDINGS: Low lung volumes are present, causing crowding of the pulmonary vasculature. The lungs appear clear. Cardiac and mediastinal contours normal. No blunting of the costophrenic angles. No significant bony abnormality observed. IMPRESSION: 1. Low lung volumes. No active cardiopulmonary disease is radiographically apparent. Electronically Signed   By: Gaylyn Rong M.D.   On: 09/13/2022 18:34    Cardiac Studies   [facility] 09/13/22 Conclusions: Severe single-vessel coronary artery disease with 99% stenosis of mid LAD involving ostium of moderate-caliber D2 branch.  No significant disease noted in dominant LCx or small RCA. Moderately to severely reduced left ventricular systolic function (LVEF 30-35%) with normal filling pressure (LVEDP 10 mmHg). Successful PCI to mid LAD using Onyx Frontier 2.75 x 38 mm drug-eluting stent (postdilated to 3.1 mm) and angioplasty of jailed D2 branch using 2.0 mm balloon.  Final angiogram shows 0% residual stenosis in the LAD and 30% residual stenosis at the ostium of D2.  There is TIMI-3 flow throughout the left coronary artery.   Recommendations: Continue tirofiban infusion for 4 hours. Dual antiplatelet therapy with aspirin and prasugrel for at least 12 months. Aggressive secondary prevention of coronary artery disease. Obtain echocardiogram.  If LVEF confirmed to  be less than 35%, consider LifeVest at discharge.   [name], MD [facility]  Antiplatelet/Anticoag Recommend uninterrupted dual antiplatelet therapy with Aspirin 81mg  daily and Prasugrel 10mg  daily for a minimum of 12 months (ACS-Class I recommendation).  Discharge Date In the absence of any other complications or medical issues, we expect the patient to be ready for discharge from an interventional cardiology perspective on 09/15/2022.   Coronary Diagrams  Diagnostic Dominance: Left  Intervention      Patient Profile     52 y.o. male with a hx of borderline diabetes and borderline hyperlipidemia as well as bipolar disorder, who is being seen 09/13/2022 for the evaluation of chest pain and abnormal  EKG   Assessment & Plan    NSTEMI - heart cath showed severe single vessel CAD with 99% stenosis of mid LAD involving ostium of moderate-caliber D2 branch. No significant disease in Lcx or RCA. Moderate to severe reduced LVEF 30-354% with normal filling pressures - treated with PCI to mLAD -On aspirin, Effient statin, beta-blocker -Echocardiogram pending  HLD Continue Lipitor 80 daily with Zetia 10 daily  Bipolar disorder Reports having anxiety Will defer management to primary care   Total encounter time more than 50 minutes  Greater than 50% was spent in counseling and coordination of care with the patient   For questions or updates, please contact [facility] Please consult [email or web address] for contact info under        Signed, Cadence David Stall, PA-C  09/14/2022, 9:33 AM

## 2022-09-14 NOTE — Inpatient Diabetes Management (Addendum)
Inpatient Diabetes Program Recommendations  AACE/ADA: New Consensus Statement on Inpatient Glycemic Control  Target Ranges:  Prepandial:   less than 140 mg/dL      Peak postprandial:   less than 180 mg/dL (1-2 hours)      Critically ill patients:  140 - 180 mg/dL    Latest Reference Range & Units 09/13/22 20:27 09/14/22 07:25  Glucose-Capillary 70 - 99 mg/dL 952 (H) 841 (H)    Latest Reference Range & Units 09/13/22 17:26 09/14/22 02:47  Glucose 70 - 99 mg/dL 324 (H) 401 (H)   Review of Glycemic Control  Diabetes history: DM2 Outpatient Diabetes medications: None Current orders for Inpatient glycemic control: Novolog 0-15 units TID with meals, Novolog 0-5 units QHS  Inpatient Diabetes Program Recommendations:    HbgA1C: Current A1C in process.  Outpatient DM medications: IF patient is discharged on insulin, please provide Rx for insulin pens and pen needles.  NOTE: Patient admitted with NSTEMI. Per chart, patient has hx of borderline DM2. Patient has insurance and reports he will need a new PCP (prior PCP office closed). Ordered Living Well with DM book. Will plan to talk with patient today.  Addendum 09/14/22@11 :40-Spoke with patient and fiance at bedside about diabetes and home regimen for diabetes control. Patient reports he has hx of borderline DM2 but has never been on DM medication. Patient states that his prior PCP was last seen about 1 year ago and his A1C was in 7% range then.  His PCP elected for him to try to make lifestyle changes and they would consider starting DM medication after new follow up. Patient reports that his PCP office closed so he has not followed up with a provider since. Patient states he does have insurance (not currently in chart).  Consulted TOC to assist with getting insurance information in chart and asked if they could provide list of providers taking new patients. Patient reports that he has a glucometer and testing supplies at home but he does not check  glucose very often. Patient states that when he has checked it fasting, it is usually in the low 100's mg/dl.  Explained what an A1C is and explained that his A1C has not resulted yet.  Discussed glucose and A1C goals. Discussed importance of checking CBGs and maintaining good CBG control to prevent long-term and short-term complications. Explained how hyperglycemia leads to damage within blood vessels which lead to the common complications seen with uncontrolled diabetes. Stressed to the patient the importance of improving glycemic control to prevent further complications from uncontrolled diabetes. Discussed impact of nutrition, exercise, stress, sickness, and medications on diabetes control.  Discussed carbohydrates, carbohydrate goals per day and meal, along with portion sizes. Patient has Living Well with DM book at bedside; encouraged patient to read entire book to increase knowledge about DM management.  Explained that it is not clear at this time as far as what DM medications will be prescribed at discharge. Informed patient I would provide education on insulin just in case he is prescribed insulin at discharge. Patient's fiance has DM and has used [place] in the past. Patient states that he has given his fiance injections of the [place] before he is familiar with using the insulin pen.  Educated patient on insulin pen use at home. Reviewed all steps of insulin pen including attachment of needle, 2-unit air shot, dialing up dose, giving injection, removing needle, disposal of sharps, storage of unused insulin, disposal of insulin etc. Patient able to provide successful return demonstration.  Discussed various oral DM medications (Metformin, Glipizide, Jardiance, Farxiga, Tradjenta) in case they were prescribed at discharge. Reviewed hypoglycemia, hyperglycemia, symptoms and treatment for both. Encouraged patient to be sure to get established with new PCP, follow up consistently, and keep provider informed on  glucose trends. Explained that if he has issues with hypoglycemia, then DM medication may need to be decreased so he should reach out to PCP if he experiences any hypoglycemia.  Patient verbalized understanding of information discussed and reports no further questions at this time related to diabetes.   Thanks, Orlando Penner, RN, MSN, CDCES Diabetes Coordinator Inpatient Diabetes Program [phone] (Team Pager from 8am to 5pm)

## 2022-09-14 NOTE — Progress Notes (Signed)
Telephone report called to Felecia Jan, RN.  Patient to be taken to  room 240 by Leeroy Bock via wheelchair.

## 2022-09-15 ENCOUNTER — Other Ambulatory Visit: Payer: Self-pay | Admitting: Physician Assistant

## 2022-09-15 ENCOUNTER — Other Ambulatory Visit (HOSPITAL_COMMUNITY): Payer: Self-pay

## 2022-09-15 DIAGNOSIS — I25110 Atherosclerotic heart disease of native coronary artery with unstable angina pectoris: Secondary | ICD-10-CM | POA: Diagnosis not present

## 2022-09-15 DIAGNOSIS — Z955 Presence of coronary angioplasty implant and graft: Secondary | ICD-10-CM | POA: Diagnosis not present

## 2022-09-15 DIAGNOSIS — I255 Ischemic cardiomyopathy: Secondary | ICD-10-CM

## 2022-09-15 DIAGNOSIS — R7989 Other specified abnormal findings of blood chemistry: Secondary | ICD-10-CM | POA: Insufficient documentation

## 2022-09-15 DIAGNOSIS — E119 Type 2 diabetes mellitus without complications: Secondary | ICD-10-CM | POA: Diagnosis not present

## 2022-09-15 DIAGNOSIS — I2510 Atherosclerotic heart disease of native coronary artery without angina pectoris: Secondary | ICD-10-CM

## 2022-09-15 DIAGNOSIS — I214 Non-ST elevation (NSTEMI) myocardial infarction: Secondary | ICD-10-CM | POA: Diagnosis not present

## 2022-09-15 LAB — LIPOPROTEIN A (LPA): Lipoprotein (a): 96.2 nmol/L — ABNORMAL HIGH (ref <75.0)

## 2022-09-15 LAB — GLUCOSE, CAPILLARY
Glucose-Capillary: 177 mg/dL — ABNORMAL HIGH (ref 70–99)
Glucose-Capillary: 218 mg/dL — ABNORMAL HIGH (ref 70–99)

## 2022-09-15 LAB — HEMOGLOBIN A1C
Hgb A1c MFr Bld: 7.1 % — ABNORMAL HIGH (ref 4.8–5.6)
Mean Plasma Glucose: 157 mg/dL

## 2022-09-15 MED ORDER — ASPIRIN 81 MG PO CHEW: 81.0000 mg | CHEWABLE_TABLET | Freq: Every day | ORAL | 3 refills | Status: DC

## 2022-09-15 MED ORDER — METOPROLOL SUCCINATE ER 25 MG PO TB24: 12.5000 mg | ORAL_TABLET | Freq: Every day | ORAL | 3 refills | Status: DC

## 2022-09-15 MED ORDER — DIVALPROEX SODIUM ER 500 MG PO TB24
500.0000 mg | ORAL_TABLET | Freq: Three times a day (TID) | ORAL | Status: DC
  Filled 2022-09-15 (×2): qty 1

## 2022-09-15 MED ORDER — DOCUSATE SODIUM 100 MG PO CAPS
100.0000 mg | ORAL_CAPSULE | Freq: Two times a day (BID) | ORAL | Status: DC
  Administered 2022-09-15: 100 mg via ORAL
  Filled 2022-09-15: qty 1

## 2022-09-15 MED ORDER — FUROSEMIDE 20 MG PO TABS: 20.0000 mg | ORAL_TABLET | ORAL | Status: DC

## 2022-09-15 MED ORDER — LAMOTRIGINE 100 MG PO TABS: 150.0000 mg | ORAL_TABLET | Freq: Two times a day (BID) | ORAL | Status: DC

## 2022-09-15 MED ORDER — METFORMIN HCL ER 500 MG PO TB24: 500.0000 mg | ORAL_TABLET | Freq: Two times a day (BID) | ORAL | 3 refills | Status: DC

## 2022-09-15 MED ORDER — POTASSIUM CHLORIDE CRYS ER 10 MEQ PO TBCR: 10.0000 meq | EXTENDED_RELEASE_TABLET | ORAL | Status: DC

## 2022-09-15 MED ORDER — SENNA 8.6 MG PO TABS: 2.0000 | ORAL_TABLET | Freq: Every day | ORAL | Status: DC

## 2022-09-15 MED ORDER — POLYETHYLENE GLYCOL 3350 17 G PO PACK
17.0000 g | PACK | Freq: Every day | ORAL | Status: DC
  Administered 2022-09-15: 17 g via ORAL
  Filled 2022-09-15: qty 1

## 2022-09-15 MED ORDER — MAGNESIUM HYDROXIDE 400 MG/5ML PO SUSP
15.0000 mL | Freq: Once | ORAL | Status: AC
  Administered 2022-09-15: 15 mL via ORAL
  Filled 2022-09-15: qty 30

## 2022-09-15 MED ORDER — POTASSIUM CHLORIDE CRYS ER 10 MEQ PO TBCR: 10.0000 meq | EXTENDED_RELEASE_TABLET | ORAL | 1 refills | Status: DC

## 2022-09-15 MED ORDER — ATORVASTATIN CALCIUM 80 MG PO TABS: 80.0000 mg | ORAL_TABLET | Freq: Every day | ORAL | 3 refills | Status: DC

## 2022-09-15 MED ORDER — FUROSEMIDE 20 MG PO TABS: 20.0000 mg | ORAL_TABLET | ORAL | 1 refills | Status: DC

## 2022-09-15 MED ORDER — EZETIMIBE 10 MG PO TABS: 10.0000 mg | ORAL_TABLET | Freq: Every day | ORAL | 3 refills | Status: DC

## 2022-09-15 MED ORDER — PRASUGREL HCL 10 MG PO TABS: 10.0000 mg | ORAL_TABLET | Freq: Every day | ORAL | 3 refills | Status: DC

## 2022-09-15 NOTE — Discharge Summary (Signed)
Physician Discharge Summary   Patient: Aaron Bowers MRN: [id number] DOB: [birth year]  Admit date:     09/13/2022  Discharge date: 09/15/22  Discharge Physician: Aaron Bowers   PCP: Pcp, No   Recommendations at discharge:    PCP follow up in 1 week. Cardiology follow up as scheduled.  Discharge Diagnoses: Principal Problem:   Non-ST elevation (NSTEMI) myocardial infarction [facility]) Active Problems:   S/P coronary artery stent placement   CAD (coronary artery disease)   NSTEMI (non-ST elevated myocardial infarction) (HCC)   New onset type 2 diabetes mellitus (HCC)   Bipolar 1 disorder (HCC)   GERD without esophagitis   Mixed hyperlipidemia   Pseudohyponatremia   Ischemic cardiomyopathy  Resolved Problems:   * No resolved hospital problems. *  Hospital Course: Aaron Bowers is a 53 y.o. Caucasian male with medical history significant for bipolar disorder, borderline type 2 diabetes mellitus and dyslipidemia, who presented to the emergency room with acute onset of left parasternal chest pain felt as tightness and moderate to severe intensity with no radiation and no nausea or vomiting or diaphoresis.  The emergency department he was noted to be tachycardic EKG showed sinus tachycardia with heart rate 132, poor R wave progression, J-point elevation in V2 and T wave inversion anteroseptal and laterally noted.  High sensitive troponin I was 1371 and later bumped to 10,000.  Cardiologist evaluated patient and he was taken to the Cath Lab.  Catheterization procedure done showed subtotal occluded mid LAD status post stent placement 09/13/22.  Patient tolerated the procedure well, echocardiogram showed EF 40%, started on Lasix low-dose therapy.  Patient continued on beta-blocker, Lipitor, Zetia, Effient therapy.  He has new diagnosis of type 2 diabetes mellitus, started metformin XR 500 mg twice daily.  All prescriptions sent to his pharmacy.  Advised him to follow-up with primary  care physician, cardiologist upon discharge as instructed.  Work letter with restrictions printed out.  Patient understands and agrees with the discharge plan.   Assessment and Plan: NSTEMI (non-ST elevated myocardial infarction) [facility]) S/p cardiac cath, LAD stent placement. Troponin bumped to 17K today morning. Patient will be continued on aspirin, Effient, statin and beta-blocker. Repeat echocardiogram done today. Appreciate cardiology follow-up.  Acute heart failure with reduced ejection fraction Ischemic cardiomyopathy: Echocardiogram showed EF 40%. Continue beta-blocker. Lasix 20 mg every other day with potassium supplementation recommended. Further GDMT as per cardiology follow-up. Repeat echocardiogram in 3 months.   Type 2 diabetes mellitus-new diagnosis Diabetes educator evaluated him. A1c 7.1. Metformin XR 500 mg twice daily prescribed Advised primary care physician follow-up for further management.   Pseudohyponatremia- Due to high blood sugars. Corrected Na within normal range. Control blood sugars.   Hyperlipidemia: LDL 140. Continue Zetia, Lipitor 80 mg daily.   Bipolar 1 disorder (HCC) Continue Depakote ER and Lamictal.   GERD without esophagitis Continue PPI therapy.        Consultants: Cardiology Procedures performed: Left heart cath status post LAD stent Disposition: Home Diet recommendation:  Discharge Diet Orders (From admission, onward)     Start     Ordered   09/15/22 0000  Diet - low sodium heart healthy        09/15/22 1139           Cardiac and Carb modified diet DISCHARGE MEDICATION: Allergies as of 09/15/2022   No Known Allergies      Medication List     TAKE these medications    aspirin 81 MG chewable tablet Chew  1 tablet (81 mg total) by mouth daily. Start taking on: September 16, 2022   atorvastatin 80 MG tablet Commonly known as: LIPITOR Take 1 tablet (80 mg total) by mouth daily. Start taking on: September 16, 2022   divalproex 500 MG 24 hr tablet Commonly known as: DEPAKOTE ER Take 500 mg by mouth 3 (three) times daily.   ezetimibe 10 MG tablet Commonly known as: ZETIA Take 1 tablet (10 mg total) by mouth daily. Start taking on: September 16, 2022   furosemide 20 MG tablet Commonly known as: LASIX Take 1 tablet (20 mg total) by mouth every other day. Start taking on: September 16, 2022   lamoTRIgine 150 MG tablet Commonly known as: LAMICTAL Take 150 mg by mouth 2 (two) times daily.   metaxalone 800 MG tablet Commonly known as: SKELAXIN Take 800 mg by mouth 3 (three) times daily as needed.   metFORMIN 500 MG 24 hr tablet Commonly known as: GLUCOPHAGE-XR Take 1 tablet (500 mg total) by mouth 2 (two) times daily with a meal.   metoprolol succinate 25 MG 24 hr tablet Commonly known as: TOPROL-XL Take 0.5 tablets (12.5 mg total) by mouth at bedtime.   omeprazole 40 MG capsule Commonly known as: PRILOSEC Take 1 capsule (40 mg total) by mouth daily before breakfast.   potassium chloride 10 MEQ tablet Commonly known as: KLOR-CON M Take 1 tablet (10 mEq total) by mouth every other day. Start taking on: September 16, 2022   prasugrel 10 MG Tabs tablet Commonly known as: EFFIENT Take 1 tablet (10 mg total) by mouth daily. Start taking on: September 16, 2022        Discharge Exam: Ceasar Mons Weights   09/13/22 1720 09/13/22 2020  Weight: 90.7 kg 87.7 kg   General - Middle aged Caucasian male, no apparent distress HEENT - PERRLA, EOMI, atraumatic head, non tender sinuses. Lung - Clear, rales, rhonchi, wheezes. Heart - S1, S2 heard, no murmurs, rubs, trace pedal edema Neuro - Alert, awake and oriented x 3, non focal exam. Skin - Warm and dry. Right hand cath site with no bleeding.  Condition at discharge: stable  The results of significant diagnostics from this hospitalization (including imaging, microbiology, ancillary and laboratory) are listed below for reference.    Imaging Studies: ECHOCARDIOGRAM COMPLETE  Result Date: 09/14/2022    ECHOCARDIOGRAM REPORT   Patient Name:   Aaron Bowers [facility] Date of Exam: 09/14/2022 Medical Rec #:  [id number]         Height:       68.0 in Accession #:    [id number]        Weight:       193.3 lb Date of Birth:  02-Jan-1971         BSA:          2.015 m Patient Age:    52 years          BP:           121/88 mmHg Patient Gender: M                 HR:           96 bpm. Exam Location:  [facility] Procedure: 2D Echo, 3D Echo, Cardiac Doppler, Color Doppler, Strain Analysis and            Intracardiac Opacification Agent Indications:     NSTEMI  History:         Patient has no prior history of Echocardiogram  examinations.                  Acute MI; Risk Factors:Diabetes.  Sonographer:     Mikki Harbor Referring Phys:  [phone] CHRISTOPHER END Diagnosing Phys: Julien Nordmann MD  Sonographer Comments: Global longitudinal strain was attempted. IMPRESSIONS  1. Left ventricular ejection fraction, by estimation, is 35 to 40%. Left ventricular ejection fraction by 3D volume is 45 %. The left ventricle has moderately decreased function. The left ventricle demonstrates regional wall motion abnormalities (mid to  distal anteroroseptal, anterior, periapical, mid to distal lateral walls ). Basal regions best preserved. Left ventricular diastolic parameters are indeterminate. The average left ventricular global longitudinal strain is -6.7 %. The global longitudinal  strain is abnormal.  2. Right ventricular systolic function is normal. The right ventricular size is normal. There is normal pulmonary artery systolic pressure. The estimated right ventricular systolic pressure is 32.8 mmHg.  3. The mitral valve is normal in structure. Moderate mitral valve regurgitation. No evidence of mitral stenosis.  4. The aortic valve is tricuspid. Aortic valve regurgitation is not visualized. No aortic stenosis is present.  5. The inferior vena cava is normal in size with greater  than 50% respiratory variability, suggesting right atrial pressure of 3 mmHg. FINDINGS  Left Ventricle: Left ventricular ejection fraction, by estimation, is 35 to 40%. Left ventricular ejection fraction by 3D volume is 45 %. The left ventricle has moderately decreased function. The left ventricle demonstrates regional wall motion abnormalities. Definity contrast agent was given IV to delineate the left ventricular endocardial borders. The average left ventricular global longitudinal strain is -6.7 %. The global longitudinal strain is abnormal. The left ventricular internal cavity  size was normal in size. There is borderline left ventricular hypertrophy. Left ventricular diastolic parameters are indeterminate. Right Ventricle: The right ventricular size is normal. No increase in right ventricular wall thickness. Right ventricular systolic function is normal. There is normal pulmonary artery systolic pressure. The tricuspid regurgitant velocity is 2.73 m/s, and  with an assumed right atrial pressure of 3 mmHg, the estimated right ventricular systolic pressure is 32.8 mmHg. Left Atrium: Left atrial size was normal in size. Right Atrium: Right atrial size was normal in size. Pericardium: There is no evidence of pericardial effusion. Mitral Valve: The mitral valve is normal in structure. Moderate mitral valve regurgitation. No evidence of mitral valve stenosis. MV peak gradient, 3.3 mmHg. The mean mitral valve gradient is 1.0 mmHg. Tricuspid Valve: The tricuspid valve is normal in structure. Tricuspid valve regurgitation is mild . No evidence of tricuspid stenosis. Aortic Valve: The aortic valve is tricuspid. Aortic valve regurgitation is not visualized. No aortic stenosis is present. Aortic valve mean gradient measures 3.0 mmHg. Aortic valve peak gradient measures 6.0 mmHg. Aortic valve area, by VTI measures 2.18 cm. Pulmonic Valve: The pulmonic valve was normal in structure. Pulmonic valve regurgitation is trivial.  No evidence of pulmonic stenosis. Aorta: The aortic root is normal in size and structure. Venous: The inferior vena cava is normal in size with greater than 50% respiratory variability, suggesting right atrial pressure of 3 mmHg. IAS/Shunts: No atrial level shunt detected by color flow Doppler.  LEFT VENTRICLE PLAX 2D LVIDd:         4.60 cm         Diastology LVIDs:         3.00 cm         LV e' medial:  9.14 cm/s LV PW:  1.10 cm         LV e' lateral: 10.60 cm/s LV IVS:        1.20 cm LVOT diam:     2.00 cm         2D LV SV:         53              Longitudinal LV SV Index:   26              Strain LVOT Area:     3.14 cm        2D Strain GLS  -6.7 %                                Avg:  LV Volumes (MOD)               3D Volume EF LV vol d, MOD    85.2 ml       LV 3D EF:    Left A4C:                                        ventricul LV vol s, MOD    45.6 ml                    ar A4C:                                        ejection LV SV MOD A4C:   85.2 ml                    fraction                                             by 3D                                             volume is                                             45 %.                                 3D Volume EF:                                3D EF:        45 % RIGHT VENTRICLE RV Basal diam:  3.05 cm RV Mid diam:    2.90 cm RV S prime:     16.00 cm/s TAPSE (M-mode): 1.9 cm LEFT ATRIUM             Index        RIGHT ATRIUM  Index LA diam:        3.90 cm 1.94 cm/m   RA Area:     10.50 cm LA Vol (A2C):   35.0 ml 17.37 ml/m  RA Volume:   20.90 ml  10.37 ml/m LA Vol (A4C):   36.4 ml 18.07 ml/m LA Biplane Vol: 37.9 ml 18.81 ml/m  AORTIC VALVE                    PULMONIC VALVE AV Area (Vmax):    2.45 cm     PV Vmax:       1.15 m/s AV Area (Vmean):   2.25 cm     PV Peak grad:  5.3 mmHg AV Area (VTI):     2.18 cm AV Vmax:           122.00 cm/s AV Vmean:          81.600 cm/s AV VTI:            0.242 m AV Peak Grad:      6.0 mmHg AV  Mean Grad:      3.0 mmHg LVOT Vmax:         95.00 cm/s LVOT Vmean:        58.400 cm/s LVOT VTI:          0.168 m LVOT/AV VTI ratio: 0.69  AORTA Ao Root diam: 2.90 cm MITRAL VALVE              TRICUSPID VALVE MV Area (PHT): 3.44 cm   TR Peak grad:   29.8 mmHg MV Area VTI:   2.29 cm   TR Vmax:        273.00 cm/s MV Peak grad:  3.3 mmHg MV Mean grad:  1.0 mmHg   SHUNTS MV Vmax:       0.91 m/s   Systemic VTI:  0.17 m MV Vmean:      53.6 cm/s  Systemic Diam: 2.00 cm Julien Nordmann MD Electronically signed by Julien Nordmann MD Signature Date/Time: 09/14/2022/1:28:36 PM    Final    CARDIAC CATHETERIZATION  Result Date: 09/13/2022 Conclusions: Severe single-vessel coronary artery disease with 99% stenosis of mid LAD involving ostium of moderate-caliber D2 branch.  No significant disease noted in dominant LCx or small RCA. Moderately to severely reduced left ventricular systolic function (LVEF 30-35%) with normal filling pressure (LVEDP 10 mmHg). Successful PCI to mid LAD using Onyx Frontier 2.75 x 38 mm drug-eluting stent (postdilated to 3.1 mm) and angioplasty of jailed D2 branch using 2.0 mm balloon.  Final angiogram shows 0% residual stenosis in the LAD and 30% residual stenosis at the ostium of D2.  There is TIMI-3 flow throughout the left coronary artery. Recommendations: Continue tirofiban infusion for 4 hours. Dual antiplatelet therapy with aspirin and prasugrel for at least 12 months. Aggressive secondary prevention of coronary artery disease. Obtain echocardiogram.  If LVEF confirmed to be less than 35%, consider LifeVest at discharge. [name], MD [facility]  DG Chest Portable 1 View  Result Date: 09/13/2022 CLINICAL DATA:  Chest pain EXAM: PORTABLE CHEST 1 VIEW COMPARISON:  11/02/2006 FINDINGS: Low lung volumes are present, causing crowding of the pulmonary vasculature. The lungs appear clear. Cardiac and mediastinal contours normal. No blunting of the costophrenic angles. No significant bony  abnormality observed. IMPRESSION: 1. Low lung volumes. No active cardiopulmonary disease is radiographically apparent. Electronically Signed   By: Gaylyn Rong M.D.   On: 09/13/2022 18:34    Microbiology: Results  for orders placed or performed during the hospital encounter of 09/13/22  MRSA Next Gen by PCR, Nasal     Status: None   Collection Time: 09/14/22  9:00 AM   Specimen: Nasal Mucosa; Nasal Swab  Result Value Ref Range Status   MRSA by PCR Next Gen NOT DETECTED NOT DETECTED Final    Comment: (NOTE) The GeneXpert MRSA Assay (FDA approved for NASAL specimens only), is one component of a comprehensive MRSA colonization surveillance program. It is not intended to diagnose MRSA infection nor to guide or monitor treatment for MRSA infections. Test performance is not FDA approved in patients less than 12 years old. Performed at [facility], [address] [place], [place] [address]     Labs: CBC: Recent Labs  Lab 09/13/22 1726 09/14/22 0247  WBC 7.3 12.3*  HGB 15.2 14.7  HCT 42.0 41.0  MCV 86.2 87.2  PLT 285 265   Basic Metabolic Panel: Recent Labs  Lab 09/13/22 1726 09/14/22 0247  NA 134* 134*  K 3.8 4.2  CL 97* 100  CO2 23 23  GLUCOSE 343* 233*  BUN 9 8  CREATININE 0.86 0.65  CALCIUM 9.0 8.5*   Liver Function Tests: Recent Labs  Lab 09/13/22 1726  AST 49*  ALT 22  ALKPHOS 89  BILITOT 0.6  PROT 7.7  ALBUMIN 4.0   CBG: Recent Labs  Lab 09/14/22 1116 09/14/22 1610 09/14/22 2146 09/15/22 0737 09/15/22 1128  GLUCAP 251* 121* 161* 177* 218*    Discharge time spent: 38 minutes.  Signed: Marcelino Duster, MD [facility] 09/15/2022

## 2022-09-15 NOTE — Plan of Care (Signed)
  Problem: Activity: Goal: Ability to return to baseline activity level will improve Outcome: Progressing   Problem: Education: Goal: Knowledge of General Education information will improve Description: Including pain rating scale, medication(s)/side effects and non-pharmacologic comfort measures Outcome: Progressing   Problem: Health Behavior/Discharge Planning: Goal: Ability to manage health-related needs will improve Outcome: Progressing   Problem: Clinical Measurements: Goal: Will remain free from infection Outcome: Progressing

## 2022-09-15 NOTE — Inpatient Diabetes Management (Addendum)
Inpatient Diabetes Program Recommendations  AACE/ADA: New Consensus Statement on Inpatient Glycemic Control   Target Ranges:  Prepandial:   less than 140 mg/dL      Peak postprandial:   less than 180 mg/dL (1-2 hours)      Critically ill patients:  140 - 180 mg/dL    Latest Reference Range & Units 09/14/22 07:25 09/14/22 11:16 09/14/22 16:10 09/14/22 21:46 09/15/22 07:37  Glucose-Capillary 70 - 99 mg/dL 147 (H) 829 (H) 562 (H) 161 (H) 177 (H)    Latest Reference Range & Units 09/14/22 02:47  Hemoglobin A1C 4.8 - 5.6 % 7.1 (H)   Review of Glycemic Control  Diabetes history: DM2 Outpatient Diabetes medications: None Current orders for Inpatient glycemic control: Novolog 0-15 units TID with meals, Novolog 0-5 units QHS  Inpatient Diabetes Program Recommendations:    HbgA1C: A1C 7.1% on 09/14/22 indicating an average glucose of 157 mg/dl over the past 2-3 months.  Outpatient: Would recommend to discharge patient on Metformin XR 500 mg BID and have patient follow up with PCP regarding DM management.   NOTE: Noted consult for diabetes coordinator regarding new DM dx. Spoke with patient and fiance at length yesterday. Patient had been told he had borderline DM by PCP over 1 year ago and no follow up since then. Patient will need a PCP (prior PCP office closed). Patient already has a glucometer and testing supplies at home. Patient was not following a carb modified diet prior to admission so he plans to make dietary changes and eliminate sugary beverages.  Given A1C is 7.1% and patient will be making dietary changes would recommend to discharge on Metformin XR 500 mg BID and have patient follow up with PCP.  Addendum 09/15/22@11 :40-Spoke with patient over the phone regarding A1C results. Informed patient that current A1C is 7.1% indicating an average glucose of 197 mg/dl. Discussed that it would be recommended to discharge on Metformin XR 500 mg BID but it would be up to the attending provider as  to what exactly is prescribed. Discussed Metformin, how it works, when to take it, and potential GI symptoms (nausea, diarrhea). Encouraged patient to go ahead and find a new PCP so he can follow up in the near future. Reviewed carb modified diet and encouraged patient to start following and eliminate sugary beverages. Informed patient that outpatient DM education would be ordered. Patient verbalized understanding of information and has no other questions at this time.  Thanks, Orlando Penner, RN, MSN, CDCES Diabetes Coordinator Inpatient Diabetes Program [phone] (Team Pager from 8am to 5pm)

## 2022-09-15 NOTE — TOC Transition Note (Signed)
Transition of Care [facility]) - CM/SW Discharge Note   Patient Details  Name: Aaron Bowers MRN: [id number] Date of Birth: 10/29/1970  Transition of Care [facility]) CM/SW Contact:  Truddie Hidden, RN Phone Number: 09/15/2022, 1:15 PM   Clinical Narrative:    Spoke with patient at the bedside. He stated his fiance would transport him home.   TOC signing off.     Final next level of care: Home/Self Care Barriers to Discharge: Barriers Resolved   Patient Goals and CMS Choice      Discharge Placement                      Patient and family notified of of transfer: 09/15/22  Discharge Plan and Services Additional resources added to the After Visit Summary for                                       Social Determinants of Health (SDOH) Interventions SDOH Screenings   Food Insecurity: No Food Insecurity (09/13/2022)  Housing: Low Risk  (09/13/2022)  Transportation Needs: No Transportation Needs (09/13/2022)  Utilities: Not At Risk (09/13/2022)  Tobacco Use: Low Risk  (09/13/2022)     Readmission Risk Interventions     No data to display

## 2022-09-15 NOTE — Plan of Care (Signed)
  Problem: Education: Goal: Understanding of CV disease, CV risk reduction, and recovery process will improve Outcome: Adequate for Discharge Goal: Individualized Educational Video(s) Outcome: Adequate for Discharge   Problem: Activity: Goal: Ability to return to baseline activity level will improve Outcome: Adequate for Discharge   Problem: Cardiovascular: Goal: Ability to achieve and maintain adequate cardiovascular perfusion will improve Outcome: Adequate for Discharge Goal: Vascular access site(s) Level 0-1 will be maintained Outcome: Adequate for Discharge   Problem: Health Behavior/Discharge Planning: Goal: Ability to safely manage health-related needs after discharge will improve Outcome: Adequate for Discharge   Problem: Education: Goal: Knowledge of General Education information will improve Description: Including pain rating scale, medication(s)/side effects and non-pharmacologic comfort measures Outcome: Adequate for Discharge   Problem: Health Behavior/Discharge Planning: Goal: Ability to manage health-related needs will improve Outcome: Adequate for Discharge   Problem: Clinical Measurements: Goal: Ability to maintain clinical measurements within normal limits will improve Outcome: Adequate for Discharge Goal: Will remain free from infection Outcome: Adequate for Discharge Goal: Diagnostic test results will improve Outcome: Adequate for Discharge Goal: Respiratory complications will improve Outcome: Adequate for Discharge Goal: Cardiovascular complication will be avoided Outcome: Adequate for Discharge   Problem: Activity: Goal: Risk for activity intolerance will decrease Outcome: Adequate for Discharge   Problem: Nutrition: Goal: Adequate nutrition will be maintained Outcome: Adequate for Discharge   Problem: Coping: Goal: Level of anxiety will decrease Outcome: Adequate for Discharge   Problem: Elimination: Goal: Will not experience complications  related to bowel motility Outcome: Adequate for Discharge Goal: Will not experience complications related to urinary retention Outcome: Adequate for Discharge   Problem: Pain Managment: Goal: General experience of comfort will improve Outcome: Adequate for Discharge   Problem: Safety: Goal: Ability to remain free from injury will improve Outcome: Adequate for Discharge   Problem: Skin Integrity: Goal: Risk for impaired skin integrity will decrease Outcome: Adequate for Discharge   Problem: Education: Goal: Ability to describe self-care measures that may prevent or decrease complications (Diabetes Survival Skills Education) will improve Outcome: Adequate for Discharge Goal: Individualized Educational Video(s) Outcome: Adequate for Discharge   Problem: Coping: Goal: Ability to adjust to condition or change in health will improve Outcome: Adequate for Discharge   Problem: Fluid Volume: Goal: Ability to maintain a balanced intake and output will improve Outcome: Adequate for Discharge   Problem: Health Behavior/Discharge Planning: Goal: Ability to identify and utilize available resources and services will improve Outcome: Adequate for Discharge Goal: Ability to manage health-related needs will improve Outcome: Adequate for Discharge   Problem: Metabolic: Goal: Ability to maintain appropriate glucose levels will improve Outcome: Adequate for Discharge   Problem: Nutritional: Goal: Maintenance of adequate nutrition will improve Outcome: Adequate for Discharge Goal: Progress toward achieving an optimal weight will improve Outcome: Adequate for Discharge   Problem: Skin Integrity: Goal: Risk for impaired skin integrity will decrease Outcome: Adequate for Discharge   Problem: Tissue Perfusion: Goal: Adequacy of tissue perfusion will improve Outcome: Adequate for Discharge   

## 2022-09-15 NOTE — Progress Notes (Signed)
Progress Note  Patient Name: Aaron Bowers Date of Encounter: 09/15/2022  Primary Cardiologist: New - consult by End  Subjective   Transferred out of the ICU. Ambulated without issues. Feels "much better." No chest pain or symptoms of cardiac decompensation.   Inpatient Medications    Scheduled Meds:  aspirin  81 mg Oral Daily   atorvastatin  80 mg Oral Daily   Chlorhexidine Gluconate Cloth  6 each Topical Daily   docusate sodium  100 mg Oral BID   enoxaparin (LOVENOX) injection  40 mg Subcutaneous Q24H   ezetimibe  10 mg Oral Daily   insulin aspart  0-15 Units Subcutaneous TID WC   insulin aspart  0-5 Units Subcutaneous QHS   metoprolol succinate  12.5 mg Oral QHS   polyethylene glycol  17 g Oral Daily   prasugrel  10 mg Oral Daily   senna  2 tablet Oral QHS   sodium chloride flush  3 mL Intravenous Q12H   Continuous Infusions:  sodium chloride 10 mL/hr at 09/14/22 0610   sodium chloride     PRN Meds: sodium chloride, acetaminophen, ondansetron (ZOFRAN) IV, sodium chloride flush   Vital Signs    Vitals:   09/14/22 2102 09/14/22 2327 09/15/22 0451 09/15/22 0735  BP: 137/85 108/72 93/66 103/71  Pulse: (!) 102 (!) 101 87 80  Resp: 18 19 18 16   Temp: 98.2 F (36.8 C) 98.5 F (36.9 C) 98.7 F (37.1 C) 98.6 F (37 C)  TempSrc:   Oral   SpO2: 99% 95% 97% 97%  Weight:      Height:        Intake/Output Summary (Last 24 hours) at 09/15/2022 0900 Last data filed at 09/14/2022 1927 Gross per 24 hour  Intake 240 ml  Output --  Net 240 ml   Filed Weights   09/13/22 1720 09/13/22 2020  Weight: 90.7 kg 87.7 kg    Telemetry    SR, 90s bpm - Personally Reviewed  ECG    No new tracings - Personally Reviewed  Physical Exam   GEN: No acute distress.   Neck: No JVD. Cardiac: RRR, no murmurs, rubs, or gallops.  Respiratory: Clear to auscultation bilaterally.  GI: Soft, nontender, non-distended.   MS: No edema; No deformity. Neuro:  Alert and oriented x  3; Nonfocal.  Psych: Normal affect.  Labs    Chemistry Recent Labs  Lab 09/13/22 1726 09/14/22 0247  NA 134* 134*  K 3.8 4.2  CL 97* 100  CO2 23 23  GLUCOSE 343* 233*  BUN 9 8  CREATININE 0.86 0.65  CALCIUM 9.0 8.5*  PROT 7.7  --   ALBUMIN 4.0  --   AST 49*  --   ALT 22  --   ALKPHOS 89  --   BILITOT 0.6  --   GFRNONAA >60 >60  ANIONGAP 14 11     Hematology Recent Labs  Lab 09/13/22 1726 09/14/22 0247  WBC 7.3 12.3*  RBC 4.87 4.70  HGB 15.2 14.7  HCT 42.0 41.0  MCV 86.2 87.2  MCH 31.2 31.3  MCHC 36.2* 35.9  RDW 11.9 11.8  PLT 285 265    Cardiac EnzymesNo results for input(s): "TROPONINI" in the last 168 hours. No results for input(s): "TROPIPOC" in the last 168 hours.   BNPNo results for input(s): "BNP", "PROBNP" in the last 168 hours.   DDimer No results for input(s): "DDIMER" in the last 168 hours.   Radiology    DG Chest  Portable 1 View  Result Date: 09/13/2022 IMPRESSION: 1. Low lung volumes. No active cardiopulmonary disease is radiographically apparent. Electronically Signed   By: Gaylyn Rong M.D.   On: 09/13/2022 18:34    Cardiac Studies   LHC 09/13/2022: Conclusions: Severe single-vessel coronary artery disease with 99% stenosis of mid LAD involving ostium of moderate-caliber D2 branch.  No significant disease noted in dominant LCx or small RCA. Moderately to severely reduced left ventricular systolic function (LVEF 30-35%) with normal filling pressure (LVEDP 10 mmHg). Successful PCI to mid LAD using Onyx Frontier 2.75 x 38 mm drug-eluting stent (postdilated to 3.1 mm) and angioplasty of jailed D2 branch using 2.0 mm balloon.  Final angiogram shows 0% residual stenosis in the LAD and 30% residual stenosis at the ostium of D2.  There is TIMI-3 flow throughout the left coronary artery.   Recommendations: Continue tirofiban infusion for 4 hours. Dual antiplatelet therapy with aspirin and prasugrel for at least 12 months. Aggressive  secondary prevention of coronary artery disease. Obtain echocardiogram.  If LVEF confirmed to be less than 35%, consider LifeVest at discharge. __________  2D echo 09/14/2022: 1. Left ventricular ejection fraction, by estimation, is 35 to 40%. Left  ventricular ejection fraction by 3D volume is 45 %. The left ventricle has  moderately decreased function. The left ventricle demonstrates regional  wall motion abnormalities (mid to   distal anteroroseptal, anterior, periapical, mid to distal lateral walls  ). Basal regions best preserved. Left ventricular diastolic parameters are  indeterminate. The average left ventricular global longitudinal strain is  -6.7 %. The global longitudinal   strain is abnormal.   2. Right ventricular systolic function is normal. The right ventricular  size is normal. There is normal pulmonary artery systolic pressure. The  estimated right ventricular systolic pressure is 32.8 mmHg.   3. The mitral valve is normal in structure. Moderate mitral valve  regurgitation. No evidence of mitral stenosis.   4. The aortic valve is tricuspid. Aortic valve regurgitation is not  visualized. No aortic stenosis is present.   5. The inferior vena cava is normal in size with greater than 50%  respiratory variability, suggesting right atrial pressure of 3 mmHg.   Patient Profile     52 y.o. male with history of borderline diabetes, borderline hyperlipidemia, and bipolar disorder who we are seeing for the evaluation of high risk NSTEMI and acute HFrEF secondary to ICM.  Assessment & Plan    1.  High risk NSTEMI: -No chest pain or symptoms of cardiac decompensation -Has ambulated without cardiac limitation -[place]roponin peaked at [address] -[facility] 09/13/2022 showed severe single-vessel CAD with 99% stenosis of the mid LAD involving the ostium of moderate caliber D2 branch with no significant disease noted in a dominant LCx or small RCA -He underwent successful PCI to mid LAD and  angioplasty of jailed D2 branch as outlined above -Continue DAPT with aspirin 81 mg and prasugrel 10 mg daily without interruption for at least 12 months dating back to date of PCI (09/13/2022) -Cath instructions with no lifting over 5 pounds for the next 7 days, no submerging arteriotomy site underwater for 7 days, no sexual intercourse for 7 days, no swimming for 7 days -Okay for out of work until seen by cardiology in the office -Cardiac rehab  2.  Acute HFrEF secondary to ICM: -Euvolemic and well compensated -No indication for LifeVest at this time given EF of 40% and no evidence of significant ventricular ectopy on telemetry -Continue Toprol-XL -Add Lasix  20 mg every other day with KCl 10 mEq every other day -Relative hypotension precludes further escalation of GDMT, would look to escalate evidence-based medical therapy with addition of MRA and SGLT2 inhibitor along with possible ARB versus ARNI in office follow-up -Recommend repeating echo in 3 months time following PCI and optimization of GDMT  3.  HLD: -LDL 140 this admission with goal being less than 55 -Now on atorvastatin 80 mg and ezetimibe -Will need follow-up fasting lipid panel and LFT in 2 to 3 months with recommendation to escalate lipid-lowering therapy as indicated to achieve target LDL, may need to consider PCSK9 inhibitor  4.  Newly diagnosed diabetes: -Will need follow-up with PCP -Now on metformin       For questions or updates, please contact [facility] Please consult [email or web address] for contact info under Cardiology/STEMI.    Signed, Eula Listen, PA-C [facility] Pager: [phone] 09/15/2022, 9:00 AM

## 2022-09-16 ENCOUNTER — Telehealth: Payer: Self-pay

## 2022-09-16 NOTE — Telephone Encounter (Signed)
Please advise If ok to refill. Pt hosp. Pt has no future appointment and hasn't been seen in clinic but was consulted by Dr. Okey Dupre.

## 2022-09-16 NOTE — Telephone Encounter (Signed)
Lvm for patient to call our office if he would like to establish care with one of our providers that are accepting new patients

## 2022-09-22 ENCOUNTER — Encounter: Payer: Self-pay | Admitting: Physician Assistant

## 2022-09-22 ENCOUNTER — Ambulatory Visit: Payer: 59 | Admitting: Internal Medicine

## 2022-09-22 ENCOUNTER — Ambulatory Visit: Payer: 59 | Attending: Internal Medicine | Admitting: Physician Assistant

## 2022-09-22 ENCOUNTER — Encounter: Payer: 59 | Attending: Internal Medicine | Admitting: Dietician

## 2022-09-22 ENCOUNTER — Encounter: Payer: Self-pay | Admitting: Dietician

## 2022-09-22 VITALS — BP 124/76 | HR 76 | Ht 68.0 in | Wt 191.4 lb

## 2022-09-22 DIAGNOSIS — I214 Non-ST elevation (NSTEMI) myocardial infarction: Secondary | ICD-10-CM | POA: Diagnosis not present

## 2022-09-22 DIAGNOSIS — Z79899 Other long term (current) drug therapy: Secondary | ICD-10-CM

## 2022-09-22 DIAGNOSIS — Z0279 Encounter for issue of other medical certificate: Secondary | ICD-10-CM

## 2022-09-22 DIAGNOSIS — E119 Type 2 diabetes mellitus without complications: Secondary | ICD-10-CM | POA: Insufficient documentation

## 2022-09-22 DIAGNOSIS — I2510 Atherosclerotic heart disease of native coronary artery without angina pectoris: Secondary | ICD-10-CM | POA: Diagnosis not present

## 2022-09-22 DIAGNOSIS — I5020 Unspecified systolic (congestive) heart failure: Secondary | ICD-10-CM

## 2022-09-22 DIAGNOSIS — I255 Ischemic cardiomyopathy: Secondary | ICD-10-CM

## 2022-09-22 DIAGNOSIS — E785 Hyperlipidemia, unspecified: Secondary | ICD-10-CM

## 2022-09-22 DIAGNOSIS — Z713 Dietary counseling and surveillance: Secondary | ICD-10-CM | POA: Diagnosis not present

## 2022-09-22 MED ORDER — POTASSIUM CHLORIDE CRYS ER 10 MEQ PO TBCR: 10.0000 meq | EXTENDED_RELEASE_TABLET | ORAL | 3 refills | Status: DC

## 2022-09-22 MED ORDER — FUROSEMIDE 20 MG PO TABS: 20.0000 mg | ORAL_TABLET | ORAL | 3 refills | Status: DC

## 2022-09-22 MED ORDER — LOSARTAN POTASSIUM 25 MG PO TABS
12.5000 mg | ORAL_TABLET | Freq: Every day | ORAL | 3 refills | Status: DC
Start: 2022-09-22 — End: 2022-12-15

## 2022-09-22 NOTE — Progress Notes (Signed)
Diabetes Self-Management Education  Visit Type: First/Initial  Appt. Start Time: 0815 Appt. End Time: 945  09/22/2022  [name], identified by name and date of birth, is a 52 y.o. male with a diagnosis of Diabetes: Type 2.   ASSESSMENT  There were no vitals taken for this visit. There is no height or weight on file to calculate BMI.  Pt reports having a heart attack (NSTEMI) 09/23/2022, hospitalized for 1-2 days, new dx CAD, most recent labs indicate HLD (04/14/2022: TC - 224, TGL - 264, HDL - 31, LDL - 140, LPA - 96.2) Pt reports getting a new glucometer with [employer] connectivity, pt brought log to visit. FBG- now ~140, previously >170 CBG- 130 - 150 Pt reports history of working long hours (~9 years), would only sleep 4-5 hours. Pt reports working part time at [employer] 5 days a week, would eat/snack there while at work. Pt states they made a change this past year to work less hours and try to get more sleep. Pt reports they have always been a busy body, put work before themselves.  Pt reports having a difficult time sticking to a dietary plan, wants to be able to find something they can stick to long term. Pt reports no set time when they eat at work, typically pick up fast food breakfast on the way to work. Most foods are from away from home. Pt reports trying different Zero sugar sodas and zero sugar flavored water, used to drink a lot of regular soda.   Diabetes Self-Management Education - 09/22/22 0848       Visit Information   Visit Type First/Initial      Initial Visit   Diabetes Type Type 2    Date Diagnosed 09/13/2022    Are you currently following a meal plan? No    Are you taking your medications as prescribed? Yes      Health Coping   How would you rate your overall health? Fair      Psychosocial Assessment   Patient Belief/Attitude about Diabetes Motivated to manage diabetes    What is the hardest part about your diabetes right now, causing you the  most concern, or is the most worrisome to you about your diabetes?   Making healty food and beverage choices    Self-care barriers None    Self-management support Doctor's office;Family    Other persons present Patient    Patient Concerns Healthy Lifestyle;Nutrition/Meal planning;Problem Solving    Special Needs None    Preferred Learning Style No preference indicated    Learning Readiness Ready    How often do you need to have someone help you when you read instructions, pamphlets, or other written materials from your doctor or pharmacy? 1 - Never    What is the last grade level you completed in school? 12th grade      Pre-Education Assessment   Patient understands the diabetes disease and treatment process. Needs Instruction    Patient understands incorporating nutritional management into lifestyle. Needs Instruction    Patient undertands incorporating physical activity into lifestyle. Needs Instruction    Patient understands using medications safely. Needs Instruction    Patient understands monitoring blood glucose, interpreting and using results Needs Instruction    Patient understands prevention, detection, and treatment of acute complications. Needs Instruction    Patient understands prevention, detection, and treatment of chronic complications. Needs Instruction    Patient understands how to develop strategies to address psychosocial issues. Needs Instruction  Patient understands how to develop strategies to promote health/change behavior. Needs Instruction      Complications   Last HgB A1C per patient/outside source 7.1 %   09/14/2022   How often do you check your blood sugar? 1-2 times/day    Fasting Blood glucose range (mg/dL) [phone]    Postprandial Blood glucose range (mg/dL) [phone]    Have you had a dilated eye exam in the past 12 months? No    Have you had a dental exam in the past 12 months? No    Are you checking your feet? Yes    How many days per week are you  checking your feet? 6      Dietary Intake   Breakfast Egg biscuit, hashbrowns, half sweet tea ([employer])    Lunch Bologna sandwich, wheat bread, mayonnaise    Dinner Hamburger, ketchup and mayo, small fries, half sweet tea ([employer])    Beverage(s) Half sweet tea, water      Activity / Exercise   Activity / Exercise Type ADL's    How many days per week do you exercise? 0    How many minutes per day do you exercise? 0    Total minutes per week of exercise 0      Patient Education   Previous Diabetes Education No    Disease Pathophysiology Factors that contribute to the development of diabetes;Explored patient's options for treatment of their diabetes    Healthy Eating Role of diet in the treatment of diabetes and the relationship between the three main macronutrients and blood glucose level;Meal options for control of blood glucose level and chronic complications.;Information on hints to eating out and maintain blood glucose control.;Plate Method    Being Active Role of exercise on diabetes management, blood pressure control and cardiac health.    Medications Reviewed patients medication for diabetes, action, purpose, timing of dose and side effects.    Monitoring Taught/evaluated SMBG meter.   Appropriate finger stick method   Chronic complications Lipid levels, blood glucose control and heart disease;Relationship between chronic complications and blood glucose control    Diabetes Stress and Support Role of stress on diabetes;Identified and addressed patients feelings and concerns about diabetes    Lifestyle and Health Coping Lifestyle issues that need to be addressed for better diabetes care   Work/Life balance     Individualized Goals (developed by patient)   Nutrition Follow meal plan discussed;General guidelines for healthy choices and portions discussed    Medications take my medication as prescribed    Monitoring  Test my blood glucose as discussed    Problem Solving Sleep  Pattern;Eating Pattern    Reducing Risk examine blood glucose patterns      Post-Education Assessment   Patient understands the diabetes disease and treatment process. Needs Review    Patient understands incorporating nutritional management into lifestyle. Needs Review    Patient undertands incorporating physical activity into lifestyle. Needs Review    Patient understands using medications safely. Comphrehends key points    Patient understands monitoring blood glucose, interpreting and using results Comprehends key points    Patient understands prevention, detection, and treatment of acute complications. N/A    Patient understands prevention, detection, and treatment of chronic complications. Needs Review    Patient understands how to develop strategies to address psychosocial issues. Needs Review    Patient understands how to develop strategies to promote health/change behavior. Needs Review      Outcomes   Expected Outcomes Demonstrated interest in  learning but significant barriers to change    Future DMSE 2 months    Program Status Not Completed             Individualized Plan for Diabetes Self-Management Training:   Learning Objective:  Patient will have a greater understanding of diabetes self-management. Patient education plan is to attend individual and/or group sessions per assessed needs and concerns.   Plan:   Patient Instructions  Continue to choose ZERO sugar beverages, have less carbonated beverages later in the day to help prevent reflux.  Work really hard to identify sources of saturated fats in your diet. Remember, these come mostly from ANIMAL products and FRIED foods. Eating less of these as often as possible will help lower your cholesterol and blood pressure!  When choosing packaged foods, choose foods that have 1 g or less of saturated fat per serving.  Plan on taking a walk of 15-30 minutes in the afternoon between jobs. Try to move at a pace that gets  your breathing a little harder but not breaking a sweat. Ask your cardiac rehab provider about exercise restrictions and appropriate duration and intensity for you.   Expected Outcomes:  Demonstrated interest in learning but significant barriers to change  Education material provided: My Plate, Saturated fat worksheet  If problems or questions, patient to contact team via:  Phone and Email  Future DSME appointment: 2 months

## 2022-09-22 NOTE — Progress Notes (Signed)
Cardiology Office Note    Date:  09/22/2022   ID:  Aaron Bowers, DOB Jul 02, 1970, MRN [id number]  PCP:  Pcp, No  Cardiologist:  [name], MD  Electrophysiologist:  None   Chief Complaint: Hospital follow up  History of Present Illness:   Aaron Bowers is a 52 y.o. male with history of CAD with NSTEMI status post PCI/DES to the mid LAD with PTCA to a jailed D2 in 09/2022, HFrEF secondary to ICM, HLD, recently diagnosed diabetes, and bipolar disorder who presents for hospital follow-up as outlined below.  He was admitted to the hospital from 9/10 through 09/15/2022 after having progressive intermittent exertional chest discomfort with strenuous activities over the preceding several months and was found to have an NSTEMI. High-sensitivity troponin [place] at [address].  LHC showed severe single-vessel CAD with 99% stenosis of the mid LAD involving the ostium of a moderate caliber D2 branch.  There was no significant disease noted in the dominant LCx or small RCA.  Moderately to severely reduced LV systolic function with an EF of 30 to 35% with normal filling pressure with an LVEDP of 10 mmHg.  He underwent successful PCI/DES to the mid LAD and angioplasty of jailed D2.  Final angiogram showed 0% residual stenosis in the LAD with 30% residual stenosis at the ostium of D2 with TIMI-3 flow throughout the left coronary artery.  Postintervention echo showed an EF of 35 to 40% with wall motion abnormality involving the mid to distal anteroseptal, anterior, periapical, and mid to distal lateral walls with the basal regions best preserved, normal RV systolic function, ventricular cavity size, and RVSP, moderate mitral regurgitation, and an estimated right atrial pressure of 3 mmHg.  He comes in doing well from a cardiac perspective and is without symptoms of angina or cardiac decompensation.  He has noted a couple episodes of short-lived occurring chest discomfort that will typically last less than a  minute and spontaneously resolved.  Symptoms do not feel similar to his angina leading up to his MI.  No progressive dyspnea, lower extremity swelling, or orthopnea.  Working with a Data processing manager.  Adherent and tolerating cardiac medications including DAPT.  No falls or symptoms concerning for bleeding.  Eager to participate with cardiac rehab.  Remains out of work given strenuous job function on the recovers.  Working on obtaining new PCP.   Labs independently reviewed: 09/2022 - TC 224, TG 264, HDL 31, LDL 140, A1c 7.1, LP(a) 96.2, Hgb 14.7, PLT 265, potassium 4.2, BUN 8, serum creatinine 0.65, albumin 4.0, AST 49, ALT normal  Past Medical History:  Diagnosis Date   Bipolar disorder (HCC)    Borderline hyperlipidemia    CAD (coronary artery disease)    Diabetes mellitus (HCC)    Hyperlipemia     Past Surgical History:  Procedure Laterality Date   COLONOSCOPY WITH PROPOFOL N/A 05/13/2020   Procedure: COLONOSCOPY WITH PROPOFOL;  Surgeon: Toney Reil, MD;  Location: [facility] ENDOSCOPY;  Service: Gastroenterology;  Laterality: N/A;   CORONARY STENT INTERVENTION N/A 09/13/2022   Procedure: CORONARY STENT INTERVENTION;  Surgeon: [name], MD;  Location: [facility] INVASIVE CV LAB;  Service: Cardiovascular;  Laterality: N/A;   ESOPHAGOGASTRODUODENOSCOPY (EGD) WITH PROPOFOL N/A 05/13/2020   Procedure: ESOPHAGOGASTRODUODENOSCOPY (EGD) WITH PROPOFOL;  Surgeon: Toney Reil, MD;  Location: [facility] ENDOSCOPY;  Service: Gastroenterology;  Laterality: N/A;   LEFT HEART CATH AND CORONARY ANGIOGRAPHY N/A 09/13/2022   Procedure: LEFT HEART CATH AND CORONARY ANGIOGRAPHY;  Surgeon: [name], MD;  Location: [facility] INVASIVE CV LAB;  Service: Cardiovascular;  Laterality: N/A;    Current Medications: Current Meds  Medication Sig   aspirin 81 MG chewable tablet Chew 1 tablet (81 mg total) by mouth daily.   atorvastatin (LIPITOR) 80 MG tablet Take 1 tablet (80 mg total) by mouth daily.   divalproex  (DEPAKOTE ER) 500 MG 24 hr tablet Take 500 mg by mouth 3 (three) times daily.   ezetimibe (ZETIA) 10 MG tablet Take 1 tablet (10 mg total) by mouth daily.   furosemide (LASIX) 20 MG tablet Take 1 tablet (20 mg total) by mouth every other day.   lamoTRIgine (LAMICTAL) 150 MG tablet Take 150 mg by mouth 2 (two) times daily.   metFORMIN (GLUCOPHAGE-XR) 500 MG 24 hr tablet Take 1 tablet (500 mg total) by mouth 2 (two) times daily with a meal.   metoprolol succinate (TOPROL-XL) 25 MG 24 hr tablet Take 0.5 tablets (12.5 mg total) by mouth at bedtime.   potassium chloride (KLOR-CON M) 10 MEQ tablet Take 1 tablet (10 mEq total) by mouth every other day.   prasugrel (EFFIENT) 10 MG TABS tablet Take 1 tablet (10 mg total) by mouth daily.    Allergies:   Patient has no known allergies.   Social History   Socioeconomic History   Marital status: Single    Spouse name: Not on file   Number of children: Not on file   Years of education: Not on file   Highest education level: Not on file  Occupational History   Not on file  Tobacco Use   Smoking status: Never   Smokeless tobacco: Never  Substance and Sexual Activity   Alcohol use: Not Currently   Drug use: Never   Sexual activity: Not on file  Other Topics Concern   Not on file  Social History Narrative   Not on file   Social Determinants of Health   Financial Resource Strain: Not on file  Food Insecurity: No Food Insecurity (09/13/2022)   Hunger Vital Sign    Worried About Running Out of Food in the Last Year: Never true    Ran Out of Food in the Last Year: Never true  Transportation Needs: No Transportation Needs (09/13/2022)   PRAPARE - Administrator, Civil Service (Medical): No    Lack of Transportation (Non-Medical): No  Physical Activity: Not on file  Stress: Not on file  Social Connections: Not on file     Family History:  The patient's family history is not on file.  ROS:   12-point review of systems is  negative unless otherwise noted in the HPI.   EKGs/Labs/Other Studies Reviewed:    Studies reviewed were summarized above. The additional studies were reviewed today:  2\D echo 09/14/2022: 1. Left ventricular ejection fraction, by estimation, is 35 to 40%. Left  ventricular ejection fraction by 3D volume is 45 %. The left ventricle has  moderately decreased function. The left ventricle demonstrates regional  wall motion abnormalities (mid to   distal anteroroseptal, anterior, periapical, mid to distal lateral walls). Basal regions best preserved. Left ventricular diastolic parameters are  indeterminate. The average left ventricular global longitudinal strain is  -6.7 %. The global longitudinal strain is abnormal.   2. Right ventricular systolic function is normal. The right ventricular  size is normal. There is normal pulmonary artery systolic pressure. The  estimated right ventricular systolic pressure is 32.8 mmHg.   3. The mitral valve is normal in structure. Moderate  mitral valve  regurgitation. No evidence of mitral stenosis.   4. The aortic valve is tricuspid. Aortic valve regurgitation is not  visualized. No aortic stenosis is present.   5. The inferior vena cava is normal in size with greater than 50%  respiratory variability, suggesting right atrial pressure of 3 mmHg.  __________  LHC 09/13/2022: Conclusions: Severe single-vessel coronary artery disease with 99% stenosis of mid LAD involving ostium of moderate-caliber D2 branch.  No significant disease noted in dominant LCx or small RCA. Moderately to severely reduced left ventricular systolic function (LVEF 30-35%) with normal filling pressure (LVEDP 10 mmHg). Successful PCI to mid LAD using Onyx Frontier 2.75 x 38 mm drug-eluting stent (postdilated to 3.1 mm) and angioplasty of jailed D2 branch using 2.0 mm balloon.  Final angiogram shows 0% residual stenosis in the LAD and 30% residual stenosis at the ostium of D2.  There is  TIMI-3 flow throughout the left coronary artery.   Recommendations: Continue tirofiban infusion for 4 hours. Dual antiplatelet therapy with aspirin and prasugrel for at least 12 months. Aggressive secondary prevention of coronary artery disease. Obtain echocardiogram.  If LVEF confirmed to be less than 35%, consider LifeVest at discharge.   EKG:  EKG is ordered today.  The EKG ordered today demonstrates NSR, 76 bpm, global T wave inversion consistent with recent MI  Recent Labs: 09/13/2022: ALT 22 09/14/2022: BUN 8; Creatinine, Ser 0.65; Hemoglobin 14.7; Platelets 265; Potassium 4.2; Sodium 134  Recent Lipid Panel    Component Value Date/Time   CHOL 224 (H) 09/14/2022 0247   TRIG 264 (H) 09/14/2022 0247   HDL 31 (L) 09/14/2022 0247   CHOLHDL 7.2 09/14/2022 0247   VLDL 53 (H) 09/14/2022 0247   LDLCALC 140 (H) 09/14/2022 0247    PHYSICAL EXAM:    VS:  BP 124/76 (BP Location: Left Arm, Patient Position: Sitting, Cuff Size: Normal)   Pulse 76   Ht 5\' 8"  (1.727 m)   Wt 191 lb 6 oz (86.8 kg)   SpO2 97%   BMI 29.10 kg/m   BMI: Body mass index is 29.1 kg/m.  Physical Exam Vitals reviewed.  Constitutional:      Appearance: He is well-developed.  HENT:     Head: Normocephalic and atraumatic.  Eyes:     General:        Right eye: No discharge.        Left eye: No discharge.  Cardiovascular:     Rate and Rhythm: Normal rate and regular rhythm.     Heart sounds: Normal heart sounds, S1 normal and S2 normal. Heart sounds not distant. No midsystolic click and no opening snap. No murmur heard.    No friction rub.     Comments: Right radial arteriotomy site is well-healed without active bleeding, bruising, swelling, warmth, or erythema.  No TTP.  Radial pulse 2+ proximal and distal to the arteriotomy site. Pulmonary:     Effort: Pulmonary effort is normal. No respiratory distress.     Breath sounds: Normal breath sounds. No decreased breath sounds, wheezing or rales.  Chest:      Chest wall: No tenderness.  Abdominal:     General: There is no distension.  Musculoskeletal:     Cervical back: Normal range of motion.     Right lower leg: No edema.     Left lower leg: No edema.  Skin:    General: Skin is warm and dry.     Nails: There is no clubbing.  Neurological:     Mental Status: He is alert and oriented to person, place, and time.  Psychiatric:        Speech: Speech normal.        Behavior: Behavior normal.        Thought Content: Thought content normal.        Judgment: Judgment normal.     Wt Readings from Last 3 Encounters:  09/22/22 191 lb 6 oz (86.8 kg)  09/13/22 193 lb 5.5 oz (87.7 kg)  09/29/21 198 lb 8 oz (90 kg)     ASSESSMENT & PLAN:   CAD involving the native coronary arteries with high risk NSTEMI without angina: He is doing well and without symptoms concerning for angina or cardiac decompensation.  Continue DAPT with aspirin 81 mg and Effient 10 mg daily without interruption for a minimum of 12 months dating back to date of PCI (09/13/2022).  Continue aggressive risk factor modification and secondary prevention including atorvastatin, ezetimibe, and metoprolol succinate.  Okay to participate with cardiac rehab.  Given the strenuous nature of troponin in the context of high risk NSTEMI with acute HFrEF/ICM he will be out of work through 11/03/2022 to allow for cardiac recovery and for rehabilitation.  Working with a Data processing manager.  No indication for further ischemic testing at this time.  No falls or symptoms concerning for bleeding on DAPT.  Check CBC with BMP next week.  HFrEF secondary to ICM: Diagnosed in the setting of his MI.  He is euvolemic and well compensated with NYHA class II symptoms.  Escalate GDMT with addition of losartan 12.5 mg daily with recommendation to transition this to ARNI if possible moving forward.  Check BMP 1 week after initiating ARB.  Moving forward, would look to further escalate GDMT with SGLT2 inhibitor, particularly  with newly diagnosed diabetes, and MRA as able.  Continue metoprolol succinate 12.5 mg daily and furosemide 20 mg every other day with KCl 10 mEq every other day.  No indication for LifeVest with EF of 40%.  Plan for follow-up echo to reevaluate his cardiomyopathy following PCI and optimization of maximally tolerated GDMT in mid 12/2022.  HLD: LDL 140 with target LDL less than 55.  Now on atorvastatin 80 mg and ezetimibe.  Repeat fasting lipid panel and LFT in 2 months with recommendation to escalate lipid-lowering therapy as indicated to achieve target LDL.  Newly diagnosed diabetes: A1c 7.1 during recent admission.  Now on metformin.  Would benefit from addition of SGLT2 inhibitor given concomitant cardiomyopathy.  Has been referred to PCP.    Disposition: F/u with Dr. Okey Dupre or an APP in 1 month.   Medication Adjustments/Labs and Tests Ordered: Current medicines are reviewed at length with the patient today.  Concerns regarding medicines are outlined above. Medication changes, Labs and Tests ordered today are summarized above and listed in the Patient Instructions accessible in Encounters.   Signed, Eula Listen, PA-C 09/22/2022 4:34 PM     [facility] [place] [address] [place], [place] [address] [phone]

## 2022-09-22 NOTE — Patient Instructions (Signed)
Medication Instructions:  Your physician recommends the following medication changes.  START TAKING: Losartan 12.5 mg daily  *If you need a refill on your cardiac medications before your next appointment, please call your pharmacy*   Lab Work: Your provider would like for you to return in 1 week to have the following labs drawn: BMT and CBC.   Please go to [facility] [address] ([facility] [address], [place] [address] You do not need an appointment.  They are open from 8:00 am-4 pm.  Lunch from 1:00 pm- 2:00 pm You not  need to be fasting.   You may also go to any of these [facility] locations:  [facility]  - [address] [employer] [address] Chief Technology Officer)     If you have labs (blood work) drawn today and your tests are completely normal, you will receive your results only by: [employer] (if you have MyChart) OR A paper copy in the mail If you have any lab test that is abnormal or we need to change your treatment, we will call you to review the results.   Follow-Up: At [facility], you and your health needs are our priority.  As part of our continuing mission to provide you with exceptional heart care, we have created designated Provider Care Teams.  These Care Teams include your primary Cardiologist (physician) and Advanced Practice Providers (APPs -  Physician Assistants and Nurse Practitioners) who all work together to provide you with the care you need, when you need it.  We recommend signing up for the patient portal called "MyChart".  Sign up information is provided on this After Visit Summary.  MyChart is used to connect with patients for Virtual Visits (Telemedicine).  Patients are able to view lab/test results, encounter notes, upcoming appointments, etc.  Non-urgent messages can be sent to your provider as well.   To learn more about what you can do with MyChart, go to [email or web address].    Your next appointment:   1  month(s)  Provider:   You may see [name], MD or one of the following Advanced Practice Providers on your designated Care Team:   Nicolasa Ducking, NP Eula Listen, PA-C Cadence Fransico Michael, PA-C Charlsie Quest, NP    Other Instructions Referral to Gavin Potters  for PCP Work Note  provided

## 2022-09-22 NOTE — Patient Instructions (Addendum)
Continue to choose ZERO sugar beverages, have less carbonated beverages later in the day to help prevent reflux.  Work really hard to identify sources of saturated fats in your diet. Remember, these come mostly from ANIMAL products and FRIED foods. Eating less of these as often as possible will help lower your cholesterol and blood pressure!  When choosing packaged foods, choose foods that have 1 g or less of saturated fat per serving.  Plan on taking a walk of 15-30 minutes in the afternoon between jobs. Try to move at a pace that gets your breathing a little harder but not breaking a sweat. Ask your cardiac rehab provider about exercise restrictions and appropriate duration and intensity for you.

## 2022-09-23 ENCOUNTER — Telehealth: Payer: Self-pay | Admitting: Physician Assistant

## 2022-09-23 NOTE — Telephone Encounter (Signed)
Received forms from patient for FMLA and DIsability([employer]) Completed patient authorization forms scanned into chart. Placed forms in Nurse box for [employer], [place] for completion. Billing forms faxed to billing.

## 2022-09-27 DIAGNOSIS — Z955 Presence of coronary angioplasty implant and graft: Secondary | ICD-10-CM

## 2022-09-27 DIAGNOSIS — I214 Non-ST elevation (NSTEMI) myocardial infarction: Secondary | ICD-10-CM

## 2022-09-27 NOTE — Telephone Encounter (Signed)
Received completed forms from Eula Listen, [place]. Scanned completed forms into chart. Called and informed forms available for pick up. Patient states he will be by today to pick up forms.

## 2022-09-27 NOTE — Telephone Encounter (Signed)
Pt is calling f/u on forms being filled out. He stated he'd like to pick them up to do so that he can turn them in at work tomorrow. Please advise.

## 2022-09-27 NOTE — Progress Notes (Signed)
Initial phone call completed. Diagnosis can be found in [facility] 9/10. EP Orientation scheduled for Wednesday 10/2 8am.

## 2022-09-29 ENCOUNTER — Other Ambulatory Visit: Payer: Self-pay

## 2022-09-29 DIAGNOSIS — I214 Non-ST elevation (NSTEMI) myocardial infarction: Secondary | ICD-10-CM

## 2022-09-29 DIAGNOSIS — Z79899 Other long term (current) drug therapy: Secondary | ICD-10-CM

## 2022-09-30 ENCOUNTER — Other Ambulatory Visit: Payer: Self-pay | Admitting: Emergency Medicine

## 2022-09-30 DIAGNOSIS — Z79899 Other long term (current) drug therapy: Secondary | ICD-10-CM

## 2022-09-30 DIAGNOSIS — E875 Hyperkalemia: Secondary | ICD-10-CM

## 2022-09-30 LAB — CBC
Hematocrit: 48.6 % (ref 37.5–51.0)
Hemoglobin: 16 g/dL (ref 13.0–17.7)
MCH: 30.7 pg (ref 26.6–33.0)
MCHC: 32.9 g/dL (ref 31.5–35.7)
MCV: 93 fL (ref 79–97)
Platelets: 347 10*3/uL (ref 150–450)
RBC: 5.22 x10E6/uL (ref 4.14–5.80)
RDW: 12.5 % (ref 11.6–15.4)
WBC: 5.9 10*3/uL (ref 3.4–10.8)

## 2022-09-30 LAB — BASIC METABOLIC PANEL
BUN/Creatinine Ratio: 10 (ref 9–20)
BUN: 9 mg/dL (ref 6–24)
CO2: 25 mmol/L (ref 20–29)
Calcium: 9.7 mg/dL (ref 8.7–10.2)
Chloride: 98 mmol/L (ref 96–106)
Creatinine, Ser: 0.92 mg/dL (ref 0.76–1.27)
Glucose: 142 mg/dL — ABNORMAL HIGH (ref 70–99)
Potassium: 5 mmol/L (ref 3.5–5.2)
Sodium: 139 mmol/L (ref 134–144)
eGFR: 100 mL/min/{1.73_m2} (ref >59)

## 2022-10-05 ENCOUNTER — Encounter: Payer: 59 | Attending: Internal Medicine

## 2022-10-05 VITALS — Ht 69.0 in | Wt 190.9 lb

## 2022-10-05 DIAGNOSIS — Z955 Presence of coronary angioplasty implant and graft: Secondary | ICD-10-CM | POA: Insufficient documentation

## 2022-10-05 DIAGNOSIS — I214 Non-ST elevation (NSTEMI) myocardial infarction: Secondary | ICD-10-CM | POA: Insufficient documentation

## 2022-10-05 NOTE — Patient Instructions (Addendum)
Patient Instructions  Patient Details  Name: Aaron Bowers MRN: [id number] Date of Birth: 23-Apr-1970 Referring Provider:  [name], MD  Below are your personal goals for exercise, nutrition, and risk factors. Our goal is to help you stay on track towards obtaining and maintaining these goals. We will be discussing your progress on these goals with you throughout the program.  Initial Exercise Prescription:  Initial Exercise Prescription - 10/05/22 1100       Date of Initial Exercise RX and Referring Provider   Date 10/05/22    Referring Provider End      Oxygen   Maintain Oxygen Saturation 88% or higher      Treadmill   MPH 2.2    Grade 1    Minutes 15    METs 2.99      NuStep   Level 3    SPM 80    Minutes 15    METs 3.92      REL-XR   Level 3    Speed 50    Minutes 15    METs 3.92      Prescription Details   Frequency (times per week) 3    Duration Progress to 30 minutes of continuous aerobic without signs/symptoms of physical distress      Intensity   THRR 40-80% of Max Heartrate 104-146    Ratings of Perceived Exertion 11-13    Perceived Dyspnea 0-4      Progression   Progression Continue to progress workloads to maintain intensity without signs/symptoms of physical distress.      Resistance Training   Training Prescription Yes    Weight 5 lb    Reps 10-15             Exercise Goals: Frequency: Be able to perform aerobic exercise two to three times per week in program working toward 2-5 days per week of home exercise.  Intensity: Work with a perceived exertion of 11 (fairly light) - 15 (hard) while following your exercise prescription.  We will make changes to your prescription with you as you progress through the program.   Duration: Be able to do 30 to 45 minutes of continuous aerobic exercise in addition to a 5 minute warm-up and a 5 minute cool-down routine.   Nutrition Goals: Your personal nutrition goals will be established  when you do your nutrition analysis with the dietician.  The following are general nutrition guidelines to follow: Cholesterol < 200mg /day Sodium < 1500mg /day Fiber: Men over 50 yrs - 30 grams per day  Personal Goals:  Personal Goals and Risk Factors at Admission - 09/27/22 1316       Core Components/Risk Factors/Patient Goals on Admission   Diabetes Yes    Intervention Provide education about signs/symptoms and action to take for hypo/hyperglycemia.;Provide education about proper nutrition, including hydration, and aerobic/resistive exercise prescription along with prescribed medications to achieve blood glucose in normal ranges: Fasting glucose 65-99 mg/dL    Expected Outcomes Short Term: Participant verbalizes understanding of the signs/symptoms and immediate care of hyper/hypoglycemia, proper foot care and importance of medication, aerobic/resistive exercise and nutrition plan for blood glucose control.;Long Term: Attainment of HbA1C < 7%.    Lipids Yes    Intervention Provide education and support for participant on nutrition & aerobic/resistive exercise along with prescribed medications to achieve LDL 70mg , HDL >40mg .    Expected Outcomes Short Term: Participant states understanding of desired cholesterol values and is compliant with medications prescribed. Participant is following exercise prescription  and nutrition guidelines.;Long Term: Cholesterol controlled with medications as prescribed, with individualized exercise RX and with personalized nutrition plan. Value goals: LDL < 70mg , HDL > 40 mg.            Exercise Goals and Review:  Exercise Goals     Row Name 10/05/22 1134             Exercise Goals   Increase Physical Activity Yes       Intervention Provide advice, education, support and counseling about physical activity/exercise needs.;Develop an individualized exercise prescription for aerobic and resistive training based on initial evaluation findings, risk  stratification, comorbidities and participant's personal goals.       Expected Outcomes Short Term: Attend rehab on a regular basis to increase amount of physical activity.;Long Term: Add in home exercise to make exercise part of routine and to increase amount of physical activity.;Long Term: Exercising regularly at least 3-5 days a week.       Increase Strength and Stamina Yes       Intervention Provide advice, education, support and counseling about physical activity/exercise needs.;Develop an individualized exercise prescription for aerobic and resistive training based on initial evaluation findings, risk stratification, comorbidities and participant's personal goals.       Expected Outcomes Short Term: Increase workloads from initial exercise prescription for resistance, speed, and METs.;Short Term: Perform resistance training exercises routinely during rehab and add in resistance training at home;Long Term: Improve cardiorespiratory fitness, muscular endurance and strength as measured by increased METs and functional capacity ( )       Able to understand and use rate of perceived exertion (RPE) scale Yes       Intervention Provide education and explanation on how to use RPE scale       Expected Outcomes Short Term: Able to use RPE daily in rehab to express subjective intensity level;Long Term:  Able to use RPE to guide intensity level when exercising independently       Able to understand and use Dyspnea scale Yes       Intervention Provide education and explanation on how to use Dyspnea scale       Expected Outcomes Short Term: Able to use Dyspnea scale daily in rehab to express subjective sense of shortness of breath during exertion;Long Term: Able to use Dyspnea scale to guide intensity level when exercising independently       Knowledge and understanding of Target Heart Rate Range (THRR) Yes       Intervention Provide education and explanation of THRR including how the numbers were predicted  and where they are located for reference       Expected Outcomes Short Term: Able to state/look up THRR;Long Term: Able to use THRR to govern intensity when exercising independently;Short Term: Able to use daily as guideline for intensity in rehab       Able to check pulse independently Yes       Intervention Provide education and demonstration on how to check pulse in carotid and radial arteries.;Review the importance of being able to check your own pulse for safety during independent exercise       Expected Outcomes Short Term: Able to explain why pulse checking is important during independent exercise;Long Term: Able to check pulse independently and accurately       Understanding of Exercise Prescription Yes       Intervention Provide education, explanation, and written materials on patient's individual exercise prescription       Expected Outcomes  Short Term: Able to explain program exercise prescription;Long Term: Able to explain home exercise prescription to exercise independently

## 2022-10-05 NOTE — Progress Notes (Signed)
Assessment start time: 8:59 AM  Digestive issues/concerns: no known food allergies   24-hours Recall: B: nothing Snack: 10:30 4 donuts (137BS) L: grilled cheese sandwich- mayo, butter, small fries, half and half sweet tea,  Snack: baked chips, Mt dew zero sugar  D: raisin bran,   Beverages flavored water (32oz), sweet tea and soda (has been trying to cut out sugary beverages since DM) Alcohol none  Supplements none Intake Patterns He reports his eating habits are inconsistent  Pt eats away from home 7-10 times a week.   Education r/t nutrition plan Patient drinking 32oz of water daily. He was drinking a lot of sugary beverages, but has cut back on them since finding out about his DM dx. He works busy hours and often eats out. His food choices are not great, often carb heavy and full of sodium. Brainstormed some changes he could make to improve his meals. Recommended keeping carbs choices to one at meals, watching portions and not drinking sugary beverages. Reviewed mediterranean diet handout, educated on types of fats, sources, and how to read labels. Built out several meals and snacks with foods he likes and will eat, focusing on reducing saturated fat, sodium and controlling carbs per meal with goal of ~30-60g.   Goal 1: Watch carb portions and aim for ~30-60g per meal Goal 2: Drink 48oz of water per day Goal 3: Shop for good snack ideas to help be consistent with eating during busy work hours  End time 10:16 AM

## 2022-10-05 NOTE — Progress Notes (Signed)
Cardiac Individual Treatment Plan  Patient Details  Name: Aaron Bowers MRN: [id number] Date of Birth: 03/27/1970 Referring Provider:   Flowsheet Row Cardiac Rehab from 10/05/2022 in [facility] Cardiac and Pulmonary Rehab  Referring Provider End       Initial Encounter Date:  Flowsheet Row Cardiac Rehab from 10/05/2022 in [facility] Cardiac and Pulmonary Rehab  Date 10/05/22       Visit Diagnosis: NSTEMI (non-ST elevated myocardial infarction) [facility])  Status post coronary artery stent placement  Patient's Home Medications on Admission:  Current Outpatient Medications:    aspirin 81 MG chewable tablet, Chew 1 tablet (81 mg total) by mouth daily., Disp: 30 tablet, Rfl: 3   atorvastatin (LIPITOR) 80 MG tablet, Take 1 tablet (80 mg total) by mouth daily., Disp: 30 tablet, Rfl: 3   divalproex (DEPAKOTE ER) 500 MG 24 hr tablet, Take 500 mg by mouth 3 (three) times daily., Disp: , Rfl:    ezetimibe (ZETIA) 10 MG tablet, Take 1 tablet (10 mg total) by mouth daily., Disp: 30 tablet, Rfl: 3   furosemide (LASIX) 20 MG tablet, Take 1 tablet (20 mg total) by mouth every other day., Disp: 90 tablet, Rfl: 3   lamoTRIgine (LAMICTAL) 150 MG tablet, Take 150 mg by mouth 2 (two) times daily., Disp: , Rfl:    losartan (COZAAR) 25 MG tablet, Take 0.5 tablets (12.5 mg total) by mouth daily., Disp: 90 tablet, Rfl: 3   metFORMIN (GLUCOPHAGE-XR) 500 MG 24 hr tablet, Take 1 tablet (500 mg total) by mouth 2 (two) times daily with a meal., Disp: 60 tablet, Rfl: 3   metoprolol succinate (TOPROL-XL) 25 MG 24 hr tablet, Take 0.5 tablets (12.5 mg total) by mouth at bedtime., Disp: 15 tablet, Rfl: 3   potassium chloride (KLOR-CON M) 10 MEQ tablet, Take 1 tablet (10 mEq total) by mouth every other day. Take only when taking Lasix (furosemide)., Disp: 90 tablet, Rfl: 3   prasugrel (EFFIENT) 10 MG TABS tablet, Take 1 tablet (10 mg total) by mouth daily., Disp: 30 tablet, Rfl: 3  Past Medical History: Past Medical History:   Diagnosis Date   Bipolar disorder (HCC)    Borderline hyperlipidemia    CAD (coronary artery disease)    Diabetes mellitus (HCC)    Hyperlipemia     Tobacco Use: Social History   Tobacco Use  Smoking Status Never  Smokeless Tobacco Never    Labs: Review Flowsheet       Latest Ref Rng & Units 09/14/2022  Labs for ITP Cardiac and Pulmonary Rehab  Cholestrol 0 - 200 mg/dL 010   LDL (calc) 0 - 99 mg/dL 272   HDL-C >53 mg/dL 31   Trlycerides <664 mg/dL 403   Hemoglobin K7Q 4.8 - 5.6 % 7.1     Details             Exercise Target Goals: Exercise Program Goal: Individual exercise prescription set using results from initial 6 min walk test and THRR while considering  patient's activity barriers and safety.   Exercise Prescription Goal: Initial exercise prescription builds to 30-45 minutes a day of aerobic activity, 2-3 days per week.  Home exercise guidelines will be given to patient during program as part of exercise prescription that the participant will acknowledge.   Education: Aerobic Exercise: - Group verbal and visual presentation on the components of exercise prescription. Introduces F.I.T.T principle from ACSM for exercise prescriptions.  Reviews F.I.T.T. principles of aerobic exercise including progression. Written material given at graduation. Flowsheet  Row Cardiac Rehab from 10/05/2022 in [facility] Cardiac and Pulmonary Rehab  Education need identified 10/05/22       Education: Resistance Exercise: - Group verbal and visual presentation on the components of exercise prescription. Introduces F.I.T.T principle from ACSM for exercise prescriptions  Reviews F.I.T.T. principles of resistance exercise including progression. Written material given at graduation.    Education: Exercise & Equipment Safety: - Individual verbal instruction and demonstration of equipment use and safety with use of the equipment. Flowsheet Row Cardiac Rehab from 10/05/2022 in [facility] Cardiac  and Pulmonary Rehab  Date 10/05/22  Educator NT  Instruction Review Code 1- Verbalizes Understanding       Education: Exercise Physiology & General Exercise Guidelines: - Group verbal and written instruction with models to review the exercise physiology of the cardiovascular system and associated critical values. Provides general exercise guidelines with specific guidelines to those with heart or lung disease.    Education: Flexibility, Balance, Mind/Body Relaxation: - Group verbal and visual presentation with interactive activity on the components of exercise prescription. Introduces F.I.T.T principle from ACSM for exercise prescriptions. Reviews F.I.T.T. principles of flexibility and balance exercise training including progression. Also discusses the mind body connection.  Reviews various relaxation techniques to help reduce and manage stress (i.e. Deep breathing, progressive muscle relaxation, and visualization). Balance handout provided to take home. Written material given at graduation.   Activity Barriers & Risk Stratification:  Activity Barriers & Cardiac Risk Stratification - 10/05/22 1134       Activity Barriers & Cardiac Risk Stratification   Activity Barriers Neck/Spine Problems   bulging disc   Cardiac Risk Stratification Moderate             6 Minute Walk:  6 Minute Walk     Row Name 10/05/22 1132         6 Minute Walk   Phase Initial     Distance 1340 feet     Walk Time 6 minutes     # of Rest Breaks 0     MPH 2.54     METS 3.92     RPE 7     Perceived Dyspnea  0     VO2 Peak 13.71     Symptoms No     Resting HR 62 bpm     Resting BP 118/68     Resting Oxygen Saturation  98 %     Exercise Oxygen Saturation  during 6 min walk 99 %     Max Ex. HR 94 bpm     Max Ex. BP 136/72     2 Minute Post BP 128/68              Oxygen Initial Assessment:   Oxygen Re-Evaluation:   Oxygen Discharge (Final Oxygen Re-Evaluation):   Initial Exercise  Prescription:  Initial Exercise Prescription - 10/05/22 1100       Date of Initial Exercise RX and Referring Provider   Date 10/05/22    Referring Provider End      Oxygen   Maintain Oxygen Saturation 88% or higher      Treadmill   MPH 2.2    Grade 1    Minutes 15    METs 2.99      NuStep   Level 3    SPM 80    Minutes 15    METs 3.92      REL-XR   Level 3    Speed 50    Minutes 15  METs 3.92      Prescription Details   Frequency (times per week) 3    Duration Progress to 30 minutes of continuous aerobic without signs/symptoms of physical distress      Intensity   THRR 40-80% of Max Heartrate 104-146    Ratings of Perceived Exertion 11-13    Perceived Dyspnea 0-4      Progression   Progression Continue to progress workloads to maintain intensity without signs/symptoms of physical distress.      Resistance Training   Training Prescription Yes    Weight 5 lb    Reps 10-15             Perform Capillary Blood Glucose checks as needed.  Exercise Prescription Changes:   Exercise Prescription Changes     Row Name 10/05/22 1100             Response to Exercise   Blood Pressure (Admit) 118/68       Blood Pressure (Exercise) 136/72       Blood Pressure (Exit) 128/68       Heart Rate (Admit) 62 bpm       Heart Rate (Exercise) 94 bpm       Heart Rate (Exit) 66 bpm       Oxygen Saturation (Admit) 98 %       Oxygen Saturation (Exercise) 99 %       Rating of Perceived Exertion (Exercise) 7       Perceived Dyspnea (Exercise) 0       Symptoms None       Comments Results                Exercise Comments:   Exercise Goals and Review:   Exercise Goals     Row Name 10/05/22 1134             Exercise Goals   Increase Physical Activity Yes       Intervention Provide advice, education, support and counseling about physical activity/exercise needs.;Develop an individualized exercise prescription for aerobic and resistive training based  on initial evaluation findings, risk stratification, comorbidities and participant's personal goals.       Expected Outcomes Short Term: Attend rehab on a regular basis to increase amount of physical activity.;Long Term: Add in home exercise to make exercise part of routine and to increase amount of physical activity.;Long Term: Exercising regularly at least 3-5 days a week.       Increase Strength and Stamina Yes       Intervention Provide advice, education, support and counseling about physical activity/exercise needs.;Develop an individualized exercise prescription for aerobic and resistive training based on initial evaluation findings, risk stratification, comorbidities and participant's personal goals.       Expected Outcomes Short Term: Increase workloads from initial exercise prescription for resistance, speed, and METs.;Short Term: Perform resistance training exercises routinely during rehab and add in resistance training at home;Long Term: Improve cardiorespiratory fitness, muscular endurance and strength as measured by increased METs and functional capacity ( )       Able to understand and use rate of perceived exertion (RPE) scale Yes       Intervention Provide education and explanation on how to use RPE scale       Expected Outcomes Short Term: Able to use RPE daily in rehab to express subjective intensity level;Long Term:  Able to use RPE to guide intensity level when exercising independently       Able to understand and  use Dyspnea scale Yes       Intervention Provide education and explanation on how to use Dyspnea scale       Expected Outcomes Short Term: Able to use Dyspnea scale daily in rehab to express subjective sense of shortness of breath during exertion;Long Term: Able to use Dyspnea scale to guide intensity level when exercising independently       Knowledge and understanding of Target Heart Rate Range (THRR) Yes       Intervention Provide education and explanation of THRR  including how the numbers were predicted and where they are located for reference       Expected Outcomes Short Term: Able to state/look up THRR;Long Term: Able to use THRR to govern intensity when exercising independently;Short Term: Able to use daily as guideline for intensity in rehab       Able to check pulse independently Yes       Intervention Provide education and demonstration on how to check pulse in carotid and radial arteries.;Review the importance of being able to check your own pulse for safety during independent exercise       Expected Outcomes Short Term: Able to explain why pulse checking is important during independent exercise;Long Term: Able to check pulse independently and accurately       Understanding of Exercise Prescription Yes       Intervention Provide education, explanation, and written materials on patient's individual exercise prescription       Expected Outcomes Short Term: Able to explain program exercise prescription;Long Term: Able to explain home exercise prescription to exercise independently                Exercise Goals Re-Evaluation :   Discharge Exercise Prescription (Final Exercise Prescription Changes):  Exercise Prescription Changes - 10/05/22 1100       Response to Exercise   Blood Pressure (Admit) 118/68    Blood Pressure (Exercise) 136/72    Blood Pressure (Exit) 128/68    Heart Rate (Admit) 62 bpm    Heart Rate (Exercise) 94 bpm    Heart Rate (Exit) 66 bpm    Oxygen Saturation (Admit) 98 %    Oxygen Saturation (Exercise) 99 %    Rating of Perceived Exertion (Exercise) 7    Perceived Dyspnea (Exercise) 0    Symptoms None    Comments Results             Nutrition:  Target Goals: Understanding of nutrition guidelines, daily intake of sodium 1500mg , cholesterol 200mg , calories 30% from fat and 7% or less from saturated fats, daily to have 5 or more servings of fruits and vegetables.  Education: All About Nutrition: -Group  instruction provided by verbal, written material, interactive activities, discussions, models, and posters to present general guidelines for heart healthy nutrition including fat, fiber, MyPlate, the role of sodium in heart healthy nutrition, utilization of the nutrition label, and utilization of this knowledge for meal planning. Follow up email sent as well. Written material given at graduation. Flowsheet Row Cardiac Rehab from 10/05/2022 in [facility] Cardiac and Pulmonary Rehab  Education need identified 10/05/22       Biometrics:  Pre Biometrics - 10/05/22 1135       Pre Biometrics   Height 5\' 9"  (1.753 m)    Weight 190 lb 14.4 oz (86.6 kg)    Waist Circumference 36 inches    Hip Circumference 40.5 inches    Waist to Hip Ratio 0.89 %    BMI (Calculated)  28.18    Single Leg Stand 30 seconds              Nutrition Therapy Plan and Nutrition Goals:   Nutrition Assessments:  MEDIFICTS Score Key: >=70 Need to make dietary changes  40-70 Heart Healthy Diet <= 40 Therapeutic Level Cholesterol Diet  Flowsheet Row Cardiac Rehab from 10/05/2022 in [facility] Cardiac and Pulmonary Rehab  Picture Your Plate Total Score on Admission 39      Picture Your Plate Scores: <16 Unhealthy dietary pattern with much room for improvement. 41-50 Dietary pattern unlikely to meet recommendations for good health and room for improvement. 51-60 More healthful dietary pattern, with some room for improvement.  >60 Healthy dietary pattern, although there may be some specific behaviors that could be improved.    Nutrition Goals Re-Evaluation:   Nutrition Goals Discharge (Final Nutrition Goals Re-Evaluation):   Psychosocial: Target Goals: Acknowledge presence or absence of significant depression and/or stress, maximize coping skills, provide positive support system. Participant is able to verbalize types and ability to use techniques and skills needed for reducing stress and depression.   Education:  Stress, Anxiety, and Depression - Group verbal and visual presentation to define topics covered.  Reviews how body is impacted by stress, anxiety, and depression.  Also discusses healthy ways to reduce stress and to treat/manage anxiety and depression.  Written material given at graduation.   Education: Sleep Hygiene -Provides group verbal and written instruction about how sleep can affect your health.  Define sleep hygiene, discuss sleep cycles and impact of sleep habits. Review good sleep hygiene tips.    Initial Review & Psychosocial Screening:  Initial Psych Review & Screening - 09/27/22 1325       Initial Review   Current issues with Current Sleep Concerns;Current Stress Concerns;Current Anxiety/Panic    Source of Stress Concerns Financial      Family Dynamics   Good Support System? Yes      Barriers   Psychosocial barriers to participate in program There are no identifiable barriers or psychosocial needs.;The patient should benefit from training in stress management and relaxation.      Screening Interventions   Interventions Encouraged to exercise;Provide feedback about the scores to participant;To provide support and resources with identified psychosocial needs    Expected Outcomes Long Term Goal: Stressors or current issues are controlled or eliminated.;Short Term goal: Utilizing psychosocial counselor, staff and physician to assist with identification of specific Stressors or current issues interfering with healing process. Setting desired goal for each stressor or current issue identified.;Short Term goal: Identification and review with participant of any Quality of Life or Depression concerns found by scoring the questionnaire.;Long Term goal: The participant improves quality of Life and PHQ9 Scores as seen by post scores and/or verbalization of changes             Quality of Life Scores:   Quality of Life - 10/05/22 1111       Quality of Life   Select Quality of Life       Quality of Life Scores   Health/Function Pre 16.3 %    Socioeconomic Pre 20 %    Psych/Spiritual Pre 15.43 %    Family Pre 13.7 %    GLOBAL Pre 16.6 %            Scores of 19 and below usually indicate a poorer quality of life in these areas.  A difference of  2-3 points is a clinically meaningful difference.  A difference  of 2-3 points in the total score of the Quality of Life Index has been associated with significant improvement in overall quality of life, self-image, physical symptoms, and general health in studies assessing change in quality of life.  PHQ-9: Review Flowsheet       10/05/2022 09/22/2022  Depression screen PHQ 2/9  Decreased Interest 0 0  Down, Depressed, Hopeless 2 0  PHQ - 2 Score 2 0  Altered sleeping 2 -  Tired, decreased energy 2 -  Change in appetite 3 -  Feeling bad or failure about yourself  0 -  Trouble concentrating 0 -  Moving slowly or fidgety/restless 3 -  Suicidal thoughts 0 -  PHQ-9 Score 12 -  Difficult doing work/chores Somewhat difficult -    Details           Interpretation of Total Score  Total Score Depression Severity:  1-4 = Minimal depression, 5-9 = Mild depression, 10-14 = Moderate depression, 15-19 = Moderately severe depression, 20-27 = Severe depression   Psychosocial Evaluation and Intervention:  Psychosocial Evaluation - 09/27/22 1335       Psychosocial Evaluation & Interventions   Interventions Encouraged to exercise with the program and follow exercise prescription;Stress management education;Relaxation education    Comments Semisi is 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 He was also newly diagnosed with diabetes. He admits it is taking time to get used to all the new medication and lifestyle changes he is having to make. He has met with a dietitian and is very interested in meeting with the program's dietitian to continue learning what he should be eating. He plans on returning to work in November,  as it is physically demanding so he had to wait until then. He lives by himself and states he has a support system, but usually he is the one helping others and it is hard for him to reach out when he needs something. He has had issues with sleep for a long time, and recently noticed it has changed patterns. He is thinking this might be related to all the new medications. He has been shopping around for a blood pressure cuff and has found a glucometer he likes. He states he is very motivated to get started in the program because he wants to take care of his health.    Expected Outcomes Short: attend cardiac rehab for education and exercise. Long: develop and maintain positive self care habits    Continue Psychosocial Services  Follow up required by staff             Psychosocial Re-Evaluation:   Psychosocial Discharge (Final Psychosocial Re-Evaluation):   Vocational Rehabilitation: Provide vocational rehab assistance to qualifying candidates.   Vocational Rehab Evaluation & Intervention:  Vocational Rehab - 09/27/22 1325       Initial Vocational Rehab Evaluation & Intervention   Assessment shows need for Vocational Rehabilitation No             Education: Education Goals: Education classes will be provided on a variety of topics geared toward better understanding of heart health and risk factor modification. Participant will state understanding/return demonstration of topics presented as noted by education test scores.  Learning Barriers/Preferences:  Learning Barriers/Preferences - 09/27/22 1324       Learning Barriers/Preferences   Learning Barriers None    Learning Preferences None             General Cardiac Education Topics:  AED/CPR: - Group verbal and written  instruction with the use of models to demonstrate the basic use of the AED with the basic ABC's of resuscitation.   Anatomy and Cardiac Procedures: - Group verbal and visual presentation and models  provide information about basic cardiac anatomy and function. Reviews the testing methods done to diagnose heart disease and the outcomes of the test results. Describes the treatment choices: Medical Management, Angioplasty, or Coronary Bypass Surgery for treating various heart conditions including Myocardial Infarction, Angina, Valve Disease, and Cardiac Arrhythmias.  Written material given at graduation.   Medication Safety: - Group verbal and visual instruction to review commonly prescribed medications for heart and lung disease. Reviews the medication, class of the drug, and side effects. Includes the steps to properly store meds and maintain the prescription regimen.  Written material given at graduation.   Intimacy: - Group verbal instruction through game format to discuss how heart and lung disease can affect sexual intimacy. Written material given at graduation..   Know Your Numbers and Heart Failure: - Group verbal and visual instruction to discuss disease risk factors for cardiac and pulmonary disease and treatment options.  Reviews associated critical values for Overweight/Obesity, Hypertension, Cholesterol, and Diabetes.  Discusses basics of heart failure: signs/symptoms and treatments.  Introduces Heart Failure Zone chart for action plan for heart failure.  Written material given at graduation. Flowsheet Row Cardiac Rehab from 10/05/2022 in [facility] Cardiac and Pulmonary Rehab  Education need identified 10/05/22       Infection Prevention: - Provides verbal and written material to individual with discussion of infection control including proper hand washing and proper equipment cleaning during exercise session. Flowsheet Row Cardiac Rehab from 10/05/2022 in [facility] Cardiac and Pulmonary Rehab  Date 10/05/22  Educator NT  Instruction Review Code 1- Verbalizes Understanding       Falls Prevention: - Provides verbal and written material to individual with discussion of falls prevention  and safety. Flowsheet Row Cardiac Rehab from 10/05/2022 in [facility] Cardiac and Pulmonary Rehab  Date 10/05/22  Educator NT  Instruction Review Code 1- Verbalizes Understanding       Other: -Provides group and verbal instruction on various topics (see comments)   Knowledge Questionnaire Score:  Knowledge Questionnaire Score - 10/05/22 1111       Knowledge Questionnaire Score   Pre Score 21/26             Core Components/Risk Factors/Patient Goals at Admission:  Personal Goals and Risk Factors at Admission - 09/27/22 1316       Core Components/Risk Factors/Patient Goals on Admission   Diabetes Yes    Intervention Provide education about signs/symptoms and action to take for hypo/hyperglycemia.;Provide education about proper nutrition, including hydration, and aerobic/resistive exercise prescription along with prescribed medications to achieve blood glucose in normal ranges: Fasting glucose 65-99 mg/dL    Expected Outcomes Short Term: Participant verbalizes understanding of the signs/symptoms and immediate care of hyper/hypoglycemia, proper foot care and importance of medication, aerobic/resistive exercise and nutrition plan for blood glucose control.;Long Term: Attainment of HbA1C < 7%.    Lipids Yes    Intervention Provide education and support for participant on nutrition & aerobic/resistive exercise along with prescribed medications to achieve LDL 70mg , HDL >40mg .    Expected Outcomes Short Term: Participant states understanding of desired cholesterol values and is compliant with medications prescribed. Participant is following exercise prescription and nutrition guidelines.;Long Term: Cholesterol controlled with medications as prescribed, with individualized exercise RX and with personalized nutrition plan. Value goals: LDL < 70mg , HDL >  40 mg.             Education:Diabetes - Individual verbal and written instruction to review signs/symptoms of diabetes, desired ranges of  glucose level fasting, after meals and with exercise. Acknowledge that pre and post exercise glucose checks will be done for 3 sessions at entry of program. Flowsheet Row Cardiac Rehab from 10/05/2022 in [facility] Cardiac and Pulmonary Rehab  Date 09/27/22  Educator [facility]  Instruction Review Code 1- Verbalizes Understanding       Core Components/Risk Factors/Patient Goals Review:    Core Components/Risk Factors/Patient Goals at Discharge (Final Review):    ITP Comments:  ITP Comments     Row Name 09/27/22 1311 10/05/22 1105         ITP Comments Initial phone call completed. Diagnosis can be found in [facility] 9/10. EP Orientation scheduled for Wednesday 10/2 8am. Completed and gym orientation. Initial ITP created and sent for review to Dr. Bethann Punches, Medical Director.               Comments: Initial ITP

## 2022-10-07 ENCOUNTER — Encounter: Payer: 59 | Admitting: *Deleted

## 2022-10-07 DIAGNOSIS — Z955 Presence of coronary angioplasty implant and graft: Secondary | ICD-10-CM

## 2022-10-07 DIAGNOSIS — I214 Non-ST elevation (NSTEMI) myocardial infarction: Secondary | ICD-10-CM | POA: Diagnosis not present

## 2022-10-07 LAB — GLUCOSE, CAPILLARY
Glucose-Capillary: 149 mg/dL — ABNORMAL HIGH (ref 70–99)
Glucose-Capillary: 146 mg/dL — ABNORMAL HIGH (ref 70–99)

## 2022-10-07 NOTE — Progress Notes (Signed)
Daily Session Note  Patient Details  Name: Aaron Bowers MRN: [id number] Date of Birth: 1970-05-04 Referring Provider:   Flowsheet Row Cardiac Rehab from 10/05/2022 in [facility] Cardiac and Pulmonary Rehab  Referring Provider Dr. [name], MD       Encounter Date: 10/07/2022  Check In:  Session Check In - 10/07/22 0827       Check-In   Supervising physician immediately available to respond to emergencies See telemetry face sheet for immediately available ER MD    Location [facility]-Cardiac & Pulmonary Rehab    Staff Present Elige Ko, RCP,RRT,BSRT;Noah Tickle, BS, Exercise Physiologist;Undray Allman, RN, BSN, CCRP    Virtual Visit No    Medication changes reported     No    Fall or balance concerns reported    No    Warm-up and Cool-down Performed on first and last piece of equipment    Resistance Training Performed Yes    VAD Patient? No    PAD/SET Patient? No      Pain Assessment   Currently in Pain? No/denies                Social History   Tobacco Use  Smoking Status Never  Smokeless Tobacco Never    Goals Met:  Exercise tolerated well Personal goals reviewed No report of concerns or symptoms today  Goals Unmet:  Not Applicable  Comments: First full day of exercise!  Patient was oriented to gym and equipment including functions, settings, policies, and procedures.  Patient's individual exercise prescription and treatment plan were reviewed.  All starting workloads were established based on the results of the 6 minute walk test done at initial orientation visit.  The plan for exercise progression was also introduced and progression will be customized based on patient's performance and goals.    Dr. Bethann Punches is Medical Director for [facility] Cardiac Rehabilitation.  Dr. Vida Rigger is Medical Director for [facility] Pulmonary Rehabilitation.

## 2022-10-10 ENCOUNTER — Encounter: Payer: 59 | Admitting: *Deleted

## 2022-10-10 ENCOUNTER — Other Ambulatory Visit: Payer: Self-pay

## 2022-10-10 DIAGNOSIS — E875 Hyperkalemia: Secondary | ICD-10-CM

## 2022-10-10 DIAGNOSIS — I214 Non-ST elevation (NSTEMI) myocardial infarction: Secondary | ICD-10-CM | POA: Diagnosis not present

## 2022-10-10 DIAGNOSIS — Z955 Presence of coronary angioplasty implant and graft: Secondary | ICD-10-CM

## 2022-10-10 DIAGNOSIS — Z79899 Other long term (current) drug therapy: Secondary | ICD-10-CM

## 2022-10-10 LAB — GLUCOSE, CAPILLARY
Glucose-Capillary: 169 mg/dL — ABNORMAL HIGH (ref 70–99)
Glucose-Capillary: 157 mg/dL — ABNORMAL HIGH (ref 70–99)

## 2022-10-10 NOTE — Progress Notes (Signed)
Daily Session Note  Patient Details  Name: Aaron Bowers MRN: [id number] Date of Birth: Mar 07, 1970 Referring Provider:   Flowsheet Row Cardiac Rehab from 10/05/2022 in [facility] Cardiac and Pulmonary Rehab  Referring Provider Dr. [name], MD       Encounter Date: 10/10/2022  Check In:  Session Check In - 10/10/22 0829       Check-In   Supervising physician immediately available to respond to emergencies See telemetry face sheet for immediately available ER MD    Location [facility]-Cardiac & Pulmonary Rehab    Staff Present Lanny Hurst, RN, Franki Monte, BS, ACSM CEP, Exercise Physiologist;Joseph Reino Kent, RCP,RRT,BSRT;Noah Tickle, BS, Exercise Physiologist    Virtual Visit No    Medication changes reported     No    Fall or balance concerns reported    No    Warm-up and Cool-down Performed on first and last piece of equipment    Resistance Training Performed Yes    VAD Patient? No    PAD/SET Patient? No      Pain Assessment   Currently in Pain? No/denies                Social History   Tobacco Use  Smoking Status Never  Smokeless Tobacco Never    Goals Met:  Independence with exercise equipment Exercise tolerated well No report of concerns or symptoms today Strength training completed today  Goals Unmet:  Not Applicable  Comments: Pt able to follow exercise prescription today without complaint.  Will continue to monitor for progression.    Dr. Bethann Punches is Medical Director for [facility] Cardiac Rehabilitation.  Dr. Vida Rigger is Medical Director for [facility] Pulmonary Rehabilitation.

## 2022-10-11 LAB — BASIC METABOLIC PANEL
BUN/Creatinine Ratio: 9 (ref 9–20)
BUN: 8 mg/dL (ref 6–24)
CO2: 22 mmol/L (ref 20–29)
Calcium: 9.5 mg/dL (ref 8.7–10.2)
Chloride: 101 mmol/L (ref 96–106)
Creatinine, Ser: 0.87 mg/dL (ref 0.76–1.27)
Glucose: 146 mg/dL — ABNORMAL HIGH (ref 70–99)
Potassium: 4.5 mmol/L (ref 3.5–5.2)
Sodium: 140 mmol/L (ref 134–144)
eGFR: 104 mL/min/{1.73_m2} (ref >59)

## 2022-10-12 ENCOUNTER — Encounter: Payer: 59 | Admitting: *Deleted

## 2022-10-12 DIAGNOSIS — Z955 Presence of coronary angioplasty implant and graft: Secondary | ICD-10-CM

## 2022-10-12 DIAGNOSIS — I214 Non-ST elevation (NSTEMI) myocardial infarction: Secondary | ICD-10-CM | POA: Diagnosis not present

## 2022-10-12 LAB — GLUCOSE, CAPILLARY
Glucose-Capillary: 146 mg/dL — ABNORMAL HIGH (ref 70–99)
Glucose-Capillary: 133 mg/dL — ABNORMAL HIGH (ref 70–99)

## 2022-10-12 NOTE — Progress Notes (Signed)
Daily Session Note  Patient Details  Name: Aaron Bowers MRN: [id number] Date of Birth: 12-10-1970 Referring Provider:   Flowsheet Row Cardiac Rehab from 10/05/2022 in [facility] Cardiac and Pulmonary Rehab  Referring Provider Dr. [name], MD       Encounter Date: 10/12/2022  Check In:  Session Check In - 10/12/22 0807       Check-In   Supervising physician immediately available to respond to emergencies See telemetry face sheet for immediately available ER MD    Location [facility]-Cardiac & Pulmonary Rehab    Staff Present Rory Percy, MS, Exercise Physiologist;Joseph Reino Kent, RCP,RRT,BSRT;Maxon [place] BS, , Exercise Physiologist;Cyerra Yim Katrinka Blazing, RN, ADN    Virtual Visit No    Medication changes reported     No    Fall or balance concerns reported    No    Warm-up and Cool-down Performed on first and last piece of equipment    Resistance Training Performed Yes    VAD Patient? No    PAD/SET Patient? No      Pain Assessment   Currently in Pain? No/denies                Social History   Tobacco Use  Smoking Status Never  Smokeless Tobacco Never    Goals Met:  Independence with exercise equipment Exercise tolerated well No report of concerns or symptoms today Strength training completed today  Goals Unmet:  Not Applicable  Comments: Pt able to follow exercise prescription today without complaint.  Will continue to monitor for progression.    Dr. Bethann Punches is Medical Director for [facility] Cardiac Rehabilitation.  Dr. Vida Rigger is Medical Director for [facility] Pulmonary Rehabilitation.

## 2022-10-14 ENCOUNTER — Encounter: Payer: 59 | Admitting: *Deleted

## 2022-10-14 DIAGNOSIS — I214 Non-ST elevation (NSTEMI) myocardial infarction: Secondary | ICD-10-CM

## 2022-10-14 DIAGNOSIS — Z955 Presence of coronary angioplasty implant and graft: Secondary | ICD-10-CM

## 2022-10-14 NOTE — Progress Notes (Signed)
Daily Session Note  Patient Details  Name: Aaron Bowers MRN: [id number] Date of Birth: [birth year] Referring Provider:   Flowsheet Row Cardiac Rehab from 10/05/2022 in [facility] Cardiac and Pulmonary Rehab  Referring Provider Dr. [name], MD       Encounter Date: 10/14/2022  Check In:  Session Check In - 10/14/22 0800       Check-In   Supervising physician immediately available to respond to emergencies See telemetry face sheet for immediately available ER MD    Location [facility]-Cardiac & Pulmonary Rehab    Staff Present Bess Kinds RN, BSN;Margaret Best, MS, Exercise Physiologist;Maxon [place] BS, , Exercise Physiologist;Noah Tickle, BS, Exercise Physiologist    Virtual Visit No    Medication changes reported     No    Fall or balance concerns reported    No    Warm-up and Cool-down Performed on first and last piece of equipment    Resistance Training Performed Yes    VAD Patient? No    PAD/SET Patient? No      Pain Assessment   Currently in Pain? No/denies                Social History   Tobacco Use  Smoking Status Never  Smokeless Tobacco Never    Goals Met:  Independence with exercise equipment Exercise tolerated well No report of concerns or symptoms today Strength training completed today  Goals Unmet:  Not Applicable  Comments: Pt able to follow exercise prescription today without complaint.  Will continue to monitor for progression.    Dr. Bethann Punches is Medical Director for [facility] Cardiac Rehabilitation.  Dr. Vida Rigger is Medical Director for [facility] Pulmonary Rehabilitation.

## 2022-10-17 ENCOUNTER — Encounter: Payer: 59 | Admitting: *Deleted

## 2022-10-17 DIAGNOSIS — Z955 Presence of coronary angioplasty implant and graft: Secondary | ICD-10-CM

## 2022-10-17 DIAGNOSIS — I214 Non-ST elevation (NSTEMI) myocardial infarction: Secondary | ICD-10-CM | POA: Diagnosis not present

## 2022-10-17 NOTE — Progress Notes (Signed)
Daily Session Note  Patient Details  Name: Aaron Bowers MRN: [id number] Date of Birth: [birth year] Referring Provider:   Flowsheet Row Cardiac Rehab from 10/05/2022 in [facility] Cardiac and Pulmonary Rehab  Referring Provider Dr. [name], MD       Encounter Date: 10/17/2022  Check In:  Session Check In - 10/17/22 0823       Check-In   Supervising physician immediately available to respond to emergencies See telemetry face sheet for immediately available ER MD    Location [facility]-Cardiac & Pulmonary Rehab    Staff Present Balinda Quails, RDN, LDN;Joseph Reino Kent, RCP,RRT,BSRT;Kelly Madilyn Fireman, BS, ACSM CEP, Exercise Physiologist;Hasset Chaviano Katrinka Blazing, RN, ADN    Virtual Visit No    Medication changes reported     No    Fall or balance concerns reported    No    Warm-up and Cool-down Performed on first and last piece of equipment    Resistance Training Performed Yes    VAD Patient? No    PAD/SET Patient? No      Pain Assessment   Currently in Pain? No/denies                Social History   Tobacco Use  Smoking Status Never  Smokeless Tobacco Never    Goals Met:  Independence with exercise equipment Exercise tolerated well No report of concerns or symptoms today Strength training completed today  Goals Unmet:  Not Applicable  Comments: Pt able to follow exercise prescription today without complaint.  Will continue to monitor for progression.    Dr. Bethann Punches is Medical Director for [facility] Cardiac Rehabilitation.  Dr. Vida Rigger is Medical Director for [facility] Pulmonary Rehabilitation.

## 2022-10-19 ENCOUNTER — Encounter: Payer: 59 | Admitting: *Deleted

## 2022-10-19 DIAGNOSIS — I214 Non-ST elevation (NSTEMI) myocardial infarction: Secondary | ICD-10-CM | POA: Diagnosis not present

## 2022-10-19 DIAGNOSIS — Z955 Presence of coronary angioplasty implant and graft: Secondary | ICD-10-CM

## 2022-10-19 NOTE — Progress Notes (Signed)
Daily Session Note  Patient Details  Name: Aaron Bowers MRN: [id number] Date of Birth: November 03, 1970 Referring Provider:   Flowsheet Row Cardiac Rehab from 10/05/2022 in [facility] Cardiac and Pulmonary Rehab  Referring Provider Dr. [name], MD       Encounter Date: 10/19/2022  Check In:  Session Check In - 10/19/22 0742       Check-In   Supervising physician immediately available to respond to emergencies See telemetry face sheet for immediately available ER MD    Location [facility]-Cardiac & Pulmonary Rehab    Staff Present Rory Percy, MS, Exercise Physiologist;Joseph Reino Kent, RCP,RRT,BSRT;Maxon [place] BS, , Exercise Physiologist;Akshaya Toepfer Katrinka Blazing, RN, ADN    Virtual Visit No    Medication changes reported     No    Fall or balance concerns reported    No    Warm-up and Cool-down Performed on first and last piece of equipment    Resistance Training Performed Yes    VAD Patient? No    PAD/SET Patient? No      Pain Assessment   Currently in Pain? No/denies                Social History   Tobacco Use  Smoking Status Never  Smokeless Tobacco Never    Goals Met:  Independence with exercise equipment Exercise tolerated well No report of concerns or symptoms today Strength training completed today  Goals Unmet:  Not Applicable  Comments: Pt able to follow exercise prescription today without complaint.  Will continue to monitor for progression.    Dr. Bethann Punches is Medical Director for [facility] Cardiac Rehabilitation.  Dr. Vida Rigger is Medical Director for [facility] Pulmonary Rehabilitation.

## 2022-10-20 ENCOUNTER — Ambulatory Visit: Payer: 59 | Admitting: Nurse Practitioner

## 2022-10-20 ENCOUNTER — Other Ambulatory Visit: Payer: Self-pay

## 2022-10-20 ENCOUNTER — Encounter: Payer: Self-pay | Admitting: Nurse Practitioner

## 2022-10-20 VITALS — BP 126/82 | HR 84 | Temp 97.8°F | Resp 16 | Ht 68.0 in | Wt 194.3 lb

## 2022-10-20 DIAGNOSIS — E782 Mixed hyperlipidemia: Secondary | ICD-10-CM

## 2022-10-20 DIAGNOSIS — Z955 Presence of coronary angioplasty implant and graft: Secondary | ICD-10-CM

## 2022-10-20 DIAGNOSIS — I5020 Unspecified systolic (congestive) heart failure: Secondary | ICD-10-CM

## 2022-10-20 DIAGNOSIS — I5032 Chronic diastolic (congestive) heart failure: Secondary | ICD-10-CM | POA: Insufficient documentation

## 2022-10-20 DIAGNOSIS — Z7689 Persons encountering health services in other specified circumstances: Secondary | ICD-10-CM

## 2022-10-20 DIAGNOSIS — F319 Bipolar disorder, unspecified: Secondary | ICD-10-CM

## 2022-10-20 DIAGNOSIS — I255 Ischemic cardiomyopathy: Secondary | ICD-10-CM

## 2022-10-20 DIAGNOSIS — I252 Old myocardial infarction: Secondary | ICD-10-CM | POA: Insufficient documentation

## 2022-10-20 DIAGNOSIS — Z7984 Long term (current) use of oral hypoglycemic drugs: Secondary | ICD-10-CM

## 2022-10-20 DIAGNOSIS — I25110 Atherosclerotic heart disease of native coronary artery with unstable angina pectoris: Secondary | ICD-10-CM | POA: Diagnosis not present

## 2022-10-20 DIAGNOSIS — E119 Type 2 diabetes mellitus without complications: Secondary | ICD-10-CM

## 2022-10-20 MED ORDER — DIVALPROEX SODIUM ER 500 MG PO TB24
500.0000 mg | ORAL_TABLET | Freq: Three times a day (TID) | ORAL | 1 refills | Status: AC
Start: 2022-10-20 — End: ?

## 2022-10-20 NOTE — Progress Notes (Signed)
BP 126/82   Pulse 84   Temp 97.8 F (36.6 C) (Oral)   Resp 16   Ht 5\' 8"  (1.727 m)   Wt 194 lb 4.8 oz (88.1 kg)   SpO2 99%   BMI 29.54 kg/m    Subjective:    Patient ID: Aaron Bowers, male    DOB: [birth year], 52 y.o.   MRN: [id number]  HPI: Aaron Bowers is a 52 y.o. male  Chief Complaint  Patient presents with   Establish Care   Diabetes   Heart Problem    Heart attack 2 months ago   Manic Behavior    Bipolar disorder   Establish care: his last physical was a year ago.  Medical history includes hx of NSTEMI, HLD< DM, bipolar.  Family history includes heart disease, parkinsons disease, asthma,, dvt.  Health Maintenance up to date on labs,colon cancer screening .   Diabetes, Type 2: - new diagnosis -Last A1c 7.01 -Medications: metformin 500 mg BID -Patient is compliant with the above medications and reports no side effects.  -Checking BG at home: 130s-200s -Diet: reduce sugar and processed foods in your diet  -Exercise: recommend 150 min of physical activity weekly   -Eye exam: due -Foot exam: due -Microalbumin: due -Statin: yes -PNA vaccine: no -Denies symptoms of hypoglycemia, polyuria, polydipsia, numbness extremities, foot ulcers/trauma.    HLD:  -Medications: zetia 10 mg daily, atorvastatin 80 mg daily -Patient is compliant with above medications and reports no side effects.  -Last lipid panel:   Lipid Panel - lipoprotein a 96.2    Component Value Date/Time   CHOL 224 (H) 09/14/2022 0247   TRIG 264 (H) 09/14/2022 0247   HDL 31 (L) 09/14/2022 0247   CHOLHDL 7.2 09/14/2022 0247   VLDL 53 (H) 09/14/2022 0247   LDLCALC 140 (H) 09/14/2022 0247    Hx of NSTEMI/ cardiomyopathy/CAD/heart failure: diagnosed on 09/13/2022, establish with cardiology.  Last seen on 09/22/2022. He is also in cardiac rehab.   Notes from cardiology: Aaron Bowers is a 52 y.o. male with history of CAD with NSTEMI status post PCI/DES to the mid LAD with PTCA to a jailed D2  in 09/2022, HFrEF secondary to ICM, HLD, recently diagnosed diabetes, and bipolar disorder who presents for hospital follow-up as outlined below.   He was admitted to the hospital from 9/10 through 09/15/2022 after having progressive intermittent exertional chest discomfort with strenuous activities over the preceding several months and was found to have an NSTEMI. High-sensitivity troponin [place] at 17244.  LHC showed severe single-vessel CAD with 99% stenosis of the mid LAD involving the ostium of a moderate caliber D2 branch.  There was no significant disease noted in the dominant LCx or small RCA.  Moderately to severely reduced LV systolic function with an EF of 30 to 35% with normal filling pressure with an LVEDP of 10 mmHg.  He underwent successful PCI/DES to the mid LAD and angioplasty of jailed D2.  Final angiogram showed 0% residual stenosis in the LAD with 30% residual stenosis at the ostium of D2 with TIMI-3 flow throughout the left coronary artery.  Postintervention echo showed an EF of 35 to 40% with wall motion abnormality involving the mid to distal anteroseptal, anterior, periapical, and mid to distal lateral walls with the basal regions best preserved, normal RV systolic function, ventricular cavity size, and RVSP, moderate mitral regurgitation, and an estimated right atrial pressure of 3 mmHg.  Currently taking:asa 81 mg daily, atorvastatin 80 mg  daily, zetia 10 mg daily, lasix 20 mg every other day, losartan 25 mg dialy, metoprolol 12.5 mg daily, potassium 10 mEq every other day with lasix and effient 10 mg daily.   Bipolar:  patient currently taking depakote 500 mg three times a day and Lamictal 150 mg 2 times a day. Patient reports he is doing well on this treatment. Will continued.      10/20/2022   10:13 AM 10/20/2022   10:02 AM 10/19/2022    8:08 AM 10/05/2022   11:11 AM 09/22/2022    8:30 AM  Depression screen PHQ 2/9  Decreased Interest 0 0 0 0 0  Down, Depressed, Hopeless 0 0  1 2 0  PHQ - 2 Score 0 0 1 2 0  Altered sleeping 0  1 2   Tired, decreased energy 0  1 2   Change in appetite 0  1 3   Feeling bad or failure about yourself  0  0 0   Trouble concentrating 0  0 0   Moving slowly or fidgety/restless 0  0 3   Suicidal thoughts 0  0 0   PHQ-9 Score 0  4 12   Difficult doing work/chores Not difficult at all  Not difficult at all Somewhat difficult      Relevant past medical, surgical, family and social history reviewed and updated as indicated. Interim medical history since our last visit reviewed. Allergies and medications reviewed and updated.  Review of Systems  Constitutional: Negative for fever or weight change.  Respiratory: Negative for cough and shortness of breath.   Cardiovascular: Negative for chest pain or palpitations.  Gastrointestinal: Negative for abdominal pain, no bowel changes.  Musculoskeletal: Negative for gait problem or joint swelling.  Skin: Negative for rash.  Neurological: Negative for dizziness or headache.  No other specific complaints in a complete review of systems (except as listed in HPI above).      Objective:    BP 126/82   Pulse 84   Temp 97.8 F (36.6 C) (Oral)   Resp 16   Ht 5\' 8"  (1.727 m)   Wt 194 lb 4.8 oz (88.1 kg)   SpO2 99%   BMI 29.54 kg/m   Wt Readings from Last 3 Encounters:  10/20/22 194 lb 4.8 oz (88.1 kg)  10/05/22 190 lb 14.4 oz (86.6 kg)  09/22/22 191 lb 6 oz (86.8 kg)    Physical Exam  Constitutional: Patient appears well-developed and well-nourished.  No distress.  HEENT: head atraumatic, normocephalic, pupils equal and reactive to light, neck supple Cardiovascular: Normal rate, regular rhythm and normal heart sounds.  No murmur heard. No BLE edema. Pulmonary/Chest: Effort normal and breath sounds normal. No respiratory distress. Abdominal: Soft.  There is no tenderness. Psychiatric: Patient has a normal mood and affect. behavior is normal. Judgment and thought content normal.   Diabetic Foot Exam - Simple   Simple Foot Form Diabetic Foot exam was performed with the following findings: Yes 10/20/2022 10:11 AM  Visual Inspection No deformities, no ulcerations, no other skin breakdown bilaterally: Yes Sensation Testing Intact to touch and monofilament testing bilaterally: Yes Pulse Check Posterior Tibialis and Dorsalis pulse intact bilaterally: Yes Comments     Results for orders placed or performed in visit on 10/12/22  Glucose, capillary  Result Value Ref Range   Glucose-Capillary 133 (H) 70 - 99 mg/dL      Assessment & Plan:   Problem List Items Addressed This Visit  Cardiovascular and Mediastinum   CAD (coronary artery disease)    Managed by cardiology. Currently taking:asa 81 mg daily, atorvastatin 80 mg daily, zetia 10 mg daily, lasix 20 mg every other day, losartan 25 mg dialy, metoprolol 12.5 mg daily, potassium 10 mEq every other day with lasix and effient 10 mg daily.       Ischemic cardiomyopathy    Managed by cardiology. Currently taking:asa 81 mg daily, atorvastatin 80 mg daily, zetia 10 mg daily, lasix 20 mg every other day, losartan 25 mg dialy, metoprolol 12.5 mg daily, potassium 10 mEq every other day with lasix and effient 10 mg daily.       HFrEF (heart failure with reduced ejection fraction) (HCC)    Managed by cardiology. Currently taking:asa 81 mg daily, atorvastatin 80 mg daily, zetia 10 mg daily, lasix 20 mg every other day, losartan 25 mg dialy, metoprolol 12.5 mg daily, potassium 10 mEq every other day with lasix and effient 10 mg daily.         Endocrine   New onset type 2 diabetes mellitus (HCC)    Continue metformin 500 mg BID, follow up in 2 months for labs      Relevant Orders   Microalbumin / creatinine urine ratio   HM Diabetes Foot Exam (Completed)   Ambulatory referral to Optometry     Other   Bipolar 1 disorder (HCC) - Primary    currently taking depakote 500 mg three times a day and Lamictal 150 mg 2  times a day.      Relevant Medications   divalproex (DEPAKOTE ER) 500 MG 24 hr tablet   Mixed hyperlipidemia   S/P coronary artery stent placement   History of non-ST elevation myocardial infarction (NSTEMI)    Managed by cardiology. Currently taking:asa 81 mg daily, atorvastatin 80 mg daily, zetia 10 mg daily, lasix 20 mg every other day, losartan 25 mg dialy, metoprolol 12.5 mg daily, potassium 10 mEq every other day with lasix and effient 10 mg daily.       Other Visit Diagnoses     Encounter to establish care            Follow up plan: Return in about 2 months (around 12/20/2022) for follow up.

## 2022-10-20 NOTE — Assessment & Plan Note (Signed)
Managed by cardiology. Currently taking:asa 81 mg daily, atorvastatin 80 mg daily, zetia 10 mg daily, lasix 20 mg every other day, losartan 25 mg dialy, metoprolol 12.5 mg daily, potassium 10 mEq every other day with lasix and effient 10 mg daily.

## 2022-10-20 NOTE — Progress Notes (Signed)
Cardiology Office Note    Date:  10/21/2022   ID:  Aaron Bowers, DOB Nov 01, 1970, MRN [id number]  PCP:  [name], FNP  Cardiologist:  [name], MD  Electrophysiologist:  None   Chief Complaint: Follow-up  History of Present Illness:   Aaron Bowers is a 52 y.o. male with history of CAD with NSTEMI status post PCI/DES to the mid LAD with PTCA to a jailed D2 in 09/2022, HFrEF secondary to ICM, HLD, recently diagnosed diabetes, and bipolar disorder who presents for follow-up of his CAD and cardiomyopathy.  He was admitted to the hospital from 9/10 through 09/15/2022 after having progressive intermittent exertional chest discomfort with strenuous activities over the preceding several months and was found to have an NSTEMI. High-sensitivity troponin [place] at 17[address].  LHC showed severe single-vessel CAD with 99% stenosis of the mid LAD involving the ostium of a moderate caliber D2 branch.  There was no significant disease noted in the dominant LCx or small RCA.  Moderately to severely reduced LV systolic function with an EF of 30 to 35% with normal filling pressure with an LVEDP of 10 mmHg.  He underwent successful PCI/DES to the mid LAD and angioplasty of jailed D2.  Final angiogram showed 0% residual stenosis in the LAD with 30% residual stenosis at the ostium of D2 with TIMI-3 flow throughout the left coronary artery.  Post-intervention echo showed an EF of 35 to 40% with wall motion abnormality involving the mid to distal anteroseptal, anterior, periapical, and mid to distal lateral walls with the basal regions best preserved, normal RV systolic function, ventricular cavity size, and RVSP, moderate mitral regurgitation, and an estimated right atrial pressure of 3 mmHg.  He was seen in hospital follow-up on 09/22/2022 and was without symptoms of angina or cardiac decompensation.  He did note a couple episodes of short-lived chest discomfort that would last for less than 1 minute and  spontaneously resolved.  Symptoms did not feel similar to his prior angina.  He was working with a Data processing manager.  He was adherent and tolerating cardiac medications.  He remained out of work given strenuous job function.  He was started on losartan 12.5 mg daily with continuation of Toprol-XL 12.5 mg and furosemide 20 mg every other day with KCl 10 mEq every other day.   He comes in doing well from a cardiac perspective and is without symptoms of angina or cardiac decompensation.  He has noted a couple episodes of short-lived randomly occurring episodes of chest discomfort that would last for a few seconds and spontaneously resolved.  Symptoms do not feel similar to his prior angina.  No progressive dyspnea, lower extremity swelling, orthopnea.  He does notice some positional dizziness if he changes positions quickly.  He did miss 1 dose of Effient, as the pharmacy did not have the medication for refill.  No falls or symptoms concerning for bleeding.  Weight remains stable.  Participating with cardiac rehab and improving functional status.  Eager to return back to work.   Labs independently reviewed: 10/2022 - BUN 8, serum creatinine 0.87, potassium 4.5 09/2022 - TC 224, TG 264, HDL 31, LDL 140, A1c 7.1, LP(a) 96.2, Hgb 16.0, PLT 347, albumin 4.0, AST 49, ALT normal   Past Medical History:  Diagnosis Date   Bipolar disorder (HCC)    Borderline hyperlipidemia    CAD (coronary artery disease)    Diabetes mellitus (HCC)    Hyperlipemia     Past Surgical History:  Procedure Laterality Date   COLONOSCOPY WITH PROPOFOL N/A 05/13/2020   Procedure: COLONOSCOPY WITH PROPOFOL;  Surgeon: Toney Reil, MD;  Location: [facility] ENDOSCOPY;  Service: Gastroenterology;  Laterality: N/A;   CORONARY STENT INTERVENTION N/A 09/13/2022   Procedure: CORONARY STENT INTERVENTION;  Surgeon: [name], MD;  Location: [facility] INVASIVE CV LAB;  Service: Cardiovascular;  Laterality: N/A;   ESOPHAGOGASTRODUODENOSCOPY  (EGD) WITH PROPOFOL N/A 05/13/2020   Procedure: ESOPHAGOGASTRODUODENOSCOPY (EGD) WITH PROPOFOL;  Surgeon: Toney Reil, MD;  Location: [facility] ENDOSCOPY;  Service: Gastroenterology;  Laterality: N/A;   LEFT HEART CATH AND CORONARY ANGIOGRAPHY N/A 09/13/2022   Procedure: LEFT HEART CATH AND CORONARY ANGIOGRAPHY;  Surgeon: [name], MD;  Location: [facility] INVASIVE CV LAB;  Service: Cardiovascular;  Laterality: N/A;    Current Medications: Current Meds  Medication Sig   aspirin 81 MG chewable tablet Chew 1 tablet (81 mg total) by mouth daily.   atorvastatin (LIPITOR) 80 MG tablet Take 1 tablet (80 mg total) by mouth daily.   divalproex (DEPAKOTE ER) 500 MG 24 hr tablet Take 1 tablet (500 mg total) by mouth 3 (three) times daily.   ezetimibe (ZETIA) 10 MG tablet Take 1 tablet (10 mg total) by mouth daily.   furosemide (LASIX) 20 MG tablet Take 1 tablet (20 mg total) by mouth every other day.   lamoTRIgine (LAMICTAL) 150 MG tablet Take 150 mg by mouth 2 (two) times daily.   losartan (COZAAR) 25 MG tablet Take 0.5 tablets (12.5 mg total) by mouth daily.   metFORMIN (GLUCOPHAGE-XR) 500 MG 24 hr tablet Take 1 tablet (500 mg total) by mouth 2 (two) times daily with a meal.   metoprolol succinate (TOPROL-XL) 25 MG 24 hr tablet Take 0.5 tablets (12.5 mg total) by mouth at bedtime.   potassium chloride (KLOR-CON M) 10 MEQ tablet Take 1 tablet (10 mEq total) by mouth every other day. Take only when taking Lasix (furosemide).   prasugrel (EFFIENT) 10 MG TABS tablet Take 1 tablet (10 mg total) by mouth daily.    Allergies:   Patient has no known allergies.   Social History   Socioeconomic History   Marital status: Single    Spouse name: Not on file   Number of children: 3   Years of education: Not on file   Highest education level: 12th grade  Occupational History   Not on file  Tobacco Use   Smoking status: Never    Passive exposure: Past   Smokeless tobacco: Never  Vaping Use    Vaping status: Never Used  Substance and Sexual Activity   Alcohol use: Never   Drug use: Never   Sexual activity: Not Currently  Other Topics Concern   Not on file  Social History Narrative   Not on file   Social Determinants of Health   Financial Resource Strain: High Risk (10/16/2022)   Overall Financial Resource Strain (CARDIA)    Difficulty of Paying Living Expenses: Very hard  Food Insecurity: Food Insecurity Present (10/16/2022)   Hunger Vital Sign    Worried About Running Out of Food in the Last Year: Often true    Ran Out of Food in the Last Year: Sometimes true  Transportation Needs: No Transportation Needs (10/16/2022)   PRAPARE - Administrator, Civil Service (Medical): No    Lack of Transportation (Non-Medical): No  Physical Activity: Sufficiently Active (10/16/2022)   Exercise Vital Sign    Days of Exercise per Week: 5 days    Minutes  of Exercise per Session: 90 min  Stress: Stress Concern Present (10/16/2022)   [employer] of Occupational Health - Occupational Stress Questionnaire    Feeling of Stress : To some extent  Social Connections: Moderately Integrated (10/16/2022)   Social Connection and Isolation Panel [NHANES]    Frequency of Communication with Friends and Family: Twice a week    Frequency of Social Gatherings with Friends and Family: Twice a week    Attends Religious Services: 1 to 4 times per year    Active Member of [employer] or Organizations: Yes    Attends Engineer, structural: More than 4 times per year    Marital Status: Separated     Family History:  The patient's family history includes Asthma in his father; Deep vein thrombosis in his mother; Heart disease in his mother; Parkinson's disease in his father.  ROS:   12-point review of systems is negative unless otherwise noted in the HPI.   EKGs/Labs/Other Studies Reviewed:    Studies reviewed were summarized above. The additional studies were reviewed  today:  2D echo 09/14/2022: 1. Left ventricular ejection fraction, by estimation, is 35 to 40%. Left  ventricular ejection fraction by 3D volume is 45 %. The left ventricle has  moderately decreased function. The left ventricle demonstrates regional  wall motion abnormalities (mid to   distal anteroroseptal, anterior, periapical, mid to distal lateral walls). Basal regions best preserved. Left ventricular diastolic parameters are  indeterminate. The average left ventricular global longitudinal strain is  -6.7 %. The global longitudinal strain is abnormal.   2. Right ventricular systolic function is normal. The right ventricular  size is normal. There is normal pulmonary artery systolic pressure. The  estimated right ventricular systolic pressure is 32.8 mmHg.   3. The mitral valve is normal in structure. Moderate mitral valve  regurgitation. No evidence of mitral stenosis.   4. The aortic valve is tricuspid. Aortic valve regurgitation is not  visualized. No aortic stenosis is present.   5. The inferior vena cava is normal in size with greater than 50%  respiratory variability, suggesting right atrial pressure of 3 mmHg.  __________   LHC 09/13/2022: Conclusions: Severe single-vessel coronary artery disease with 99% stenosis of mid LAD involving ostium of moderate-caliber D2 branch.  No significant disease noted in dominant LCx or small RCA. Moderately to severely reduced left ventricular systolic function (LVEF 30-35%) with normal filling pressure (LVEDP 10 mmHg). Successful PCI to mid LAD using Onyx Frontier 2.75 x 38 mm drug-eluting stent (postdilated to 3.1 mm) and angioplasty of jailed D2 branch using 2.0 mm balloon.  Final angiogram shows 0% residual stenosis in the LAD and 30% residual stenosis at the ostium of D2.  There is TIMI-3 flow throughout the left coronary artery.   Recommendations: Continue tirofiban infusion for 4 hours. Dual antiplatelet therapy with aspirin and prasugrel  for at least 12 months. Aggressive secondary prevention of coronary artery disease. Obtain echocardiogram.  If LVEF confirmed to be less than 35%, consider LifeVest at discharge.   EKG:  EKG is not ordered today.    Recent Labs: 09/13/2022: ALT 22 09/29/2022: Hemoglobin 16.0; Platelets 347 10/10/2022: BUN 8; Creatinine, Ser 0.87; Potassium 4.5; Sodium 140  Recent Lipid Panel    Component Value Date/Time   CHOL 224 (H) 09/14/2022 0247   TRIG 264 (H) 09/14/2022 0247   HDL 31 (L) 09/14/2022 0247   CHOLHDL 7.2 09/14/2022 0247   VLDL 53 (H) 09/14/2022 0247   LDLCALC 140 (H)  09/14/2022 0247    PHYSICAL EXAM:    VS:  BP 108/80 (BP Location: Left Arm, Patient Position: Sitting, Cuff Size: Normal)   Pulse 90   Ht 5\' 8"  (1.727 m)   Wt 189 lb (85.7 kg)   SpO2 98%   BMI 28.74 kg/m   BMI: Body mass index is 28.74 kg/m.  Physical Exam Vitals reviewed.  Constitutional:      Appearance: He is well-developed.  HENT:     Head: Normocephalic and atraumatic.  Eyes:     General:        Right eye: No discharge.        Left eye: No discharge.  Cardiovascular:     Rate and Rhythm: Normal rate and regular rhythm.     Heart sounds: Normal heart sounds, S1 normal and S2 normal. Heart sounds not distant. No midsystolic click and no opening snap. No murmur heard.    No friction rub.  Pulmonary:     Effort: Pulmonary effort is normal. No respiratory distress.     Breath sounds: Normal breath sounds. No decreased breath sounds, wheezing, rhonchi or rales.  Chest:     Chest wall: No tenderness.  Abdominal:     General: There is no distension.  Musculoskeletal:     Cervical back: Normal range of motion.     Right lower leg: No edema.     Left lower leg: No edema.  Skin:    General: Skin is warm and dry.     Nails: There is no clubbing.  Neurological:     Mental Status: He is alert and oriented to person, place, and time.  Psychiatric:        Speech: Speech normal.        Behavior:  Behavior normal.        Thought Content: Thought content normal.        Judgment: Judgment normal.     Wt Readings from Last 3 Encounters:  10/21/22 189 lb (85.7 kg)  10/20/22 194 lb 4.8 oz (88.1 kg)  10/05/22 190 lb 14.4 oz (86.6 kg)     ASSESSMENT & PLAN:   CAD involving the native coronary arteries with high risk NSTEMI without angina: He is doing well and is without symptoms concerning for angina or cardiac decompensation.  Continue DAPT with aspirin 81 mg and Effient 10 mg daily without interruption for a minimum of 12 months dating back to date of PCI (09/13/2022).  Continue aggressive risk factor modification and secondary prevention including atorvastatin, ezetimibe, and Toprol-XL.  He has remained out of work given the strenuous nature of his job in the context of high risk NSTEMI and acute HFrEF with ischemic cardiomyopathy.  He is progressing well with cardiac rehab.  Okay to increase workload to 25 pounds with anticipation to return to full duty on 11/07/2022.  No falls or symptoms concerning for bleeding on DAPT.  No indication for further ischemic testing at this time.  HFrEF secondary to ICM: Euvolemic and well compensated with NYHA class I-II symptoms.  Continue current GDMT with losartan 12.5 mg, Toprol-XL 12.5 mg, and furosemide 20 mg every other day.  Relative hypotension with some orthostasis precludes escalation of evidence-based medical therapy at this time.  Moving forward, would look to further escalate GDMT with addition of SGLT2 inhibitor, particularly with recently diagnosed diabetes, or MRA with possible transition of ARB to ARNI as vital signs and labs allow.  Recent labs stable.  No indication for LifeVest with EF  of 40%.  Plan for follow-up echo in 2 months to reevaluate cardiomyopathy following PCI and optimization of GDMT (mid 12/2022).  HLD: LDL 140 with target LDL less than 55.  Tolerating atorvastatin 80 mg and ezetimibe.  Look to repeat fasting lipid panel and LFT  at next visit with recommendation to escalate lipid-lowering therapy as indicated to achieve target LDL.  Recently diagnosed diabetes: A1c 7.1 during recent admission.  Would benefit from SGLT2 inhibitor addition.  Management per PCP.      Disposition: F/u with Dr. Okey Dupre or an APP in 1 month to escalate GDMT as able.   Medication Adjustments/Labs and Tests Ordered: Current medicines are reviewed at length with the patient today.  Concerns regarding medicines are outlined above. Medication changes, Labs and Tests ordered today are summarized above and listed in the Patient Instructions accessible in Encounters.   Signed, Eula Listen, PA-C 10/21/2022 4:45 PM     [facility] [place] [address] [place], [place] [address] [phone]

## 2022-10-20 NOTE — Assessment & Plan Note (Signed)
Continue metformin 500 mg BID, follow up in 2 months for labs

## 2022-10-20 NOTE — Assessment & Plan Note (Signed)
currently taking depakote 500 mg three times a day and Lamictal 150 mg 2 times a day.

## 2022-10-21 ENCOUNTER — Encounter: Payer: 59 | Admitting: *Deleted

## 2022-10-21 ENCOUNTER — Encounter: Payer: Self-pay | Admitting: Physician Assistant

## 2022-10-21 ENCOUNTER — Ambulatory Visit: Payer: 59 | Attending: Physician Assistant | Admitting: Physician Assistant

## 2022-10-21 VITALS — BP 108/80 | HR 90 | Ht 68.0 in | Wt 189.0 lb

## 2022-10-21 DIAGNOSIS — I2510 Atherosclerotic heart disease of native coronary artery without angina pectoris: Secondary | ICD-10-CM

## 2022-10-21 DIAGNOSIS — I214 Non-ST elevation (NSTEMI) myocardial infarction: Secondary | ICD-10-CM | POA: Diagnosis not present

## 2022-10-21 DIAGNOSIS — E785 Hyperlipidemia, unspecified: Secondary | ICD-10-CM

## 2022-10-21 DIAGNOSIS — I5020 Unspecified systolic (congestive) heart failure: Secondary | ICD-10-CM | POA: Diagnosis not present

## 2022-10-21 DIAGNOSIS — I255 Ischemic cardiomyopathy: Secondary | ICD-10-CM | POA: Diagnosis not present

## 2022-10-21 DIAGNOSIS — Z955 Presence of coronary angioplasty implant and graft: Secondary | ICD-10-CM

## 2022-10-21 LAB — MICROALBUMIN / CREATININE URINE RATIO
Creatinine, Urine: 12 mg/dL — ABNORMAL LOW (ref 20–320)
Microalb, Ur: <0.2 mg/dL

## 2022-10-21 NOTE — Patient Instructions (Signed)
Medication Instructions:  - Your physician recommends that you continue on your current medications as directed. Please refer to the Current Medication list given to you today.  *If you need a refill on your cardiac medications before your next appointment, please call your pharmacy*   Lab Work: - none ordered  If you have labs (blood work) drawn today and your tests are completely normal, you will receive your results only by: MyChart Message (if you have MyChart) OR A paper copy in the mail If you have any lab test that is abnormal or we need to change your treatment, we will call you to review the results.   Testing/Procedures:  1) Limited Echocardiogram: in 2 months (after 12/14/22) - Your physician has requested that you have an echocardiogram. Echocardiography is a painless test that uses sound waves to create images of your heart. It provides your doctor with information about the size and shape of your heart and how well your heart's chambers and valves are working. This procedure takes approximately one hour. There are no restrictions for this procedure. There is a possibility that an IV may need to be started during your test to inject an image enhancing agent. This is done to obtain more optimal pictures of your heart. Therefore we ask that you do at least drink some water prior to coming in to hydrate your veins.   Please do NOT wear cologne, perfume, aftershave, or lotions (deodorant is allowed). Please arrive 15 minutes prior to your appointment time.    Follow-Up: At [facility], you and your health needs are our priority.  As part of our continuing mission to provide you with exceptional heart care, we have created designated Provider Care Teams.  These Care Teams include your primary Cardiologist (physician) and Advanced Practice Providers (APPs -  Physician Assistants and Nurse Practitioners) who all work together to provide you with the care you need, when you  need it.  We recommend signing up for the patient portal called "MyChart".  Sign up information is provided on this After Visit Summary.  MyChart is used to connect with patients for Virtual Visits (Telemedicine).  Patients are able to view lab/test results, encounter notes, upcoming appointments, etc.  Non-urgent messages can be sent to your provider as well.   To learn more about what you can do with MyChart, go to [email or web address].    Your next appointment:   1 month(s)  Provider:   Eula Listen, PA-C    Other Instructions  Echocardiogram An echocardiogram is a test that uses sound waves to make images of your heart. This way of making images is often called ultrasound. The images from this test can help find out many things about your heart, including: The size and shape of your heart. The strength of your heart muscle and how well it's working. The size, thickness, and movement of your heart's walls. How your heart valves are working. Problems such as: A tumor or a growth from an infection around the heart valves. Areas of heart muscle that aren't working well because of poor blood flow or injury from a heart attack. An aneurysm. This is a weak or damaged part of an artery wall. An artery is a blood vessel. Tell a health care provider about: Any allergies you have. All medicines you're taking, including vitamins, herbs, eye drops, creams, and over-the-counter medicines. Any bleeding problems you have. Any surgeries you've had. Any medical problems you have. Whether you're pregnant or may be pregnant.  What are the risks? Your health care provider will talk with you about risks. These may include an allergic reaction to IV dye that may be used during the test. What happens before the test? You don't need to do anything to get ready for this test. You may eat and drink normally. What happens during the test?  You'll take off your clothes from the waist up and put on a  hospital gown. Sticky patches called electrodes may be placed on your chest. These will be connected to a machine that monitors your heart rate and rhythm. You'll lie down on a table for the exam. A wand covered in gel will be moved over your chest. Sound waves from the wand will go to your heart and bounce back--or "echo" back. The sound waves will go to a computer that uses them to make images of your heart. The images can be viewed on a monitor. The images will also be recorded on the computer so your provider can look at them later. You may be asked to change positions or hold your breath for a short time. This makes it easier to get different views or better views of your heart. In some cases, you may be given a dye through an IV. The IV is put into one of your veins. This dye can make the areas of your heart easier to see. The procedure may vary among providers and hospitals. What can I expect after the test? You may return to your normal diet, activities, and medicines unless your provider tells you not to. If an IV was placed for the test, it will be removed. It's up to you to get the results of your test. Ask your provider, or the department that's doing the test, when your results will be ready. This information is not intended to replace advice given to you by your health care provider. Make sure you discuss any questions you have with your health care provider. Document Revised: 02/18/2022 Document Reviewed: 02/18/2022 Elsevier Patient Education  2024 [employer].

## 2022-10-21 NOTE — Progress Notes (Signed)
Daily Session Note  Patient Details  Name: Aaron Bowers MRN: [id number] Date of Birth: 1970/06/25 Referring Provider:   Flowsheet Row Cardiac Rehab from 10/05/2022 in [facility] Cardiac and Pulmonary Rehab  Referring Provider Dr. [name], MD       Encounter Date: 10/21/2022  Check In:  Session Check In - 10/21/22 0807       Check-In   Supervising physician immediately available to respond to emergencies See telemetry face sheet for immediately available ER MD    Location [facility]-Cardiac & Pulmonary Rehab    Staff Present Cora Collum, RN, BSN, CCRP;Noah Tickle, BS, Exercise Physiologist;Joseph Junior, [place]    Virtual Visit No    Medication changes reported     No    Fall or balance concerns reported    No    Warm-up and Cool-down Performed on first and last piece of equipment    Resistance Training Performed Yes    VAD Patient? No    PAD/SET Patient? No      Pain Assessment   Currently in Pain? No/denies                Social History   Tobacco Use  Smoking Status Never   Passive exposure: Past  Smokeless Tobacco Never    Goals Met:  Independence with exercise equipment Exercise tolerated well No report of concerns or symptoms today  Goals Unmet:  Not Applicable  Comments: Pt able to follow exercise prescription today without complaint.  Will continue to monitor for progression.    Dr. Bethann Punches is Medical Director for [facility] Cardiac Rehabilitation.  Dr. Vida Rigger is Medical Director for [facility] Pulmonary Rehabilitation.

## 2022-10-24 ENCOUNTER — Encounter: Payer: 59 | Admitting: *Deleted

## 2022-10-24 DIAGNOSIS — I214 Non-ST elevation (NSTEMI) myocardial infarction: Secondary | ICD-10-CM

## 2022-10-24 DIAGNOSIS — Z955 Presence of coronary angioplasty implant and graft: Secondary | ICD-10-CM

## 2022-10-24 NOTE — Progress Notes (Signed)
Daily Session Note  Patient Details  Name: Aaron Bowers MRN: [id number] Date of Birth: 1970/02/14 Referring Provider:   Flowsheet Row Cardiac Rehab from 10/05/2022 in [facility] Cardiac and Pulmonary Rehab  Referring Provider Dr. [name], MD       Encounter Date: 10/24/2022  Check In:  Session Check In - 10/24/22 0819       Check-In   Supervising physician immediately available to respond to emergencies See telemetry face sheet for immediately available ER MD    Location [facility]-Cardiac & Pulmonary Rehab    Staff Present Cora Collum, RN, BSN, CCRP;Jason Wallace [place], RDN, LDN;Joseph [place], RCP,RRT,BSRT;Kelly [place], BS, ACSM CEP, Exercise Physiologist    Virtual Visit No    Medication changes reported     No    Fall or balance concerns reported    No    Warm-up and Cool-down Performed on first and last piece of equipment    Resistance Training Performed Yes    VAD Patient? No    PAD/SET Patient? No      Pain Assessment   Currently in Pain? No/denies                Social History   Tobacco Use  Smoking Status Never   Passive exposure: Past  Smokeless Tobacco Never    Goals Met:  Independence with exercise equipment Exercise tolerated well No report of concerns or symptoms today  Goals Unmet:  Not Applicable  Comments: Pt able to follow exercise prescription today without complaint.  Will continue to monitor for progression.    Dr. Bethann Punches is Medical Director for [facility] Cardiac Rehabilitation.  Dr. Vida Rigger is Medical Director for [facility] Pulmonary Rehabilitation.

## 2022-10-26 ENCOUNTER — Encounter: Payer: 59 | Admitting: *Deleted

## 2022-10-26 DIAGNOSIS — I214 Non-ST elevation (NSTEMI) myocardial infarction: Secondary | ICD-10-CM | POA: Diagnosis not present

## 2022-10-26 DIAGNOSIS — Z955 Presence of coronary angioplasty implant and graft: Secondary | ICD-10-CM

## 2022-10-26 NOTE — Progress Notes (Signed)
Daily Session Note  Patient Details  Name: Aaron Bowers MRN: [id number] Date of Birth: 1970/08/27 Referring Provider:   Flowsheet Row Cardiac Rehab from 10/05/2022 in [facility] Cardiac and Pulmonary Rehab  Referring Provider Dr. [name], MD       Encounter Date: 10/26/2022  Check In:  Session Check In - 10/26/22 0755       Check-In   Supervising physician immediately available to respond to emergencies See telemetry face sheet for immediately available ER MD    Location [facility]-Cardiac & Pulmonary Rehab    Staff Present Rory Percy, MS, Exercise Physiologist;Joseph Reino Kent, RCP,RRT,BSRT;Maxon Benjamin Perez BS, , Exercise Physiologist;Laurie Penado Katrinka Blazing, RN, ADN    Virtual Visit No    Medication changes reported     No    Fall or balance concerns reported    No    Warm-up and Cool-down Performed on first and last piece of equipment    Resistance Training Performed Yes    VAD Patient? No    PAD/SET Patient? No      Pain Assessment   Currently in Pain? No/denies                Social History   Tobacco Use  Smoking Status Never   Passive exposure: Past  Smokeless Tobacco Never    Goals Met:  Independence with exercise equipment Exercise tolerated well No report of concerns or symptoms today Strength training completed today  Goals Unmet:  Not Applicable  Comments: Pt able to follow exercise prescription today without complaint.  Will continue to monitor for progression.    Dr. Bethann Punches is Medical Director for [facility] Cardiac Rehabilitation.  Dr. Vida Rigger is Medical Director for [facility] Pulmonary Rehabilitation.

## 2022-10-28 ENCOUNTER — Encounter: Payer: 59 | Admitting: *Deleted

## 2022-10-28 DIAGNOSIS — Z955 Presence of coronary angioplasty implant and graft: Secondary | ICD-10-CM

## 2022-10-28 DIAGNOSIS — I214 Non-ST elevation (NSTEMI) myocardial infarction: Secondary | ICD-10-CM

## 2022-10-28 NOTE — Progress Notes (Signed)
Daily Session Note  Patient Details  Name: Aaron Bowers MRN: [id number] Date of Birth: 06-23-1970 Referring Provider:   Flowsheet Row Cardiac Rehab from 10/05/2022 in [facility] Cardiac and Pulmonary Rehab  Referring Provider Dr. [name], MD       Encounter Date: 10/28/2022  Check In:  Session Check In - 10/28/22 0820       Check-In   Supervising physician immediately available to respond to emergencies See telemetry face sheet for immediately available ER MD    Location [facility]-Cardiac & Pulmonary Rehab    Staff Present Cora Collum, RN, BSN, CCRP;Noah Tickle, BS, Exercise Physiologist;Joseph [place], [place]    Virtual Visit No    Medication changes reported     No    Fall or balance concerns reported    No    Warm-up and Cool-down Performed on first and last piece of equipment    Resistance Training Performed Yes    VAD Patient? No    PAD/SET Patient? No      Pain Assessment   Currently in Pain? No/denies                Social History   Tobacco Use  Smoking Status Never   Passive exposure: Past  Smokeless Tobacco Never    Goals Met:  Independence with exercise equipment Exercise tolerated well No report of concerns or symptoms today  Goals Unmet:  Not Applicable  Comments: Pt able to follow exercise prescription today without complaint.  Will continue to monitor for progression.    Dr. Bethann Punches is Medical Director for [facility] Cardiac Rehabilitation.  Dr. Vida Rigger is Medical Director for [facility] Pulmonary Rehabilitation.

## 2022-10-31 ENCOUNTER — Encounter: Payer: 59 | Admitting: *Deleted

## 2022-10-31 DIAGNOSIS — I214 Non-ST elevation (NSTEMI) myocardial infarction: Secondary | ICD-10-CM | POA: Diagnosis not present

## 2022-10-31 DIAGNOSIS — Z955 Presence of coronary angioplasty implant and graft: Secondary | ICD-10-CM

## 2022-10-31 NOTE — Progress Notes (Signed)
Daily Session Note  Patient Details  Name: Aaron Bowers MRN: [id number] Date of Birth: February 06, 1970 Referring Provider:   Flowsheet Row Cardiac Rehab from 10/05/2022 in [facility] Cardiac and Pulmonary Rehab  Referring Provider Dr. [name], MD       Encounter Date: 10/31/2022  Check In:  Session Check In - 10/31/22 0749       Check-In   Supervising physician immediately available to respond to emergencies See telemetry face sheet for immediately available ER MD    Location [facility]-Cardiac & Pulmonary Rehab    Staff Present Cora Collum, RN, BSN, CCRP;Jason Wallace [place], RDN, LDN;Joseph [place], RCP,RRT,BSRT;Kelly [place], BS, ACSM CEP, Exercise Physiologist    Virtual Visit No    Medication changes reported     No    Fall or balance concerns reported    No    Warm-up and Cool-down Performed on first and last piece of equipment    Resistance Training Performed Yes    VAD Patient? No    PAD/SET Patient? No      Pain Assessment   Currently in Pain? No/denies                Social History   Tobacco Use  Smoking Status Never   Passive exposure: Past  Smokeless Tobacco Never    Goals Met:  Independence with exercise equipment Exercise tolerated well No report of concerns or symptoms today  Goals Unmet:  Not Applicable  Comments: Pt able to follow exercise prescription today without complaint.  Will continue to monitor for progression.    Dr. Bethann Punches is Medical Director for [facility] Cardiac Rehabilitation.  Dr. Vida Rigger is Medical Director for [facility] Pulmonary Rehabilitation.

## 2022-11-02 ENCOUNTER — Encounter: Payer: 59 | Admitting: *Deleted

## 2022-11-02 ENCOUNTER — Encounter: Payer: Self-pay | Admitting: *Deleted

## 2022-11-02 DIAGNOSIS — Z955 Presence of coronary angioplasty implant and graft: Secondary | ICD-10-CM

## 2022-11-02 DIAGNOSIS — I214 Non-ST elevation (NSTEMI) myocardial infarction: Secondary | ICD-10-CM

## 2022-11-02 NOTE — Progress Notes (Signed)
Cardiac Individual Treatment Plan  Patient Details  Name: Aaron Bowers MRN: [id number] Date of Birth: 14-Apr-1970 Referring Provider:   Flowsheet Row Cardiac Rehab from 10/05/2022 in [facility] Cardiac and Pulmonary Rehab  Referring Provider Dr. [name], MD       Initial Encounter Date:  Flowsheet Row Cardiac Rehab from 10/05/2022 in [facility] Cardiac and Pulmonary Rehab  Date 10/05/22       Visit Diagnosis: NSTEMI (non-ST elevated myocardial infarction) [facility]  Status post coronary artery stent placement  Patient's Home Medications on Admission:  Current Outpatient Medications:    aspirin 81 MG chewable tablet, Chew 1 tablet (81 mg total) by mouth daily., Disp: 30 tablet, Rfl: 3   atorvastatin (LIPITOR) 80 MG tablet, Take 1 tablet (80 mg total) by mouth daily., Disp: 30 tablet, Rfl: 3   divalproex (DEPAKOTE ER) 500 MG 24 hr tablet, Take 1 tablet (500 mg total) by mouth 3 (three) times daily., Disp: 270 tablet, Rfl: 1   ezetimibe (ZETIA) 10 MG tablet, Take 1 tablet (10 mg total) by mouth daily., Disp: 30 tablet, Rfl: 3   furosemide (LASIX) 20 MG tablet, Take 1 tablet (20 mg total) by mouth every other day., Disp: 90 tablet, Rfl: 3   lamoTRIgine (LAMICTAL) 150 MG tablet, Take 150 mg by mouth 2 (two) times daily., Disp: , Rfl:    losartan (COZAAR) 25 MG tablet, Take 0.5 tablets (12.5 mg total) by mouth daily., Disp: 90 tablet, Rfl: 3   metFORMIN (GLUCOPHAGE-XR) 500 MG 24 hr tablet, Take 1 tablet (500 mg total) by mouth 2 (two) times daily with a meal., Disp: 60 tablet, Rfl: 3   metoprolol succinate (TOPROL-XL) 25 MG 24 hr tablet, Take 0.5 tablets (12.5 mg total) by mouth at bedtime., Disp: 15 tablet, Rfl: 3   potassium chloride (KLOR-CON M) 10 MEQ tablet, Take 1 tablet (10 mEq total) by mouth every other day. Take only when taking Lasix (furosemide)., Disp: 90 tablet, Rfl: 3   prasugrel (EFFIENT) 10 MG TABS tablet, Take 1 tablet (10 mg total) by mouth daily., Disp: 30 tablet, Rfl:  3  Past Medical History: Past Medical History:  Diagnosis Date   Bipolar disorder (HCC)    Borderline hyperlipidemia    CAD (coronary artery disease)    Diabetes mellitus (HCC)    Hyperlipemia     Tobacco Use: Social History   Tobacco Use  Smoking Status Never   Passive exposure: Past  Smokeless Tobacco Never    Labs: Review Flowsheet       Latest Ref Rng & Units 09/14/2022  Labs for ITP Cardiac and Pulmonary Rehab  Cholestrol 0 - 200 mg/dL 409   LDL (calc) 0 - 99 mg/dL 811   HDL-C >91 mg/dL 31   Trlycerides <478 mg/dL 295   Hemoglobin A2Z 4.8 - 5.6 % 7.1     Details             Exercise Target Goals: Exercise Program Goal: Individual exercise prescription set using results from initial 6 min walk test and THRR while considering  patient's activity barriers and safety.   Exercise Prescription Goal: Initial exercise prescription builds to 30-45 minutes a day of aerobic activity, 2-3 days per week.  Home exercise guidelines will be given to patient during program as part of exercise prescription that the participant will acknowledge.   Education: Aerobic Exercise: - Group verbal and visual presentation on the components of exercise prescription. Introduces F.I.T.T principle from ACSM for exercise prescriptions.  Reviews F.I.T.T.  principles of aerobic exercise including progression. Written material given at graduation. Flowsheet Row Cardiac Rehab from 11/02/2022 in [facility] Cardiac and Pulmonary Rehab  Education need identified 10/05/22  Date 11/02/22  Educator NT  Instruction Review Code 1- Verbalizes Understanding       Education: Resistance Exercise: - Group verbal and visual presentation on the components of exercise prescription. Introduces F.I.T.T principle from ACSM for exercise prescriptions  Reviews F.I.T.T. principles of resistance exercise including progression. Written material given at graduation. Flowsheet Row Cardiac Rehab from 11/02/2022 in [facility]  Cardiac and Pulmonary Rehab  Date 10/26/22  Educator NT  Instruction Review Code 1- Verbalizes Understanding        Education: Exercise & Equipment Safety: - Individual verbal instruction and demonstration of equipment use and safety with use of the equipment. Flowsheet Row Cardiac Rehab from 11/02/2022 in [facility] Cardiac and Pulmonary Rehab  Date 10/05/22  Educator NT  Instruction Review Code 1- Verbalizes Understanding       Education: Exercise Physiology & General Exercise Guidelines: - Group verbal and written instruction with models to review the exercise physiology of the cardiovascular system and associated critical values. Provides general exercise guidelines with specific guidelines to those with heart or lung disease.  Flowsheet Row Cardiac Rehab from 11/02/2022 in [facility] Cardiac and Pulmonary Rehab  Date 10/19/22  Educator NT  Instruction Review Code 1- [employer] Understanding       Education: Flexibility, Balance, Mind/Body Relaxation: - Group verbal and visual presentation with interactive activity on the components of exercise prescription. Introduces F.I.T.T principle from ACSM for exercise prescriptions. Reviews F.I.T.T. principles of flexibility and balance exercise training including progression. Also discusses the mind body connection.  Reviews various relaxation techniques to help reduce and manage stress (i.e. Deep breathing, progressive muscle relaxation, and visualization). Balance handout provided to take home. Written material given at graduation. Flowsheet Row Cardiac Rehab from 11/02/2022 in [facility] Cardiac and Pulmonary Rehab  Date 10/26/22  Educator NT  Instruction Review Code 1- Verbalizes Understanding       Activity Barriers & Risk Stratification:  Activity Barriers & Cardiac Risk Stratification - 10/05/22 1134       Activity Barriers & Cardiac Risk Stratification   Activity Barriers Neck/Spine Problems   bulging disc   Cardiac Risk Stratification  Moderate             6 Minute Walk:  6 Minute Walk     Row Name 10/05/22 1132         6 Minute Walk   Phase Initial     Distance 1340 feet     Walk Time 6 minutes     # of Rest Breaks 0     MPH 2.54     METS 3.92     RPE 7     Perceived Dyspnea  0     VO2 Peak 13.71     Symptoms No     Resting HR 62 bpm     Resting BP 118/68     Resting Oxygen Saturation  98 %     Exercise Oxygen Saturation  during 6 min walk 99 %     Max Ex. HR 94 bpm     Max Ex. BP 136/72     2 Minute Post BP 128/68              Oxygen Initial Assessment:   Oxygen Re-Evaluation:   Oxygen Discharge (Final Oxygen Re-Evaluation):   Initial Exercise Prescription:  Initial Exercise Prescription -  10/05/22 1100       Date of Initial Exercise RX and Referring Provider   Date 10/05/22    Referring Provider Dr. [name], MD      Oxygen   Maintain Oxygen Saturation 88% or higher      Treadmill   MPH 2.2    Grade 1    Minutes 15    METs 2.99      NuStep   Level 3    SPM 80    Minutes 15    METs 3.92      REL-XR   Level 3    Speed 50    Minutes 15    METs 3.92      Prescription Details   Frequency (times per week) 3    Duration Progress to 30 minutes of continuous aerobic without signs/symptoms of physical distress      Intensity   THRR 40-80% of Max Heartrate 104-146    Ratings of Perceived Exertion 11-13    Perceived Dyspnea 0-4      Progression   Progression Continue to progress workloads to maintain intensity without signs/symptoms of physical distress.      Resistance Training   Training Prescription Yes    Weight 5 lb    Reps 10-15             Perform Capillary Blood Glucose checks as needed.  Exercise Prescription Changes:   Exercise Prescription Changes     Row Name 10/05/22 1100 10/13/22 1300 10/26/22 0700         Response to Exercise   Blood Pressure (Admit) 118/68 128/70 114/66     Blood Pressure (Exercise) 136/72 130/62 140/82      Blood Pressure (Exit) 128/68 108/60 122/64     Heart Rate (Admit) 62 bpm 82 bpm 80 bpm     Heart Rate (Exercise) 94 bpm 98 bpm 135 bpm     Heart Rate (Exit) 66 bpm 72 bpm 90 bpm     Oxygen Saturation (Admit) 98 % -- --     Oxygen Saturation (Exercise) 99 % -- --     Rating of Perceived Exertion (Exercise) 7 11 15      Perceived Dyspnea (Exercise) 0 -- --     Symptoms None none none     Comments Results First full day of exercise --     Duration -- Continue with 30 min of aerobic exercise without signs/symptoms of physical distress. Continue with 30 min of aerobic exercise without signs/symptoms of physical distress.     Intensity -- THRR unchanged THRR unchanged       Progression   Progression -- Continue to progress workloads to maintain intensity without signs/symptoms of physical distress. Continue to progress workloads to maintain intensity without signs/symptoms of physical distress.     Average METs -- 3.4 5.84       Resistance Training   Training Prescription -- Yes Yes     Weight -- 5 lb 5 lb     Reps -- 10-15 10-15       Interval Training   Interval Training -- No No       Treadmill   MPH -- 2.2 2.9     Grade -- 2 8     Minutes -- 15 15     METs -- 3.29 6.42       NuStep   Level -- -- 6     Minutes -- -- 15     METs -- --  6.5       REL-XR   Level -- -- 7     Minutes -- -- 15     METs -- -- 6.4       Biostep-RELP   Level -- 4 --     Minutes -- 15 --     METs -- 3.5 --       Oxygen   Maintain Oxygen Saturation -- 88% or higher 88% or higher              Exercise Comments:   Exercise Comments     Row Name 10/07/22 0827           Exercise Comments First full day of exercise!  Patient was oriented to gym and equipment including functions, settings, policies, and procedures.  Patient's individual exercise prescription and treatment plan were reviewed.  All starting workloads were established based on the results of the 6 minute walk test done  at initial orientation visit.  The plan for exercise progression was also introduced and progression will be customized based on patient's performance and goals.                Exercise Goals and Review:   Exercise Goals     Row Name 10/05/22 1134             Exercise Goals   Increase Physical Activity Yes       Intervention Provide advice, education, support and counseling about physical activity/exercise needs.;Develop an individualized exercise prescription for aerobic and resistive training based on initial evaluation findings, risk stratification, comorbidities and participant's personal goals.       Expected Outcomes Short Term: Attend rehab on a regular basis to increase amount of physical activity.;Long Term: Add in home exercise to make exercise part of routine and to increase amount of physical activity.;Long Term: Exercising regularly at least 3-5 days a week.       Increase Strength and Stamina Yes       Intervention Provide advice, education, support and counseling about physical activity/exercise needs.;Develop an individualized exercise prescription for aerobic and resistive training based on initial evaluation findings, risk stratification, comorbidities and participant's personal goals.       Expected Outcomes Short Term: Increase workloads from initial exercise prescription for resistance, speed, and METs.;Short Term: Perform resistance training exercises routinely during rehab and add in resistance training at home;Long Term: Improve cardiorespiratory fitness, muscular endurance and strength as measured by increased METs and functional capacity ( )       Able to understand and use rate of perceived exertion (RPE) scale Yes       Intervention Provide education and explanation on how to use RPE scale       Expected Outcomes Short Term: Able to use RPE daily in rehab to express subjective intensity level;Long Term:  Able to use RPE to guide intensity level when  exercising independently       Able to understand and use Dyspnea scale Yes       Intervention Provide education and explanation on how to use Dyspnea scale       Expected Outcomes Short Term: Able to use Dyspnea scale daily in rehab to express subjective sense of shortness of breath during exertion;Long Term: Able to use Dyspnea scale to guide intensity level when exercising independently       Knowledge and understanding of Target Heart Rate Range (THRR) Yes       Intervention Provide education and explanation of THRR  including how the numbers were predicted and where they are located for reference       Expected Outcomes Short Term: Able to state/look up THRR;Long Term: Able to use THRR to govern intensity when exercising independently;Short Term: Able to use daily as guideline for intensity in rehab       Able to check pulse independently Yes       Intervention Provide education and demonstration on how to check pulse in carotid and radial arteries.;Review the importance of being able to check your own pulse for safety during independent exercise       Expected Outcomes Short Term: Able to explain why pulse checking is important during independent exercise;Long Term: Able to check pulse independently and accurately       Understanding of Exercise Prescription Yes       Intervention Provide education, explanation, and written materials on patient's individual exercise prescription       Expected Outcomes Short Term: Able to explain program exercise prescription;Long Term: Able to explain home exercise prescription to exercise independently                Exercise Goals Re-Evaluation :  Exercise Goals Re-Evaluation     Row Name 10/07/22 0828 10/13/22 1346 10/26/22 0746         Exercise Goal Re-Evaluation   Exercise Goals Review Able to understand and use rate of perceived exertion (RPE) scale;Knowledge and understanding of Target Heart Rate Range (THRR);Able to understand and use  Dyspnea scale;Understanding of Exercise Prescription Increase Physical Activity;Increase Strength and Stamina;Understanding of Exercise Prescription Increase Physical Activity;Increase Strength and Stamina;Understanding of Exercise Prescription     Comments Reviewed RPE and dyspnea scale, THR and program prescription with pt today.  Pt voiced understanding and was given a copy of goals to take home. Aaron Bowers is off to a good start in the program. During his first session, he did well on the treadmill at a speed of 2.2 mph with an inlcine of 2%. He also did well on the biostep at level 4. We will continue to monitor his progress in the program. Aaron Bowers is doing well in rehab. He increased his treadmill workload to a speed of 2.9 mph with a 8% incline. He also improved to level 7 on the XR and level 6 on the T4 nustep. He continues to use 5 lb hand weights for resistance training as well. We will continue to monitor his progress in the program.     Expected Outcomes Short: Use RPE daily to regulate intensity. Long: Follow program prescription in THR. Short: Continue to follow current exercise prescription. Long: Continue exercise to improve strength and stamina. Short: Continue to progressively increase treadmill workload. Long: Continue exercise to improve strength and stamina.              Discharge Exercise Prescription (Final Exercise Prescription Changes):  Exercise Prescription Changes - 10/26/22 0700       Response to Exercise   Blood Pressure (Admit) 114/66    Blood Pressure (Exercise) 140/82    Blood Pressure (Exit) 122/64    Heart Rate (Admit) 80 bpm    Heart Rate (Exercise) 135 bpm    Heart Rate (Exit) 90 bpm    Rating of Perceived Exertion (Exercise) 15    Symptoms none    Duration Continue with 30 min of aerobic exercise without signs/symptoms of physical distress.    Intensity THRR unchanged      Progression   Progression Continue to progress workloads  to maintain intensity  without signs/symptoms of physical distress.    Average METs 5.84      Resistance Training   Training Prescription Yes    Weight 5 lb    Reps 10-15      Interval Training   Interval Training No      Treadmill   MPH 2.9    Grade 8    Minutes 15    METs 6.42      NuStep   Level 6    Minutes 15    METs 6.5      REL-XR   Level 7    Minutes 15    METs 6.4      Oxygen   Maintain Oxygen Saturation 88% or higher             Nutrition:  Target Goals: Understanding of nutrition guidelines, daily intake of sodium 1500mg , cholesterol 200mg , calories 30% from fat and 7% or less from saturated fats, daily to have 5 or more servings of fruits and vegetables.  Education: All About Nutrition: -Group instruction provided by verbal, written material, interactive activities, discussions, models, and posters to present general guidelines for heart healthy nutrition including fat, fiber, MyPlate, the role of sodium in heart healthy nutrition, utilization of the nutrition label, and utilization of this knowledge for meal planning. Follow up email sent as well. Written material given at graduation. Flowsheet Row Cardiac Rehab from 11/02/2022 in [facility] Cardiac and Pulmonary Rehab  Education need identified 10/05/22       Biometrics:  Pre Biometrics - 10/05/22 1135       Pre Biometrics   Height 5\' 9"  (1.753 m)    Weight 190 lb 14.4 oz (86.6 kg)    Waist Circumference 36 inches    Hip Circumference 40.5 inches    Waist to Hip Ratio 0.89 %    BMI (Calculated) 28.18    Single Leg Stand 30 seconds              Nutrition Therapy Plan and Nutrition Goals:  Nutrition Therapy & Goals - 10/05/22 1320       Nutrition Therapy   Diet Carb controlled, Cardiac, low Na    Protein (specify units) 90    Fiber 30 grams    Whole Grain Foods 3 servings    Saturated Fats 15 max. grams    Fruits and Vegetables 5 servings/day    Sodium 2 grams      Personal Nutrition Goals   Nutrition  Goal Watch carb portions and aim for ~30-60g per meal    Personal Goal #2 Drink 48oz of water per day    Personal Goal #3 Shop for good snack ideas to help be consistent with eating during busy work hours    Comments Patient drinking 32oz of water daily. He was drinking a lot of sugary beverages, but has cut back on them since finding out about his DM dx. He works busy hours and often eats out. His food choices are not great, often carb heavy and full of sodium. Brainstormed some changes he could make to improve his meals. Recommended keeping carbs choices to one at meals, watching portions and not drinking sugary beverages. Reviewed mediterranean diet handout, educated on types of fats, sources, and how to read labels. Built out several meals and snacks with foods he likes and will eat, focusing on reducing saturated fat, sodium and controlling carbs per meal with goal of ~30-60g.      Intervention Plan  Intervention Prescribe, educate and counsel regarding individualized specific dietary modifications aiming towards targeted core components such as weight, hypertension, lipid management, diabetes, heart failure and other comorbidities.;Nutrition handout(s) given to patient.    Expected Outcomes Short Term Goal: Understand basic principles of dietary content, such as calories, fat, sodium, cholesterol and nutrients.;Short Term Goal: A plan has been developed with personal nutrition goals set during dietitian appointment.;Long Term Goal: Adherence to prescribed nutrition plan.             Nutrition Assessments:  MEDIFICTS Score Key: >=70 Need to make dietary changes  40-70 Heart Healthy Diet <= 40 Therapeutic Level Cholesterol Diet  Flowsheet Row Cardiac Rehab from 10/05/2022 in [facility] Cardiac and Pulmonary Rehab  Picture Your Plate Total Score on Admission 39      Picture Your Plate Scores: <82 Unhealthy dietary pattern with much room for improvement. 41-50 Dietary pattern unlikely to  meet recommendations for good health and room for improvement. 51-60 More healthful dietary pattern, with some room for improvement.  >60 Healthy dietary pattern, although there may be some specific behaviors that could be improved.    Nutrition Goals Re-Evaluation:  Nutrition Goals Re-Evaluation     Row Name 10/17/22 0755             Goals   Comment He continues to check his BS, has been under 200 regularly which he is happy about. Spoke abut sodium in frozen meals. Saturated fat, and goal of less than ~15g per day. Overall, he reports he is doing better with nutrtion since meeting with RD       Expected Outcome Short: continue to check BS, keep carbs portions more controlled. Long: lower A1C, and build heart healthy eating habits.                Nutrition Goals Discharge (Final Nutrition Goals Re-Evaluation):  Nutrition Goals Re-Evaluation - 10/17/22 0755       Goals   Comment He continues to check his BS, has been under 200 regularly which he is happy about. Spoke abut sodium in frozen meals. Saturated fat, and goal of less than ~15g per day. Overall, he reports he is doing better with nutrtion since meeting with RD    Expected Outcome Short: continue to check BS, keep carbs portions more controlled. Long: lower A1C, and build heart healthy eating habits.             Psychosocial: Target Goals: Acknowledge presence or absence of significant depression and/or stress, maximize coping skills, provide positive support system. Participant is able to verbalize types and ability to use techniques and skills needed for reducing stress and depression.   Education: Stress, Anxiety, and Depression - Group verbal and visual presentation to define topics covered.  Reviews how body is impacted by stress, anxiety, and depression.  Also discusses healthy ways to reduce stress and to treat/manage anxiety and depression.  Written material given at graduation.   Education: Sleep  Hygiene -Provides group verbal and written instruction about how sleep can affect your health.  Define sleep hygiene, discuss sleep cycles and impact of sleep habits. Review good sleep hygiene tips.    Initial Review & Psychosocial Screening:  Initial Psych Review & Screening - 09/27/22 1325       Initial Review   Current issues with Current Sleep Concerns;Current Stress Concerns;Current Anxiety/Panic    Source of Stress Concerns Financial      Family Dynamics   Good Support System? Yes  Barriers   Psychosocial barriers to participate in program There are no identifiable barriers or psychosocial needs.;The patient should benefit from training in stress management and relaxation.      Screening Interventions   Interventions Encouraged to exercise;Provide feedback about the scores to participant;To provide support and resources with identified psychosocial needs    Expected Outcomes Long Term Goal: Stressors or current issues are controlled or eliminated.;Short Term goal: Utilizing psychosocial counselor, staff and physician to assist with identification of specific Stressors or current issues interfering with healing process. Setting desired goal for each stressor or current issue identified.;Short Term goal: Identification and review with participant of any Quality of Life or Depression concerns found by scoring the questionnaire.;Long Term goal: The participant improves quality of Life and PHQ9 Scores as seen by post scores and/or verbalization of changes             Quality of Life Scores:   Quality of Life - 10/05/22 1111       Quality of Life   Select Quality of Life      Quality of Life Scores   Health/Function Pre 16.3 %    Socioeconomic Pre 20 %    Psych/Spiritual Pre 15.43 %    Family Pre 13.7 %    GLOBAL Pre 16.6 %            Scores of 19 and below usually indicate a poorer quality of life in these areas.  A difference of  2-3 points is a clinically  meaningful difference.  A difference of 2-3 points in the total score of the Quality of Life Index has been associated with significant improvement in overall quality of life, self-image, physical symptoms, and general health in studies assessing change in quality of life.  PHQ-9: Review Flowsheet       10/20/2022 10/19/2022 10/05/2022 09/22/2022  Depression screen PHQ 2/9  Decreased Interest 0 0 0 0 0  Down, Depressed, Hopeless 0 0 1 2 0  PHQ - 2 Score 0 0 1 2 0  Altered sleeping 0 1 2 -  Tired, decreased energy 0 1 2 -  Change in appetite 0 1 3 -  Feeling bad or failure about yourself  0 0 0 -  Trouble concentrating 0 0 0 -  Moving slowly or fidgety/restless 0 0 3 -  Suicidal thoughts 0 0 0 -  PHQ-9 Score 0 4 12 -  Difficult doing work/chores Not difficult at all Not difficult at all Somewhat difficult -    Details       Multiple values from one day are sorted in reverse-chronological order        Interpretation of Total Score  Total Score Depression Severity:  1-4 = Minimal depression, 5-9 = Mild depression, 10-14 = Moderate depression, 15-19 = Moderately severe depression, 20-27 = Severe depression   Psychosocial Evaluation and Intervention:  Psychosocial Evaluation - 09/27/22 1335       Psychosocial Evaluation & Interventions   Interventions Encouraged to exercise with the program and follow exercise prescription;Stress management education;Relaxation education    Comments Aaron Bowers is 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 He was also newly diagnosed with diabetes. He admits it is taking time to get used to all the new medication and lifestyle changes he is having to make. He has met with a dietitian and is very interested in meeting with the program's dietitian to continue learning what he should be eating. He plans on returning to work  in November, as it is physically demanding so he had to wait until then. He lives by himself and states he has a support system,  but usually he is the one helping others and it is hard for him to reach out when he needs something. He has had issues with sleep for a long time, and recently noticed it has changed patterns. He is thinking this might be related to all the new medications. He has been shopping around for a blood pressure cuff and has found a glucometer he likes. He states he is very motivated to get started in the program because he wants to take care of his health.    Expected Outcomes Short: attend cardiac rehab for education and exercise. Long: develop and maintain positive self care habits    Continue Psychosocial Services  Follow up required by staff             Psychosocial Re-Evaluation:  Psychosocial Re-Evaluation     Row Name 10/19/22 [phone]             Psychosocial Re-Evaluation   Current issues with Current Stress Concerns;Current Psychotropic Meds       Comments Reviewed patient health questionnaire (PHQ-9) with patient for follow up. Previously, patients score indicated signs/symptoms of depression.  Reviewed to see if patient is improving symptom wise while in program.  Score improved and patient states that it is because he is feeling better since his heart event.       Expected Outcomes Short: Continue to attend [facility] regularly for regular exercise and social engagement. Long: Continue to improve symptoms and manage a positive mental state.       Interventions Encouraged to attend Cardiac Rehabilitation for the exercise       Continue Psychosocial Services  Follow up required by staff                Psychosocial Discharge (Final Psychosocial Re-Evaluation):  Psychosocial Re-Evaluation - 10/19/22 0811       Psychosocial Re-Evaluation   Current issues with Current Stress Concerns;Current Psychotropic Meds    Comments Reviewed patient health questionnaire (PHQ-9) with patient for follow up. Previously, patients score indicated signs/symptoms of depression.  Reviewed to see if  patient is improving symptom wise while in program.  Score improved and patient states that it is because he is feeling better since his heart event.    Expected Outcomes Short: Continue to attend [facility] regularly for regular exercise and social engagement. Long: Continue to improve symptoms and manage a positive mental state.    Interventions Encouraged to attend Cardiac Rehabilitation for the exercise    Continue Psychosocial Services  Follow up required by staff             Vocational Rehabilitation: Provide vocational rehab assistance to qualifying candidates.   Vocational Rehab Evaluation & Intervention:  Vocational Rehab - 09/27/22 1325       Initial Vocational Rehab Evaluation & Intervention   Assessment shows need for Vocational Rehabilitation No             Education: Education Goals: Education classes will be provided on a variety of topics geared toward better understanding of heart health and risk factor modification. Participant will state understanding/return demonstration of topics presented as noted by education test scores.  Learning Barriers/Preferences:  Learning Barriers/Preferences - 09/27/22 1324       Learning Barriers/Preferences   Learning Barriers None    Learning Preferences None  General Cardiac Education Topics:  AED/CPR: - Group verbal and written instruction with the use of models to demonstrate the basic use of the AED with the basic ABC's of resuscitation.   Anatomy and Cardiac Procedures: - Group verbal and visual presentation and models provide information about basic cardiac anatomy and function. Reviews the testing methods done to diagnose heart disease and the outcomes of the test results. Describes the treatment choices: Medical Management, Angioplasty, or Coronary Bypass Surgery for treating various heart conditions including Myocardial Infarction, Angina, Valve Disease, and Cardiac Arrhythmias.  Written  material given at graduation.   Medication Safety: - Group verbal and visual instruction to review commonly prescribed medications for heart and lung disease. Reviews the medication, class of the drug, and side effects. Includes the steps to properly store meds and maintain the prescription regimen.  Written material given at graduation.   Intimacy: - Group verbal instruction through game format to discuss how heart and lung disease can affect sexual intimacy. Written material given at graduation.. Flowsheet Row Cardiac Rehab from 11/02/2022 in [facility] Cardiac and Pulmonary Rehab  Date 11/02/22  Educator NT  Instruction Review Code 1- Verbalizes Understanding       Know Your Numbers and Heart Failure: - Group verbal and visual instruction to discuss disease risk factors for cardiac and pulmonary disease and treatment options.  Reviews associated critical values for Overweight/Obesity, Hypertension, Cholesterol, and Diabetes.  Discusses basics of heart failure: signs/symptoms and treatments.  Introduces Heart Failure Zone chart for action plan for heart failure.  Written material given at graduation. Flowsheet Row Cardiac Rehab from 11/02/2022 in [facility] Cardiac and Pulmonary Rehab  Education need identified 10/05/22       Infection Prevention: - Provides verbal and written material to individual with discussion of infection control including proper hand washing and proper equipment cleaning during exercise session. Flowsheet Row Cardiac Rehab from 11/02/2022 in [facility] Cardiac and Pulmonary Rehab  Date 10/05/22  Educator NT  Instruction Review Code 1- Verbalizes Understanding       Falls Prevention: - Provides verbal and written material to individual with discussion of falls prevention and safety. Flowsheet Row Cardiac Rehab from 11/02/2022 in [facility] Cardiac and Pulmonary Rehab  Date 10/05/22  Educator NT  Instruction Review Code 1- Verbalizes Understanding       Other: -Provides  group and verbal instruction on various topics (see comments)   Knowledge Questionnaire Score:  Knowledge Questionnaire Score - 10/05/22 1111       Knowledge Questionnaire Score   Pre Score 21/26             Core Components/Risk Factors/Patient Goals at Admission:  Personal Goals and Risk Factors at Admission - 09/27/22 1316       Core Components/Risk Factors/Patient Goals on Admission   Diabetes Yes    Intervention Provide education about signs/symptoms and action to take for hypo/hyperglycemia.;Provide education about proper nutrition, including hydration, and aerobic/resistive exercise prescription along with prescribed medications to achieve blood glucose in normal ranges: Fasting glucose 65-99 mg/dL    Expected Outcomes Short Term: Participant verbalizes understanding of the signs/symptoms and immediate care of hyper/hypoglycemia, proper foot care and importance of medication, aerobic/resistive exercise and nutrition plan for blood glucose control.;Long Term: Attainment of HbA1C < 7%.    Lipids Yes    Intervention Provide education and support for participant on nutrition & aerobic/resistive exercise along with prescribed medications to achieve LDL 70mg , HDL >40mg .    Expected Outcomes Short Term: Participant states understanding of  desired cholesterol values and is compliant with medications prescribed. Participant is following exercise prescription and nutrition guidelines.;Long Term: Cholesterol controlled with medications as prescribed, with individualized exercise RX and with personalized nutrition plan. Value goals: LDL < 70mg , HDL > 40 mg.             Education:Diabetes - Individual verbal and written instruction to review signs/symptoms of diabetes, desired ranges of glucose level fasting, after meals and with exercise. Acknowledge that pre and post exercise glucose checks will be done for 3 sessions at entry of program. Flowsheet Row Cardiac Rehab from 11/02/2022 in  [facility] Cardiac and Pulmonary Rehab  Date 09/27/22  Educator [facility]  Instruction Review Code 1- Verbalizes Understanding       Core Components/Risk Factors/Patient Goals Review:   Goals and Risk Factor Review     Row Name 10/19/22 0814             Core Components/Risk Factors/Patient Goals Review   Personal Goals Review Diabetes;Weight Management/Obesity       Review Aaron Bowers is wanting to maintain his weight. He is checking his sugars at home. His blood sugar has been below 200 routinely. He has no questions about any of his medications at this time.       Expected Outcomes Short: continue to monitor blood sugar levels. Long: maintain blood sugar levels independently.                Core Components/Risk Factors/Patient Goals at Discharge (Final Review):   Goals and Risk Factor Review - 10/19/22 0814       Core Components/Risk Factors/Patient Goals Review   Personal Goals Review Diabetes;Weight Management/Obesity    Review Aaron Bowers is wanting to maintain his weight. He is checking his sugars at home. His blood sugar has been below 200 routinely. He has no questions about any of his medications at this time.    Expected Outcomes Short: continue to monitor blood sugar levels. Long: maintain blood sugar levels independently.             ITP Comments:  ITP Comments     Row Name 09/27/22 1311 10/05/22 1105 10/07/22 0827 11/02/22 1038     ITP Comments Initial phone call completed. Diagnosis can be found in [facility] 9/10. EP Orientation scheduled for Wednesday 10/2 8am. Completed and gym orientation. Initial ITP created and sent for review to Dr. Bethann Punches, Medical Director. First full day of exercise!  Patient was oriented to gym and equipment including functions, settings, policies, and procedures.  Patient's individual exercise prescription and treatment plan were reviewed.  All starting workloads were established based on the results of the 6 minute walk test done at initial orientation  visit.  The plan for exercise progression was also introduced and progression will be customized based on patient's performance and goals. 30 Day review completed. Medical Director ITP review done, changes made as directed, and signed approval by Medical Director. new to program             Comments:

## 2022-11-02 NOTE — Progress Notes (Signed)
Daily Session Note  Patient Details  Name: Aaron Bowers MRN: [id number] Date of Birth: March 27, 1970 Referring Provider:   Flowsheet Row Cardiac Rehab from 10/05/2022 in [facility] Cardiac and Pulmonary Rehab  Referring Provider Dr. [name], MD       Encounter Date: 11/02/2022  Check In:  Session Check In - 11/02/22 0820       Check-In   Supervising physician immediately available to respond to emergencies See telemetry face sheet for immediately available ER MD    Location [facility]-Cardiac & Pulmonary Rehab    Staff Present Rory Percy, MS, Exercise Physiologist;Joseph Reino Kent, RCP,RRT,BSRT;Maxon [place] BS, , Exercise Physiologist;Everest Hacking Katrinka Blazing, RN, ADN    Virtual Visit No    Medication changes reported     No    Fall or balance concerns reported    No    Warm-up and Cool-down Performed on first and last piece of equipment    Resistance Training Performed Yes    VAD Patient? No    PAD/SET Patient? No      Pain Assessment   Currently in Pain? No/denies                Social History   Tobacco Use  Smoking Status Never   Passive exposure: Past  Smokeless Tobacco Never    Goals Met:  Independence with exercise equipment Exercise tolerated well No report of concerns or symptoms today Strength training completed today  Goals Unmet:  Not Applicable  Comments: Pt able to follow exercise prescription today without complaint.  Will continue to monitor for progression.    Dr. Bethann Punches is Medical Director for [facility] Cardiac Rehabilitation.  Dr. Vida Rigger is Medical Director for [facility] Pulmonary Rehabilitation.

## 2022-11-04 ENCOUNTER — Encounter: Payer: 59 | Admitting: *Deleted

## 2022-11-04 ENCOUNTER — Encounter: Payer: Self-pay | Admitting: Dietician

## 2022-11-04 ENCOUNTER — Encounter: Payer: 59 | Attending: Internal Medicine | Admitting: Dietician

## 2022-11-04 DIAGNOSIS — I214 Non-ST elevation (NSTEMI) myocardial infarction: Secondary | ICD-10-CM | POA: Insufficient documentation

## 2022-11-04 DIAGNOSIS — E119 Type 2 diabetes mellitus without complications: Secondary | ICD-10-CM | POA: Diagnosis present

## 2022-11-04 DIAGNOSIS — Z713 Dietary counseling and surveillance: Secondary | ICD-10-CM | POA: Diagnosis not present

## 2022-11-04 DIAGNOSIS — Z955 Presence of coronary angioplasty implant and graft: Secondary | ICD-10-CM | POA: Diagnosis present

## 2022-11-04 NOTE — Progress Notes (Signed)
Daily Session Note  Patient Details  Name: VINOD MIKESELL MRN: [id number] Date of Birth: 1970-11-29 Referring Provider:   Flowsheet Row Cardiac Rehab from 10/05/2022 in [facility] Cardiac and Pulmonary Rehab  Referring Provider Dr. [name], MD       Encounter Date: 11/04/2022  Check In:  Session Check In - 11/04/22 0737       Check-In   Supervising physician immediately available to respond to emergencies See telemetry face sheet for immediately available ER MD    Location [facility]-Cardiac & Pulmonary Rehab    Staff Present Ronette Deter, BS, Exercise Physiologist;Joseph Shelbie Proctor, RN, [place]    Virtual Visit No    Medication changes reported     No    Fall or balance concerns reported    No    Warm-up and Cool-down Performed on first and last piece of equipment    Resistance Training Performed Yes    VAD Patient? No    PAD/SET Patient? No      Pain Assessment   Currently in Pain? No/denies                Social History   Tobacco Use  Smoking Status Never   Passive exposure: Past  Smokeless Tobacco Never    Goals Met:  Independence with exercise equipment Exercise tolerated well No report of concerns or symptoms today Strength training completed today  Goals Unmet:  Not Applicable  Comments: Pt able to follow exercise prescription today without complaint.  Will continue to monitor for progression.    Dr. Bethann Punches is Medical Director for [facility] Cardiac Rehabilitation.  Dr. Vida Rigger is Medical Director for [facility] Pulmonary Rehabilitation.

## 2022-11-04 NOTE — Progress Notes (Signed)
Diabetes Self-Management Education  Visit Type: Follow-up  Appt. Start Time: 1000 Appt. End Time: 1040  11/04/2022  [name], identified by name and date of birth, is a 52 y.o. male with a diagnosis of Diabetes:  .   ASSESSMENT  There were no vitals taken for this visit. There is no height or weight on file to calculate BMI.  Pt reports starting with a new PCP in October, will be getting updated A1c in December. Pt reports they will be going back to work full time November 4th, states they are looking forward to it. Pt reports doing well at cardiac rehab, pushing themselves more and more, will be finishing rehab mid-late December. Pt reports also being more active at home. Pt reports continuing to drink less sugary beverages, trying more Lean Cuisine frozen dinners but feels it is not enough food, trying to eat lower fat foods as well.  Pt reports checking BG 3-4 times daily, FBG- ~130-140 Pt  Accu[employer]k app reports DMI of 7.3% for ~2 months   Diabetes Self-Management Education - 11/04/22 1127       Visit Information   Visit Type Follow-up      Pre-Education Assessment   Patient understands the diabetes disease and treatment process. Needs Review    Patient understands incorporating nutritional management into lifestyle. Needs Review    Patient undertands incorporating physical activity into lifestyle. Needs Review    Patient understands using medications safely. Demonstrates understanding / competency    Patient understands monitoring blood glucose, interpreting and using results Needs Review    Patient understands prevention, detection, and treatment of acute complications. Needs Review    Patient understands prevention, detection, and treatment of chronic complications. Needs Review    Patient understands how to develop strategies to address psychosocial issues. Needs Review    Patient understands how to develop strategies to promote health/change behavior. Needs  Review      Complications   How often do you check your blood sugar? 3-4 times/day    Fasting Blood glucose range (mg/dL) 161-096    Postprandial Blood glucose range (mg/dL) 045-409;811-914;>782   As high as 300   Number of hyperglycemic episodes ( >200mg /dL): Weekly    Can you tell when your blood sugar is high? No      Dietary Intake   Breakfast 2 [place] sandwiches on wheat, mayo    Lunch 2 [place] sandwiches on wheat, mayo    Dinner [employer], mayo, fries, sweet tea (PPBG - 258)    Beverage(s) Half sweet tea, water      Activity / Exercise   Activity / Exercise Type ADL's;Other (comment)   Cardiac Rehab   How many days per week do you exercise? 3    How many minutes per day do you exercise? 30    Total minutes per week of exercise 90      Individualized Goals (developed by patient)   Nutrition Follow meal plan discussed;General guidelines for healthy choices and portions discussed    Medications take my medication as prescribed    Monitoring  Test my blood glucose as discussed    Problem Solving Eating Pattern    Reducing Risk examine blood glucose patterns      Patient Self-Evaluation of Goals - Patient rates self as meeting previously set goals (% of time)   Nutrition 50 - 75 % (half of the time)    Physical Activity >75% (most of the time)   Cardiac Rehab   Medications 50 -  75 % (half of the time)    Monitoring >75% (most of the time)    Problem Solving and behavior change strategies  25 - 50% (sometimes)    Reducing Risk (treating acute and chronic complications) >75% (most of the time)    Health Coping 25 - 50% (sometimes)      Post-Education Assessment   Patient understands the diabetes disease and treatment process. Comprehends key points    Patient understands incorporating nutritional management into lifestyle. Needs Review    Patient undertands incorporating physical activity into lifestyle. Comprehends key points    Patient understands using medications  safely. Comphrehends key points    Patient understands monitoring blood glucose, interpreting and using results Comprehends key points    Patient understands prevention, detection, and treatment of acute complications. Needs Review   Hyperglycemia   Patient understands prevention, detection, and treatment of chronic complications. Comprehends key points    Patient understands how to develop strategies to address psychosocial issues. Needs Review    Patient understands how to develop strategies to promote health/change behavior. Needs Review      Outcomes   Expected Outcomes Demonstrated interest in learning but significant barriers to change    Future DMSE 2 months    Program Status Not Completed      Subsequent Visit   Since your last visit have you continued or begun to take your medications as prescribed? Yes    Since your last visit have you had your blood pressure checked? Yes    Is your most recent blood pressure lower, unchanged, or higher since your last visit? Lower    Since your last visit have you experienced any weight changes? Loss    Weight Loss (lbs) 2    Since your last visit, are you checking your blood glucose at least once a day? Yes             Individualized Plan for Diabetes Self-Management Training:   Learning Objective:  Patient will have a greater understanding of diabetes self-management. Patient education plan is to attend individual and/or group sessions per assessed needs and concerns.   Plan:   Patient Instructions  Work on recognizing high fat foods! Please continue to heavily moderate any FRIED foods, limit added butter, mayonnaise, American cheese, etc. Choose Miracle Whip in place of mayonnaise.  Try Danon Light n' Fit or Chobani (if you want it a little sweeter) [place] Yogurt for a breakfast item, or snack in the evening. Also consider keeping boiled eggs in the fridge as a snack as well.  Continue to choose ZERO sugar beverages!   Check your  blood sugar each morning before eating or drinking (fasting). Look for numbers  under 130 mg/dL Check your blood sugar 2 hours after you begin eating a meal. Look for numbers under 180 mg/dL at all times.  Your goal A1c is below 7.0%  If your blood sugar is over 200, drink a glass of water and take a short walk!  Expected Outcomes:  Demonstrated interest in learning but significant barriers to change  If problems or questions, patient to contact team via:  Phone and Email  Future DSME appointment: 2 months

## 2022-11-04 NOTE — Patient Instructions (Addendum)
Work on recognizing high fat foods! Please continue to heavily moderate any FRIED foods, limit added butter, mayonnaise, American cheese, etc. Choose Miracle Whip in place of mayonnaise.  Try Danon Light n' Fit or Chobani (if you want it a little sweeter) [place] Yogurt for a breakfast item, or snack in the evening. Also consider keeping boiled eggs in the fridge as a snack as well.  Continue to choose ZERO sugar beverages!   Check your blood sugar each morning before eating or drinking (fasting). Look for numbers  under 130 mg/dL Check your blood sugar 2 hours after you begin eating a meal. Look for numbers under 180 mg/dL at all times.  Your goal A1c is below 7.0%  If your blood sugar is over 200, drink a glass of water and take a short walk!

## 2022-11-07 DIAGNOSIS — I214 Non-ST elevation (NSTEMI) myocardial infarction: Secondary | ICD-10-CM

## 2022-11-07 DIAGNOSIS — Z713 Dietary counseling and surveillance: Secondary | ICD-10-CM | POA: Diagnosis not present

## 2022-11-07 DIAGNOSIS — Z955 Presence of coronary angioplasty implant and graft: Secondary | ICD-10-CM

## 2022-11-07 NOTE — Progress Notes (Signed)
Daily Session Note  Patient Details  Name: CLAUDIA ALVIZO MRN: [id number] Date of Birth: April 18, 1970 Referring Provider:   Flowsheet Row Cardiac Rehab from 10/05/2022 in [facility] Cardiac and Pulmonary Rehab  Referring Provider Dr. [name], MD       Encounter Date: 11/07/2022  Check In:  Session Check In - 11/07/22 0823       Check-In   Supervising physician immediately available to respond to emergencies See telemetry face sheet for immediately available ER MD    Location [facility]-Cardiac & Pulmonary Rehab    Staff Present Balinda Quails, RDN, LDN;Joseph Reino Kent, RCP,RRT,BSRT;Kelly Madilyn Fireman, BS, ACSM CEP, Exercise Physiologist    Virtual Visit No    Medication changes reported     No    Fall or balance concerns reported    No    Warm-up and Cool-down Performed on first and last piece of equipment    Resistance Training Performed Yes    VAD Patient? No    PAD/SET Patient? No      Pain Assessment   Currently in Pain? No/denies                Social History   Tobacco Use  Smoking Status Never   Passive exposure: Past  Smokeless Tobacco Never    Goals Met:  Independence with exercise equipment Exercise tolerated well No report of concerns or symptoms today  Goals Unmet:  Not Applicable  Comments: Pt able to follow exercise prescription today without complaint.  Will continue to monitor for progression.    Dr. Bethann Punches is Medical Director for [facility] Cardiac Rehabilitation.  Dr. Vida Rigger is Medical Director for [facility] Pulmonary Rehabilitation.

## 2022-11-09 DIAGNOSIS — I214 Non-ST elevation (NSTEMI) myocardial infarction: Secondary | ICD-10-CM

## 2022-11-09 DIAGNOSIS — Z713 Dietary counseling and surveillance: Secondary | ICD-10-CM | POA: Diagnosis not present

## 2022-11-09 DIAGNOSIS — Z955 Presence of coronary angioplasty implant and graft: Secondary | ICD-10-CM

## 2022-11-09 NOTE — Progress Notes (Signed)
Daily Session Note  Patient Details  Name: Aaron Bowers MRN: [id number] Date of Birth: 08-21-1970 Referring Provider:   Flowsheet Row Cardiac Rehab from 10/05/2022 in [facility] Cardiac and Pulmonary Rehab  Referring Provider Dr. [name], MD       Encounter Date: 11/09/2022  Check In:  Session Check In - 11/09/22 0843       Check-In   Supervising physician immediately available to respond to emergencies See telemetry face sheet for immediately available ER MD    Location [facility]-Cardiac & Pulmonary Rehab    Staff Present Ronette Deter, BS, Exercise Physiologist;Joseph [place], RCP,RRT,BSRT;Maxon Bryant BS, , Exercise Physiologist;Kaydynce Pat Katrinka Blazing, RN, ADN    Virtual Visit No    Medication changes reported     No    Fall or balance concerns reported    No    Warm-up and Cool-down Performed on first and last piece of equipment    Resistance Training Performed Yes    VAD Patient? No    PAD/SET Patient? No      Pain Assessment   Currently in Pain? No/denies                Social History   Tobacco Use  Smoking Status Never   Passive exposure: Past  Smokeless Tobacco Never    Goals Met:  Independence with exercise equipment Exercise tolerated well No report of concerns or symptoms today Strength training completed today  Goals Unmet:  Not Applicable  Comments: Pt able to follow exercise prescription today without complaint.  Will continue to monitor for progression.    Dr. Bethann Punches is Medical Director for [facility] Cardiac Rehabilitation.  Dr. Vida Rigger is Medical Director for [facility] Pulmonary Rehabilitation.

## 2022-11-11 ENCOUNTER — Encounter: Payer: 59 | Admitting: *Deleted

## 2022-11-11 DIAGNOSIS — I214 Non-ST elevation (NSTEMI) myocardial infarction: Secondary | ICD-10-CM

## 2022-11-11 DIAGNOSIS — Z713 Dietary counseling and surveillance: Secondary | ICD-10-CM | POA: Diagnosis not present

## 2022-11-11 DIAGNOSIS — Z955 Presence of coronary angioplasty implant and graft: Secondary | ICD-10-CM

## 2022-11-11 NOTE — Progress Notes (Signed)
Daily Session Note  Patient Details  Name: Aaron Bowers MRN: [id number] Date of Birth: 1970/05/28 Referring Provider:   Flowsheet Row Cardiac Rehab from 10/05/2022 in [facility] Cardiac and Pulmonary Rehab  Referring Provider Dr. [name], MD       Encounter Date: 11/11/2022  Check In:  Session Check In - 11/11/22 0753       Check-In   Supervising physician immediately available to respond to emergencies See telemetry face sheet for immediately available ER MD    Location [facility]-Cardiac & Pulmonary Rehab    Staff Present Cora Collum, RN, BSN, CCRP;Noah Tickle, BS, Exercise Physiologist;Joseph Teb[place], [place]    Virtual Visit No    Medication changes reported     No    Fall or balance concerns reported    No    Warm-up and Cool-down Performed on first and last piece of equipment    Resistance Training Performed Yes    VAD Patient? No    PAD/SET Patient? No      Pain Assessment   Currently in Pain? No/denies                Social History   Tobacco Use  Smoking Status Never   Passive exposure: Past  Smokeless Tobacco Never    Goals Met:  Independence with exercise equipment Exercise tolerated well No report of concerns or symptoms today  Goals Unmet:  Not Applicable  Comments: Pt able to follow exercise prescription today without complaint.  Will continue to monitor for progression.    Dr. Bethann Punches is Medical Director for [facility] Cardiac Rehabilitation.  Dr. Vida Rigger is Medical Director for [facility] Pulmonary Rehabilitation.

## 2022-11-14 ENCOUNTER — Encounter: Payer: 59 | Admitting: *Deleted

## 2022-11-14 DIAGNOSIS — I214 Non-ST elevation (NSTEMI) myocardial infarction: Secondary | ICD-10-CM

## 2022-11-14 DIAGNOSIS — Z955 Presence of coronary angioplasty implant and graft: Secondary | ICD-10-CM

## 2022-11-14 DIAGNOSIS — Z713 Dietary counseling and surveillance: Secondary | ICD-10-CM | POA: Diagnosis not present

## 2022-11-14 NOTE — Progress Notes (Signed)
Daily Session Note  Patient Details  Name: GARROD SCALZITTI MRN: [id number] Date of Birth: September 21, 1970 Referring Provider:   Flowsheet Row Cardiac Rehab from 10/05/2022 in [facility] Cardiac and Pulmonary Rehab  Referring Provider Dr. [name], MD       Encounter Date: 11/14/2022  Check In:  Session Check In - 11/14/22 0814       Check-In   Supervising physician immediately available to respond to emergencies See telemetry face sheet for immediately available ER MD    Location [facility]-Cardiac & Pulmonary Rehab    Staff Present Balinda Quails, RDN, LDN;Joseph Reino Kent, RCP,RRT,BSRT;Kelly Madilyn Fireman, BS, ACSM CEP, Exercise Physiologist;Barnaby Rippeon Katrinka Blazing, RN, ADN    Virtual Visit No    Medication changes reported     No    Fall or balance concerns reported    No    Warm-up and Cool-down Performed on first and last piece of equipment    Resistance Training Performed Yes    VAD Patient? No    PAD/SET Patient? No      Pain Assessment   Currently in Pain? No/denies                Social History   Tobacco Use  Smoking Status Never   Passive exposure: Past  Smokeless Tobacco Never    Goals Met:  Independence with exercise equipment Exercise tolerated well No report of concerns or symptoms today Strength training completed today  Goals Unmet:  Not Applicable  Comments: Pt able to follow exercise prescription today without complaint.  Will continue to monitor for progression.    Dr. Bethann Punches is Medical Director for [facility] Cardiac Rehabilitation.  Dr. Vida Rigger is Medical Director for [facility] Pulmonary Rehabilitation.

## 2022-11-16 ENCOUNTER — Encounter: Payer: 59 | Admitting: *Deleted

## 2022-11-16 DIAGNOSIS — Z713 Dietary counseling and surveillance: Secondary | ICD-10-CM | POA: Diagnosis not present

## 2022-11-16 DIAGNOSIS — Z955 Presence of coronary angioplasty implant and graft: Secondary | ICD-10-CM

## 2022-11-16 DIAGNOSIS — I214 Non-ST elevation (NSTEMI) myocardial infarction: Secondary | ICD-10-CM

## 2022-11-16 NOTE — Progress Notes (Signed)
Daily Session Note  Patient Details  Name: Aaron Bowers MRN: [id number] Date of Birth: [birth year] Referring Provider:   Flowsheet Row Cardiac Rehab from 10/05/2022 in [facility] Cardiac and Pulmonary Rehab  Referring Provider Dr. [name], MD       Encounter Date: 11/16/2022  Check In:  Session Check In - 11/16/22 0754       Check-In   Supervising physician immediately available to respond to emergencies See telemetry face sheet for immediately available ER MD    Location [facility]-Cardiac & Pulmonary Rehab    Staff Present Ronette Deter, BS, Exercise Physiologist;Joseph Reino Kent, Guinevere Ferrari, RN, ADN;Maxon [employer], , Exercise Physiologist    Virtual Visit No    Medication changes reported     No    Fall or balance concerns reported    No    Warm-up and Cool-down Performed on first and last piece of equipment    Resistance Training Performed Yes    VAD Patient? No    PAD/SET Patient? No      Pain Assessment   Currently in Pain? No/denies                Social History   Tobacco Use  Smoking Status Never   Passive exposure: Past  Smokeless Tobacco Never    Goals Met:  Independence with exercise equipment Exercise tolerated well No report of concerns or symptoms today Strength training completed today  Goals Unmet:  Not Applicable  Comments: Pt able to follow exercise prescription today without complaint.  Will continue to monitor for progression.    Dr. Bethann Punches is Medical Director for [facility] Cardiac Rehabilitation.  Dr. Vida Rigger is Medical Director for [facility] Pulmonary Rehabilitation.

## 2022-11-18 ENCOUNTER — Telehealth: Payer: Self-pay | Admitting: Pharmacy Technician

## 2022-11-18 ENCOUNTER — Encounter: Payer: Self-pay | Admitting: Emergency Medicine

## 2022-11-18 ENCOUNTER — Other Ambulatory Visit: Payer: Self-pay | Admitting: Emergency Medicine

## 2022-11-18 ENCOUNTER — Encounter: Payer: 59 | Admitting: *Deleted

## 2022-11-18 ENCOUNTER — Other Ambulatory Visit: Payer: Self-pay | Admitting: Physician Assistant

## 2022-11-18 ENCOUNTER — Ambulatory Visit: Payer: 59 | Attending: Physician Assistant | Admitting: Physician Assistant

## 2022-11-18 ENCOUNTER — Other Ambulatory Visit (HOSPITAL_COMMUNITY): Payer: Self-pay

## 2022-11-18 ENCOUNTER — Encounter: Payer: Self-pay | Admitting: Physician Assistant

## 2022-11-18 VITALS — BP 115/71 | HR 64 | Ht 68.0 in | Wt 191.0 lb

## 2022-11-18 DIAGNOSIS — I5020 Unspecified systolic (congestive) heart failure: Secondary | ICD-10-CM | POA: Diagnosis not present

## 2022-11-18 DIAGNOSIS — I2510 Atherosclerotic heart disease of native coronary artery without angina pectoris: Secondary | ICD-10-CM

## 2022-11-18 DIAGNOSIS — I255 Ischemic cardiomyopathy: Secondary | ICD-10-CM | POA: Diagnosis not present

## 2022-11-18 DIAGNOSIS — E785 Hyperlipidemia, unspecified: Secondary | ICD-10-CM

## 2022-11-18 DIAGNOSIS — I214 Non-ST elevation (NSTEMI) myocardial infarction: Secondary | ICD-10-CM | POA: Diagnosis not present

## 2022-11-18 DIAGNOSIS — E119 Type 2 diabetes mellitus without complications: Secondary | ICD-10-CM

## 2022-11-18 DIAGNOSIS — Z79899 Other long term (current) drug therapy: Secondary | ICD-10-CM

## 2022-11-18 DIAGNOSIS — Z955 Presence of coronary angioplasty implant and graft: Secondary | ICD-10-CM

## 2022-11-18 MED ORDER — DAPAGLIFLOZIN PROPANEDIOL 10 MG PO TABS: 10.0000 mg | ORAL_TABLET | Freq: Every day | ORAL | 3 refills | Status: DC

## 2022-11-18 MED ORDER — EMPAGLIFLOZIN 10 MG PO TABS: 10.0000 mg | ORAL_TABLET | Freq: Every day | ORAL | 3 refills | Status: DC

## 2022-11-18 NOTE — Progress Notes (Signed)
Daily Session Note  Patient Details  Name: GEORGI ORD MRN: [id number] Date of Birth: [birth year] Referring Provider:   Flowsheet Row Cardiac Rehab from 10/05/2022 in [facility] Cardiac and Pulmonary Rehab  Referring Provider Dr. [name], MD       Encounter Date: 11/18/2022  Check In:  Session Check In - 11/18/22 0814       Check-In   Supervising physician immediately available to respond to emergencies See telemetry face sheet for immediately available ER MD    Location [facility]-Cardiac & Pulmonary Rehab    Staff Present Cora Collum, RN, BSN, CCRP;Noah Tickle, BS, Exercise Physiologist;Joseph [place], [place]    Virtual Visit No    Medication changes reported     No    Fall or balance concerns reported    No    Warm-up and Cool-down Performed on first and last piece of equipment    Resistance Training Performed Yes    VAD Patient? No    PAD/SET Patient? No      Pain Assessment   Currently in Pain? No/denies                Social History   Tobacco Use  Smoking Status Never   Passive exposure: Past  Smokeless Tobacco Never    Goals Met:  Independence with exercise equipment Exercise tolerated well No report of concerns or symptoms today  Goals Unmet:  Not Applicable  Comments: Pt able to follow exercise prescription today without complaint.  Will continue to monitor for progression.    Dr. Bethann Punches is Medical Director for [facility] Cardiac Rehabilitation.  Dr. Vida Rigger is Medical Director for [facility] Pulmonary Rehabilitation.

## 2022-11-18 NOTE — Patient Instructions (Signed)
Medication Instructions:  Your physician recommends the following medication changes.  START TAKING: Farxiga 10 mg daily  *If you need a refill on your cardiac medications before your next appointment, please call your pharmacy*   Lab Work: Your provider would like for you to return in 1 week to have the following labs drawn: Lipid Panel, CMT, and A1C.   Please go to [facility] [address] ([facility] [address] [facility], [place] [address] You do not need an appointment.  They are open from 7:30 am-4 pm.  Lunch from 1:00 pm- 2:00 pm You DO need to be fasting.    If you have labs (blood work) drawn today and your tests are completely normal, you will receive your results only by: MyChart Message (if you have MyChart) OR A paper copy in the mail If you have any lab test that is abnormal or we need to change your treatment, we will call you to review the results.   Follow-Up: At [facility], you and your health needs are our priority.  As part of our continuing mission to provide you with exceptional heart care, we have created designated Provider Care Teams.  These Care Teams include your primary Cardiologist (physician) and Advanced Practice Providers (APPs -  Physician Assistants and Nurse Practitioners) who all work together to provide you with the care you need, when you need it.  We recommend signing up for the patient portal called "MyChart".  Sign up information is provided on this After Visit Summary.  MyChart is used to connect with patients for Virtual Visits (Telemedicine).  Patients are able to view lab/test results, encounter notes, upcoming appointments, etc.  Non-urgent messages can be sent to your provider as well.   To learn more about what you can do with MyChart, go to [email or web address].    Your next appointment:   1 month(s)  Provider:   You may see [name], MD or one of the following Advanced Practice Providers on your  designated Care Team:   Eula Listen, [place]

## 2022-11-18 NOTE — Progress Notes (Signed)
Med changed to facilitate availability to pt

## 2022-11-18 NOTE — Telephone Encounter (Signed)
Pharmacy Patient Advocate Encounter   Received notification from Patient Advice Request messages that prior authorization for farxiga is required/requested.   Insurance verification completed.   The patient is insured through [facility] .   Per test claim: PA required; PA submitted to above mentioned insurance via CoverMyMeds Key/confirmation #/[id number] Status is pending

## 2022-11-18 NOTE — Telephone Encounter (Signed)
Letter indicates Jardiance 10 mg is approved.  Please transition from [place] to Jardiance 10 mg daily.

## 2022-11-18 NOTE — Progress Notes (Signed)
Cardiology Office Note    Date:  11/18/2022   ID:  GARCIA CARRISALEZ, DOB Feb 14, 1970, MRN [id number]  PCP:  [name], FNP  Cardiologist:  [name], MD  Electrophysiologist:  None   Chief Complaint: Follow-up  History of Present Illness:   SAMAY BURRITT is a 52 y.o. male with history of CAD with NSTEMI status post PCI/DES to the mid LAD with PTCA to a jailed D2 in 09/2022, HFrEF secondary to ICM, HLD, recently diagnosed diabetes, and bipolar disorder who presents for follow-up of his CAD and cardiomyopathy.   He was admitted to the hospital from 9/10 through 09/15/2022 after having progressive intermittent exertional chest discomfort with strenuous activities over the preceding several months and was found to have an NSTEMI. High-sensitivity troponin [place] at [address].  LHC showed severe single-vessel CAD with 99% stenosis of the mid LAD involving the ostium of a moderate caliber D2 branch.  There was no significant disease noted in the dominant LCx or small RCA.  Moderately to severely reduced LV systolic function with an EF of 30 to 35% with normal filling pressure with an LVEDP of 10 mmHg.  He underwent successful PCI/DES to the mid LAD and angioplasty of jailed D2.  Final angiogram showed 0% residual stenosis in the LAD with 30% residual stenosis at the ostium of D2 with TIMI-3 flow throughout the left coronary artery.  Post-intervention echo showed an EF of 35 to 40% with wall motion abnormality involving the mid to distal anteroseptal, anterior, periapical, and mid to distal lateral walls with the basal regions best preserved, normal RV systolic function, ventricular cavity size, and RVSP, moderate mitral regurgitation, and an estimated right atrial pressure of 3 mmHg.  He was seen in hospital follow-up on 09/22/2022 and was without symptoms of angina or cardiac decompensation.  He did note a couple episodes of short-lived chest discomfort that would last for less than 1 minute  and spontaneously resolved.  Symptoms did not feel similar to his prior angina.  He was working with a Data processing manager.  He was adherent and tolerating cardiac medications.  He remained out of work given strenuous job function.  He was started on losartan 12.5 mg daily with continuation of Toprol-XL 12.5 mg and furosemide 20 mg every other day with KCl 10 mEq every other day.  He was last seen in the office on 10/21/2022 and remained without symptoms of angina or cardiac decompensation.  He reported a couple episodes of of chest discomfort that would last a few seconds and spontaneously resolved and did not feel similar to prior angina.  He also noted some positional dizziness.  He was eager to get back to work.  He was participating with cardiac rehab.  Symptoms of positional dizziness and relative hypotension precluded escalation of GDMT.  He comes in doing well from a cardiac perspective and is without symptoms of angina or cardiac decompensation.  He continues to note rare, randomly occurring, split-second lasting episodes of left-sided chest discomfort with spontaneous resolution that do not feel similar to his prior angina.  No lower extremity swelling, progressive dyspnea, orthopnea.  Now back to work at full duty.  No falls or symptoms concerning for bleeding.  Adherent to cardiac medications.  Participating with cardiac rehab.   Labs independently reviewed: 10/2022 - BUN 8, serum creatinine 0.87, potassium 4.5 09/2022 - TC 224, TG 264, HDL 31, LDL 140, A1c 7.1, LP(a) 96.2, Hgb 16.0, PLT 347, albumin 4.0, AST 49, ALT normal  Past Medical History:  Diagnosis Date   Bipolar disorder (HCC)    Borderline hyperlipidemia    CAD (coronary artery disease)    Diabetes mellitus (HCC)    Hyperlipemia     Past Surgical History:  Procedure Laterality Date   COLONOSCOPY WITH PROPOFOL N/A 05/13/2020   Procedure: COLONOSCOPY WITH PROPOFOL;  Surgeon: Toney Reil, MD;  Location: [facility] ENDOSCOPY;  Service:  Gastroenterology;  Laterality: N/A;   CORONARY STENT INTERVENTION N/A 09/13/2022   Procedure: CORONARY STENT INTERVENTION;  Surgeon: [name], MD;  Location: [facility] INVASIVE CV LAB;  Service: Cardiovascular;  Laterality: N/A;   ESOPHAGOGASTRODUODENOSCOPY (EGD) WITH PROPOFOL N/A 05/13/2020   Procedure: ESOPHAGOGASTRODUODENOSCOPY (EGD) WITH PROPOFOL;  Surgeon: Toney Reil, MD;  Location: [facility] ENDOSCOPY;  Service: Gastroenterology;  Laterality: N/A;   LEFT HEART CATH AND CORONARY ANGIOGRAPHY N/A 09/13/2022   Procedure: LEFT HEART CATH AND CORONARY ANGIOGRAPHY;  Surgeon: [name], MD;  Location: [facility] INVASIVE CV LAB;  Service: Cardiovascular;  Laterality: N/A;    Current Medications: Current Meds  Medication Sig   aspirin 81 MG chewable tablet Chew 1 tablet (81 mg total) by mouth daily.   atorvastatin (LIPITOR) 80 MG tablet Take 1 tablet (80 mg total) by mouth daily.   dapagliflozin propanediol (FARXIGA) 10 MG TABS tablet Take 1 tablet (10 mg total) by mouth daily before breakfast.   divalproex (DEPAKOTE ER) 500 MG 24 hr tablet Take 1 tablet (500 mg total) by mouth 3 (three) times daily.   ezetimibe (ZETIA) 10 MG tablet Take 1 tablet (10 mg total) by mouth daily.   furosemide (LASIX) 20 MG tablet Take 1 tablet (20 mg total) by mouth every other day.   lamoTRIgine (LAMICTAL) 150 MG tablet Take 150 mg by mouth 2 (two) times daily.   losartan (COZAAR) 25 MG tablet Take 0.5 tablets (12.5 mg total) by mouth daily.   metFORMIN (GLUCOPHAGE-XR) 500 MG 24 hr tablet Take 1 tablet (500 mg total) by mouth 2 (two) times daily with a meal.   metoprolol succinate (TOPROL-XL) 25 MG 24 hr tablet Take 0.5 tablets (12.5 mg total) by mouth at bedtime.   potassium chloride (KLOR-CON M) 10 MEQ tablet Take 1 tablet (10 mEq total) by mouth every other day. Take only when taking Lasix (furosemide).   prasugrel (EFFIENT) 10 MG TABS tablet Take 1 tablet (10 mg total) by mouth daily.    Allergies:    Patient has no known allergies.   Social History   Socioeconomic History   Marital status: Single    Spouse name: Not on file   Number of children: 3   Years of education: Not on file   Highest education level: 12th grade  Occupational History   Not on file  Tobacco Use   Smoking status: Never    Passive exposure: Past   Smokeless tobacco: Never  Vaping Use   Vaping status: Never Used  Substance and Sexual Activity   Alcohol use: Never   Drug use: Never   Sexual activity: Not Currently  Other Topics Concern   Not on file  Social History Narrative   Not on file   Social Determinants of Health   Financial Resource Strain: High Risk (10/16/2022)   Overall Financial Resource Strain (CARDIA)    Difficulty of Paying Living Expenses: Very hard  Food Insecurity: Food Insecurity Present (10/16/2022)   Hunger Vital Sign    Worried About Running Out of Food in the Last Year: Often true    Ran Out  of Food in the Last Year: Sometimes true  Transportation Needs: No Transportation Needs (10/16/2022)   PRAPARE - Administrator, Civil Service (Medical): No    Lack of Transportation (Non-Medical): No  Physical Activity: Sufficiently Active (10/16/2022)   Exercise Vital Sign    Days of Exercise per Week: 5 days    Minutes of Exercise per Session: 90 min  Stress: Stress Concern Present (10/16/2022)   [employer] of Occupational Health - Occupational Stress Questionnaire    Feeling of Stress : To some extent  Social Connections: Moderately Integrated (10/16/2022)   Social Connection and Isolation Panel [NHANES]    Frequency of Communication with Friends and Family: Twice a week    Frequency of Social Gatherings with Friends and Family: Twice a week    Attends Religious Services: 1 to 4 times per year    Active Member of [employer] or Organizations: Yes    Attends Engineer, structural: More than 4 times per year    Marital Status: Separated     Family History:   The patient's family history includes Asthma in his father; Deep vein thrombosis in his mother; Heart disease in his mother; Parkinson's disease in his father.  ROS:   12-point review of systems is negative unless otherwise noted in the HPI.   EKGs/Labs/Other Studies Reviewed:    Studies reviewed were summarized above. The additional studies were reviewed today:  2D echo 09/14/2022: 1. Left ventricular ejection fraction, by estimation, is 35 to 40%. Left  ventricular ejection fraction by 3D volume is 45 %. The left ventricle has  moderately decreased function. The left ventricle demonstrates regional  wall motion abnormalities (mid to   distal anteroroseptal, anterior, periapical, mid to distal lateral walls). Basal regions best preserved. Left ventricular diastolic parameters are  indeterminate. The average left ventricular global longitudinal strain is  -6.7 %. The global longitudinal strain is abnormal.   2. Right ventricular systolic function is normal. The right ventricular  size is normal. There is normal pulmonary artery systolic pressure. The  estimated right ventricular systolic pressure is 32.8 mmHg.   3. The mitral valve is normal in structure. Moderate mitral valve  regurgitation. No evidence of mitral stenosis.   4. The aortic valve is tricuspid. Aortic valve regurgitation is not  visualized. No aortic stenosis is present.   5. The inferior vena cava is normal in size with greater than 50%  respiratory variability, suggesting right atrial pressure of 3 mmHg.  __________   LHC 09/13/2022: Conclusions: Severe single-vessel coronary artery disease with 99% stenosis of mid LAD involving ostium of moderate-caliber D2 branch.  No significant disease noted in dominant LCx or small RCA. Moderately to severely reduced left ventricular systolic function (LVEF 30-35%) with normal filling pressure (LVEDP 10 mmHg). Successful PCI to mid LAD using Onyx Frontier 2.75 x 38 mm  drug-eluting stent (postdilated to 3.1 mm) and angioplasty of jailed D2 branch using 2.0 mm balloon.  Final angiogram shows 0% residual stenosis in the LAD and 30% residual stenosis at the ostium of D2.  There is TIMI-3 flow throughout the left coronary artery.   Recommendations: Continue tirofiban infusion for 4 hours. Dual antiplatelet therapy with aspirin and prasugrel for at least 12 months. Aggressive secondary prevention of coronary artery disease. Obtain echocardiogram.  If LVEF confirmed to be less than 35%, consider LifeVest at discharge.   EKG:  EKG is ordered today.  The EKG ordered today demonstrates sinus rhythm with sinus arrhythmia, 64  bpm, improving anterolateral TWI  Recent Labs: 09/13/2022: ALT 22 09/29/2022: Hemoglobin 16.0; Platelets 347 10/10/2022: BUN 8; Creatinine, Ser 0.87; Potassium 4.5; Sodium 140  Recent Lipid Panel    Component Value Date/Time   CHOL 224 (H) 09/14/2022 0247   TRIG 264 (H) 09/14/2022 0247   HDL 31 (L) 09/14/2022 0247   CHOLHDL 7.2 09/14/2022 0247   VLDL 53 (H) 09/14/2022 0247   LDLCALC 140 (H) 09/14/2022 0247    PHYSICAL EXAM:    VS:  BP 115/71 (BP Location: Left Arm, Patient Position: Sitting, Cuff Size: Normal)   Pulse 64   Ht 5\' 8"  (1.727 m)   Wt 191 lb (86.6 kg)   SpO2 97%   BMI 29.04 kg/m   BMI: Body mass index is 29.04 kg/m.  Physical Exam Vitals reviewed.  Constitutional:      Appearance: He is well-developed.  HENT:     Head: Normocephalic and atraumatic.  Eyes:     General:        Right eye: No discharge.        Left eye: No discharge.  Neck:     Vascular: No JVD.  Cardiovascular:     Rate and Rhythm: Normal rate and regular rhythm.     Pulses:          Posterior tibial pulses are 2+ on the right side and 2+ on the left side.     Heart sounds: Normal heart sounds, S1 normal and S2 normal. Heart sounds not distant. No midsystolic click and no opening snap. No murmur heard.    No friction rub.  Pulmonary:      Effort: Pulmonary effort is normal. No respiratory distress.     Breath sounds: Normal breath sounds. No decreased breath sounds, wheezing, rhonchi or rales.  Chest:     Chest wall: No tenderness.  Abdominal:     General: There is no distension.  Musculoskeletal:     Cervical back: Normal range of motion.     Right lower leg: No edema.     Left lower leg: No edema.  Skin:    General: Skin is warm and dry.     Nails: There is no clubbing.  Neurological:     Mental Status: He is alert and oriented to person, place, and time.  Psychiatric:        Speech: Speech normal.        Behavior: Behavior normal.        Thought Content: Thought content normal.        Judgment: Judgment normal.     Wt Readings from Last 3 Encounters:  11/18/22 191 lb (86.6 kg)  10/21/22 189 lb (85.7 kg)  10/20/22 194 lb 4.8 oz (88.1 kg)     ASSESSMENT & PLAN:   CAD involving the native coronary arteries with high risk NSTEMI without angina: He continues to do well and is without symptoms of angina or cardiac decompensation.  Continue DAPT with aspirin 81 mg and Effient 10 mg daily without interruption for a minimum of 12 months dating back to date of PCI (09/13/2022).  Continue aggressive risk factor modification and secondary prevention including atorvastatin, ezetimibe, and Toprol-XL.  Continue to participate with cardiac rehab.  Cleared to return to work at full duty on 11/07/2022.  No falls or symptoms concerning for bleeding on DAPT.  No indication for further ischemic testing at this time.  HFrEF secondary to ICM: He is euvolemic and well compensated with NYHA class I symptoms.  Continue current GDMT including losartan 12.5 mg, Toprol-XL 12.5 mg, and furosemide 20 mg every other day.  Add Farxiga 10 mg.  Relative hypotension precludes transition of ARB to ARNI.  Consider addition of MRA moving forward.  Follow-up labs 1 week after addition of SGLT2 inhibitor.  No indication for LifeVest with EF of 40%.   Follow-up echo next month to reevaluate cardiomyopathy following PCI and optimization of GDMT.  HLD: LDL 140 with target LDL less than 55.  Future orders placed for fasting lipid panel and LFT.  Remains on atorvastatin 10 mg.  Escalate lipid-lowering therapy as indicated to achieve target LDL.  Future orders for lipid panel and LFT have been placed, to be drawn next week.  Newly diagnosed diabetes: A1c 7.1 during recent admission.  Adding Farxiga 10 mg as outlined above.  Remains on metformin 500 mg twice daily.  Check A1c.  Ongoing management per PCP.    Disposition: F/u with Dr. Okey Dupre or an APP in 1 month.   Medication Adjustments/Labs and Tests Ordered: Current medicines are reviewed at length with the patient today.  Concerns regarding medicines are outlined above. Medication changes, Labs and Tests ordered today are summarized above and listed in the Patient Instructions accessible in Encounters.   Signed, Eula Listen, PA-C 11/18/2022 1:08 PM     [facility] [place] [address] [place], [place] [address] [phone]

## 2022-11-18 NOTE — Telephone Encounter (Signed)
Pharmacy Patient Advocate Encounter  Received notification from [facility] that Prior Authorization for Aaron Bowers has been DENIED.  See denial reason below. No denial letter attached in CMM. Will attach denial letter to Media tab once received.   PA #/Case ID/Reference #: [id number]

## 2022-11-21 ENCOUNTER — Other Ambulatory Visit (HOSPITAL_COMMUNITY): Payer: Self-pay

## 2022-11-21 ENCOUNTER — Encounter: Payer: 59 | Admitting: *Deleted

## 2022-11-21 DIAGNOSIS — I214 Non-ST elevation (NSTEMI) myocardial infarction: Secondary | ICD-10-CM

## 2022-11-21 DIAGNOSIS — Z955 Presence of coronary angioplasty implant and graft: Secondary | ICD-10-CM

## 2022-11-21 DIAGNOSIS — Z713 Dietary counseling and surveillance: Secondary | ICD-10-CM | POA: Diagnosis not present

## 2022-11-21 NOTE — Progress Notes (Signed)
Daily Session Note  Patient Details  Name: Aaron Bowers MRN: [id number] Date of Birth: [birth year] Referring Provider:   Flowsheet Row Cardiac Rehab from 10/05/2022 in [facility] Cardiac and Pulmonary Rehab  Referring Provider Dr. [name], MD       Encounter Date: 11/21/2022  Check In:  Session Check In - 11/21/22 0810       Check-In   Supervising physician immediately available to respond to emergencies See telemetry face sheet for immediately available ER MD    Location [facility]-Cardiac & Pulmonary Rehab    Staff Present Balinda Quails, RDN, LDN;Joseph Reino Kent, RCP,RRT,BSRT;Kelly Madilyn Fireman, BS, ACSM CEP, Exercise Physiologist;Vada Swift Katrinka Blazing, RN, ADN    Virtual Visit No    Medication changes reported     No    Fall or balance concerns reported    No    Warm-up and Cool-down Performed on first and last piece of equipment    Resistance Training Performed Yes    VAD Patient? No    PAD/SET Patient? No      Pain Assessment   Currently in Pain? No/denies                Social History   Tobacco Use  Smoking Status Never   Passive exposure: Past  Smokeless Tobacco Never    Goals Met:  Independence with exercise equipment Exercise tolerated well No report of concerns or symptoms today Strength training completed today  Goals Unmet:  Not Applicable  Comments: Pt able to follow exercise prescription today without complaint.  Will continue to monitor for progression.    Dr. Bethann Punches is Medical Director for [facility] Cardiac Rehabilitation.  Dr. Vida Rigger is Medical Director for [facility] Pulmonary Rehabilitation.

## 2022-11-23 ENCOUNTER — Encounter: Payer: Self-pay | Admitting: *Deleted

## 2022-11-23 ENCOUNTER — Encounter: Payer: 59 | Admitting: *Deleted

## 2022-11-23 DIAGNOSIS — Z713 Dietary counseling and surveillance: Secondary | ICD-10-CM | POA: Diagnosis not present

## 2022-11-23 DIAGNOSIS — Z955 Presence of coronary angioplasty implant and graft: Secondary | ICD-10-CM

## 2022-11-23 DIAGNOSIS — I214 Non-ST elevation (NSTEMI) myocardial infarction: Secondary | ICD-10-CM

## 2022-11-23 NOTE — Progress Notes (Signed)
Daily Session Note  Patient Details  Name: BOYDEN BRANOM MRN: [id number] Date of Birth: 04/05/1970 Referring Provider:   Flowsheet Row Cardiac Rehab from 10/05/2022 in [facility] Cardiac and Pulmonary Rehab  Referring Provider Dr. [name], MD       Encounter Date: 11/23/2022  Check In:  Session Check In - 11/23/22 0849       Check-In   Supervising physician immediately available to respond to emergencies See telemetry face sheet for immediately available ER MD    Location [facility]-Cardiac & Pulmonary Rehab    Staff Present Elige Ko, RCP,RRT,BSRT;Margaret Best, MS, Exercise Physiologist;Laneah Luft Katrinka Blazing, RN, ADN;Maxon Conetta BS, , Exercise Physiologist    Virtual Visit No    Medication changes reported     No    Fall or balance concerns reported    No    Warm-up and Cool-down Performed on first and last piece of equipment    Resistance Training Performed Yes    VAD Patient? No    PAD/SET Patient? No      Pain Assessment   Currently in Pain? No/denies                Social History   Tobacco Use  Smoking Status Never   Passive exposure: Past  Smokeless Tobacco Never    Goals Met:  Independence with exercise equipment Exercise tolerated well No report of concerns or symptoms today Strength training completed today  Goals Unmet:  Not Applicable  Comments: Pt able to follow exercise prescription today without complaint.  Will continue to monitor for progression.    Dr. Bethann Punches is Medical Director for [facility] Cardiac Rehabilitation.  Dr. Vida Rigger is Medical Director for [facility] Pulmonary Rehabilitation.

## 2022-11-23 NOTE — Progress Notes (Signed)
Cardiac Individual Treatment Plan  Patient Details  Name: FARAAZ STEC MRN: [id number] Date of Birth: 10-Apr-1970 Referring Provider:   Flowsheet Row Cardiac Rehab from 10/05/2022 in [facility] Cardiac and Pulmonary Rehab  Referring Provider Dr. [name], MD       Initial Encounter Date:  Flowsheet Row Cardiac Rehab from 10/05/2022 in [facility] Cardiac and Pulmonary Rehab  Date 10/05/22       Visit Diagnosis: NSTEMI (non-ST elevated myocardial infarction) [facility])  Status post coronary artery stent placement  Patient's Home Medications on Admission:  Current Outpatient Medications:    aspirin 81 MG chewable tablet, Chew 1 tablet (81 mg total) by mouth daily., Disp: 30 tablet, Rfl: 3   atorvastatin (LIPITOR) 80 MG tablet, Take 1 tablet (80 mg total) by mouth daily., Disp: 30 tablet, Rfl: 3   divalproex (DEPAKOTE ER) 500 MG 24 hr tablet, Take 1 tablet (500 mg total) by mouth 3 (three) times daily., Disp: 270 tablet, Rfl: 1   empagliflozin (JARDIANCE) 10 MG TABS tablet, Take 1 tablet (10 mg total) by mouth daily before breakfast., Disp: 90 tablet, Rfl: 3   ezetimibe (ZETIA) 10 MG tablet, Take 1 tablet (10 mg total) by mouth daily., Disp: 30 tablet, Rfl: 3   furosemide (LASIX) 20 MG tablet, Take 1 tablet (20 mg total) by mouth every other day., Disp: 90 tablet, Rfl: 3   lamoTRIgine (LAMICTAL) 150 MG tablet, Take 150 mg by mouth 2 (two) times daily., Disp: , Rfl:    losartan (COZAAR) 25 MG tablet, Take 0.5 tablets (12.5 mg total) by mouth daily., Disp: 90 tablet, Rfl: 3   metFORMIN (GLUCOPHAGE-XR) 500 MG 24 hr tablet, Take 1 tablet (500 mg total) by mouth 2 (two) times daily with a meal., Disp: 60 tablet, Rfl: 3   metoprolol succinate (TOPROL-XL) 25 MG 24 hr tablet, Take 0.5 tablets (12.5 mg total) by mouth at bedtime., Disp: 15 tablet, Rfl: 3   potassium chloride (KLOR-CON M) 10 MEQ tablet, Take 1 tablet (10 mEq total) by mouth every other day. Take only when taking Lasix (furosemide).,  Disp: 90 tablet, Rfl: 3   prasugrel (EFFIENT) 10 MG TABS tablet, Take 1 tablet (10 mg total) by mouth daily., Disp: 30 tablet, Rfl: 3  Past Medical History: Past Medical History:  Diagnosis Date   Bipolar disorder (HCC)    Borderline hyperlipidemia    CAD (coronary artery disease)    Diabetes mellitus (HCC)    Hyperlipemia     Tobacco Use: Social History   Tobacco Use  Smoking Status Never   Passive exposure: Past  Smokeless Tobacco Never    Labs: Review Flowsheet       Latest Ref Rng & Units 09/14/2022  Labs for ITP Cardiac and Pulmonary Rehab  Cholestrol 0 - 200 mg/dL 657   LDL (calc) 0 - 99 mg/dL 846   HDL-C >96 mg/dL 31   Trlycerides <295 mg/dL 284   Hemoglobin X3K 4.8 - 5.6 % 7.1     Details             Exercise Target Goals: Exercise Program Goal: Individual exercise prescription set using results from initial 6 min walk test and THRR while considering  patient's activity barriers and safety.   Exercise Prescription Goal: Initial exercise prescription builds to 30-45 minutes a day of aerobic activity, 2-3 days per week.  Home exercise guidelines will be given to patient during program as part of exercise prescription that the participant will acknowledge.   Education: Aerobic  Exercise: - Group verbal and visual presentation on the components of exercise prescription. Introduces F.I.T.T principle from ACSM for exercise prescriptions.  Reviews F.I.T.T. principles of aerobic exercise including progression. Written material given at graduation. Flowsheet Row Cardiac Rehab from 11/02/2022 in [facility] Cardiac and Pulmonary Rehab  Education need identified 10/05/22  Date 11/02/22  Educator NT  Instruction Review Code 1- Verbalizes Understanding       Education: Resistance Exercise: - Group verbal and visual presentation on the components of exercise prescription. Introduces F.I.T.T principle from ACSM for exercise prescriptions  Reviews F.I.T.T. principles of  resistance exercise including progression. Written material given at graduation. Flowsheet Row Cardiac Rehab from 11/02/2022 in [facility] Cardiac and Pulmonary Rehab  Date 10/26/22  Educator NT  Instruction Review Code 1- Verbalizes Understanding        Education: Exercise & Equipment Safety: - Individual verbal instruction and demonstration of equipment use and safety with use of the equipment. Flowsheet Row Cardiac Rehab from 11/02/2022 in [facility] Cardiac and Pulmonary Rehab  Date 10/05/22  Educator NT  Instruction Review Code 1- Verbalizes Understanding       Education: Exercise Physiology & General Exercise Guidelines: - Group verbal and written instruction with models to review the exercise physiology of the cardiovascular system and associated critical values. Provides general exercise guidelines with specific guidelines to those with heart or lung disease.  Flowsheet Row Cardiac Rehab from 11/02/2022 in [facility] Cardiac and Pulmonary Rehab  Date 10/19/22  Educator NT  Instruction Review Code 1- [employer] Understanding       Education: Flexibility, Balance, Mind/Body Relaxation: - Group verbal and visual presentation with interactive activity on the components of exercise prescription. Introduces F.I.T.T principle from ACSM for exercise prescriptions. Reviews F.I.T.T. principles of flexibility and balance exercise training including progression. Also discusses the mind body connection.  Reviews various relaxation techniques to help reduce and manage stress (i.e. Deep breathing, progressive muscle relaxation, and visualization). Balance handout provided to take home. Written material given at graduation. Flowsheet Row Cardiac Rehab from 11/02/2022 in [facility] Cardiac and Pulmonary Rehab  Date 10/26/22  Educator NT  Instruction Review Code 1- Verbalizes Understanding       Activity Barriers & Risk Stratification:  Activity Barriers & Cardiac Risk Stratification - 10/05/22 1134        Activity Barriers & Cardiac Risk Stratification   Activity Barriers Neck/Spine Problems   bulging disc   Cardiac Risk Stratification Moderate             6 Minute Walk:  6 Minute Walk     Row Name 10/05/22 1132         6 Minute Walk   Phase Initial     Distance 1340 feet     Walk Time 6 minutes     # of Rest Breaks 0     MPH 2.54     METS 3.92     RPE 7     Perceived Dyspnea  0     VO2 Peak 13.71     Symptoms No     Resting HR 62 bpm     Resting BP 118/68     Resting Oxygen Saturation  98 %     Exercise Oxygen Saturation  during 6 min walk 99 %     Max Ex. HR 94 bpm     Max Ex. BP 136/72     2 Minute Post BP 128/68  Oxygen Initial Assessment:   Oxygen Re-Evaluation:   Oxygen Discharge (Final Oxygen Re-Evaluation):   Initial Exercise Prescription:  Initial Exercise Prescription - 10/05/22 1100       Date of Initial Exercise RX and Referring Provider   Date 10/05/22    Referring Provider Dr. [name], MD      Oxygen   Maintain Oxygen Saturation 88% or higher      Treadmill   MPH 2.2    Grade 1    Minutes 15    METs 2.99      NuStep   Level 3    SPM 80    Minutes 15    METs 3.92      REL-XR   Level 3    Speed 50    Minutes 15    METs 3.92      Prescription Details   Frequency (times per week) 3    Duration Progress to 30 minutes of continuous aerobic without signs/symptoms of physical distress      Intensity   THRR 40-80% of Max Heartrate 104-146    Ratings of Perceived Exertion 11-13    Perceived Dyspnea 0-4      Progression   Progression Continue to progress workloads to maintain intensity without signs/symptoms of physical distress.      Resistance Training   Training Prescription Yes    Weight 5 lb    Reps 10-15             Perform Capillary Blood Glucose checks as needed.  Exercise Prescription Changes:   Exercise Prescription Changes     Row Name 10/05/22 1100 10/13/22 1300 10/26/22 0700  11/10/22 1300 11/22/22 1400     Response to Exercise   Blood Pressure (Admit) 118/68 128/70 114/66 120/62 116/58   Blood Pressure (Exercise) 136/72 130/62 140/82 128/58 --   Blood Pressure (Exit) 128/68 108/60 122/64 108/60 100/56   Heart Rate (Admit) 62 bpm 82 bpm 80 bpm 75 bpm 70 bpm   Heart Rate (Exercise) 94 bpm 98 bpm 135 bpm 148 bpm 137 bpm   Heart Rate (Exit) 66 bpm 72 bpm 90 bpm 88 bpm 77 bpm   Oxygen Saturation (Admit) 98 % -- -- -- --   Oxygen Saturation (Exercise) 99 % -- -- -- --   Rating of Perceived Exertion (Exercise) 7 11 15 17 16    Perceived Dyspnea (Exercise) 0 -- -- -- --   Symptoms None none none none none   Comments Results First full day of exercise -- -- --   Duration -- Continue with 30 min of aerobic exercise without signs/symptoms of physical distress. Continue with 30 min of aerobic exercise without signs/symptoms of physical distress. Continue with 30 min of aerobic exercise without signs/symptoms of physical distress. Continue with 30 min of aerobic exercise without signs/symptoms of physical distress.   Intensity -- THRR unchanged THRR unchanged THRR unchanged THRR unchanged     Progression   Progression -- Continue to progress workloads to maintain intensity without signs/symptoms of physical distress. Continue to progress workloads to maintain intensity without signs/symptoms of physical distress. Continue to progress workloads to maintain intensity without signs/symptoms of physical distress. Continue to progress workloads to maintain intensity without signs/symptoms of physical distress.   Average METs -- 3.4 5.84 5.95 6.35     Resistance Training   Training Prescription -- Yes Yes Yes Yes   Weight -- 5 lb 5 lb 5 lb 5 lb   Reps -- 10-15 10-15 10-15  10-15     Interval Training   Interval Training -- No No No No     Treadmill   MPH -- 2.2 2.9 3.2 3   Grade -- 2 8 9 9    Minutes -- 15 15 15 15    METs -- 3.29 6.42 7.42 7.02     NuStep   Level --  -- 6 5 6    Minutes -- -- 15 15 15    METs -- -- 6.5 8.7 8.3     Elliptical   Level -- -- -- 1 6   Speed -- -- -- 3 3   Minutes -- -- -- 15 15   METs -- -- -- 7 3.4     REL-XR   Level -- -- 7 -- --   Minutes -- -- 15 -- --   METs -- -- 6.4 -- --     Biostep-RELP   Level -- 4 -- -- --   Minutes -- 15 -- -- --   METs -- 3.5 -- -- --     Oxygen   Maintain Oxygen Saturation -- 88% or higher 88% or higher 88% or higher 88% or higher            Exercise Comments:   Exercise Comments     Row Name 10/07/22 0827           Exercise Comments First full day of exercise!  Patient was oriented to gym and equipment including functions, settings, policies, and procedures.  Patient's individual exercise prescription and treatment plan were reviewed.  All starting workloads were established based on the results of the 6 minute walk test done at initial orientation visit.  The plan for exercise progression was also introduced and progression will be customized based on patient's performance and goals.                Exercise Goals and Review:   Exercise Goals     Row Name 10/05/22 1134             Exercise Goals   Increase Physical Activity Yes       Intervention Provide advice, education, support and counseling about physical activity/exercise needs.;Develop an individualized exercise prescription for aerobic and resistive training based on initial evaluation findings, risk stratification, comorbidities and participant's personal goals.       Expected Outcomes Short Term: Attend rehab on a regular basis to increase amount of physical activity.;Long Term: Add in home exercise to make exercise part of routine and to increase amount of physical activity.;Long Term: Exercising regularly at least 3-5 days a week.       Increase Strength and Stamina Yes       Intervention Provide advice, education, support and counseling about physical activity/exercise needs.;Develop an  individualized exercise prescription for aerobic and resistive training based on initial evaluation findings, risk stratification, comorbidities and participant's personal goals.       Expected Outcomes Short Term: Increase workloads from initial exercise prescription for resistance, speed, and METs.;Short Term: Perform resistance training exercises routinely during rehab and add in resistance training at home;Long Term: Improve cardiorespiratory fitness, muscular endurance and strength as measured by increased METs and functional capacity ( )       Able to understand and use rate of perceived exertion (RPE) scale Yes       Intervention Provide education and explanation on how to use RPE scale       Expected Outcomes Short Term: Able to use RPE daily in  rehab to express subjective intensity level;Long Term:  Able to use RPE to guide intensity level when exercising independently       Able to understand and use Dyspnea scale Yes       Intervention Provide education and explanation on how to use Dyspnea scale       Expected Outcomes Short Term: Able to use Dyspnea scale daily in rehab to express subjective sense of shortness of breath during exertion;Long Term: Able to use Dyspnea scale to guide intensity level when exercising independently       Knowledge and understanding of Target Heart Rate Range (THRR) Yes       Intervention Provide education and explanation of THRR including how the numbers were predicted and where they are located for reference       Expected Outcomes Short Term: Able to state/look up THRR;Long Term: Able to use THRR to govern intensity when exercising independently;Short Term: Able to use daily as guideline for intensity in rehab       Able to check pulse independently Yes       Intervention Provide education and demonstration on how to check pulse in carotid and radial arteries.;Review the importance of being able to check your own pulse for safety during independent exercise        Expected Outcomes Short Term: Able to explain why pulse checking is important during independent exercise;Long Term: Able to check pulse independently and accurately       Understanding of Exercise Prescription Yes       Intervention Provide education, explanation, and written materials on patient's individual exercise prescription       Expected Outcomes Short Term: Able to explain program exercise prescription;Long Term: Able to explain home exercise prescription to exercise independently                Exercise Goals Re-Evaluation :  Exercise Goals Re-Evaluation     Row Name 10/07/22 0828 10/13/22 1346 10/26/22 0746 11/10/22 1312 11/14/22 0743     Exercise Goal Re-Evaluation   Exercise Goals Review Able to understand and use rate of perceived exertion (RPE) scale;Knowledge and understanding of Target Heart Rate Range (THRR);Able to understand and use Dyspnea scale;Understanding of Exercise Prescription Increase Physical Activity;Increase Strength and Stamina;Understanding of Exercise Prescription Increase Physical Activity;Increase Strength and Stamina;Understanding of Exercise Prescription Increase Physical Activity;Increase Strength and Stamina;Understanding of Exercise Prescription Increase Physical Activity;Increase Strength and Stamina;Understanding of Exercise Prescription   Comments Reviewed RPE and dyspnea scale, THR and program prescription with pt today.  Pt voiced understanding and was given a copy of goals to take home. Zuhaib is off to a good start in the program. During his first session, he did well on the treadmill at a speed of 2.2 mph with an inlcine of 2%. He also did well on the biostep at level 4. We will continue to monitor his progress in the program. Bernette Redbird is doing well in rehab. He increased his treadmill workload to a speed of 2.9 mph with a 8% incline. He also improved to level 7 on the XR and level 6 on the T4 nustep. He continues to use 5 lb hand weights for  resistance training as well. We will continue to monitor his progress in the program. Bernette Redbird continues to do well in rehab. He increased his treadmill workload but has been inconsistent with his speed ranging from 2.6-3.2 mph and incline ranging from 4-10%. He also began using the elliptical at level 1 and continues to use  the T4 nustep at level 5. We will continue to monitor his progress in the program. He continues to do well at rehab. working out at home as well getting exercise every day except for one, used to rest. climbing steps and doin gmore work at home, generally being more active.   Expected Outcomes Short: Use RPE daily to regulate intensity. Long: Follow program prescription in THR. Short: Continue to follow current exercise prescription. Long: Continue exercise to improve strength and stamina. Short: Continue to progressively increase treadmill workload. Long: Continue exercise to improve strength and stamina. Short: Consistently increase treadmill workload. Long: Continue exercise to improve strength and stamina. SHort: Continue to work here at rehab and at home. Long: increase strength and stanima    Row Name 11/22/22 1451             Exercise Goal Re-Evaluation   Exercise Goals Review Increase Physical Activity;Increase Strength and Stamina;Understanding of Exercise Prescription       Comments Bernette Redbird is doing well in rehab. He has stayed consistent with his workloads on the treadmill and T4 nustep. He improved from level 1 to level 6 on the elliptical. He also continues to use 5 lb hand weights for resistance training. We will continue to monitor his progress in the program.       Expected Outcomes Short: Increase to greater resistance with hand weights. Long: Continue to increase overall METs and stamina.                Discharge Exercise Prescription (Final Exercise Prescription Changes):  Exercise Prescription Changes - 11/22/22 1400       Response to Exercise   Blood  Pressure (Admit) 116/58    Blood Pressure (Exit) 100/56    Heart Rate (Admit) 70 bpm    Heart Rate (Exercise) 137 bpm    Heart Rate (Exit) 77 bpm    Rating of Perceived Exertion (Exercise) 16    Symptoms none    Duration Continue with 30 min of aerobic exercise without signs/symptoms of physical distress.    Intensity THRR unchanged      Progression   Progression Continue to progress workloads to maintain intensity without signs/symptoms of physical distress.    Average METs 6.35      Resistance Training   Training Prescription Yes    Weight 5 lb    Reps 10-15      Interval Training   Interval Training No      Treadmill   MPH 3    Grade 9    Minutes 15    METs 7.02      NuStep   Level 6    Minutes 15    METs 8.3      Elliptical   Level 6    Speed 3    Minutes 15    METs 3.4      Oxygen   Maintain Oxygen Saturation 88% or higher             Nutrition:  Target Goals: Understanding of nutrition guidelines, daily intake of sodium 1500mg , cholesterol 200mg , calories 30% from fat and 7% or less from saturated fats, daily to have 5 or more servings of fruits and vegetables.  Education: All About Nutrition: -Group instruction provided by verbal, written material, interactive activities, discussions, models, and posters to present general guidelines for heart healthy nutrition including fat, fiber, MyPlate, the role of sodium in heart healthy nutrition, utilization of the nutrition label, and utilization of this knowledge  for meal planning. Follow up email sent as well. Written material given at graduation. Flowsheet Row Cardiac Rehab from 11/02/2022 in [facility] Cardiac and Pulmonary Rehab  Education need identified 10/05/22       Biometrics:  Pre Biometrics - 10/05/22 1135       Pre Biometrics   Height 5\' 9"  (1.753 m)    Weight 190 lb 14.4 oz (86.6 kg)    Waist Circumference 36 inches    Hip Circumference 40.5 inches    Waist to Hip Ratio 0.89 %    BMI  (Calculated) 28.18    Single Leg Stand 30 seconds              Nutrition Therapy Plan and Nutrition Goals:  Nutrition Therapy & Goals - 10/05/22 1320       Nutrition Therapy   Diet Carb controlled, Cardiac, low Na    Protein (specify units) 90    Fiber 30 grams    Whole Grain Foods 3 servings    Saturated Fats 15 max. grams    Fruits and Vegetables 5 servings/day    Sodium 2 grams      Personal Nutrition Goals   Nutrition Goal Watch carb portions and aim for ~30-60g per meal    Personal Goal #2 Drink 48oz of water per day    Personal Goal #3 Shop for good snack ideas to help be consistent with eating during busy work hours    Comments Patient drinking 32oz of water daily. He was drinking a lot of sugary beverages, but has cut back on them since finding out about his DM dx. He works busy hours and often eats out. His food choices are not great, often carb heavy and full of sodium. Brainstormed some changes he could make to improve his meals. Recommended keeping carbs choices to one at meals, watching portions and not drinking sugary beverages. Reviewed mediterranean diet handout, educated on types of fats, sources, and how to read labels. Built out several meals and snacks with foods he likes and will eat, focusing on reducing saturated fat, sodium and controlling carbs per meal with goal of ~30-60g.      Intervention Plan   Intervention Prescribe, educate and counsel regarding individualized specific dietary modifications aiming towards targeted core components such as weight, hypertension, lipid management, diabetes, heart failure and other comorbidities.;Nutrition handout(s) given to patient.    Expected Outcomes Short Term Goal: Understand basic principles of dietary content, such as calories, fat, sodium, cholesterol and nutrients.;Short Term Goal: A plan has been developed with personal nutrition goals set during dietitian appointment.;Long Term Goal: Adherence to prescribed  nutrition plan.             Nutrition Assessments:  MEDIFICTS Score Key: >=70 Need to make dietary changes  40-70 Heart Healthy Diet <= 40 Therapeutic Level Cholesterol Diet  Flowsheet Row Cardiac Rehab from 10/05/2022 in [facility] Cardiac and Pulmonary Rehab  Picture Your Plate Total Score on Admission 39      Picture Your Plate Scores: <16 Unhealthy dietary pattern with much room for improvement. 41-50 Dietary pattern unlikely to meet recommendations for good health and room for improvement. 51-60 More healthful dietary pattern, with some room for improvement.  >60 Healthy dietary pattern, although there may be some specific behaviors that could be improved.    Nutrition Goals Re-Evaluation:  Nutrition Goals Re-Evaluation     Row Name 10/17/22 0755 11/14/22 0751           Goals  Comment He continues to check his BS, has been under 200 regularly which he is happy about. Spoke abut sodium in frozen meals. Saturated fat, and goal of less than ~15g per day. Overall, he reports he is doing better with nutrtion since meeting with RD He continue s to watch his BS. Usually under 180. his lows are around 110s. He is eating more greek yogurt and focusing on more balanced meals. Eating less fast food.      Expected Outcome Short: continue to check BS, keep carbs portions more controlled. Long: lower A1C, and build heart healthy eating habits. Short" COntinue to check BS, keep under 180. Long: lower A1C and chooses heart healthy foods               Nutrition Goals Discharge (Final Nutrition Goals Re-Evaluation):  Nutrition Goals Re-Evaluation - 11/14/22 0751       Goals   Comment He continue s to watch his BS. Usually under 180. his lows are around 110s. He is eating more greek yogurt and focusing on more balanced meals. Eating less fast food.    Expected Outcome Short" COntinue to check BS, keep under 180. Long: lower A1C and chooses heart healthy foods              Psychosocial: Target Goals: Acknowledge presence or absence of significant depression and/or stress, maximize coping skills, provide positive support system. Participant is able to verbalize types and ability to use techniques and skills needed for reducing stress and depression.   Education: Stress, Anxiety, and Depression - Group verbal and visual presentation to define topics covered.  Reviews how body is impacted by stress, anxiety, and depression.  Also discusses healthy ways to reduce stress and to treat/manage anxiety and depression.  Written material given at graduation.   Education: Sleep Hygiene -Provides group verbal and written instruction about how sleep can affect your health.  Define sleep hygiene, discuss sleep cycles and impact of sleep habits. Review good sleep hygiene tips.    Initial Review & Psychosocial Screening:  Initial Psych Review & Screening - 09/27/22 1325       Initial Review   Current issues with Current Sleep Concerns;Current Stress Concerns;Current Anxiety/Panic    Source of Stress Concerns Financial      Family Dynamics   Good Support System? Yes      Barriers   Psychosocial barriers to participate in program There are no identifiable barriers or psychosocial needs.;The patient should benefit from training in stress management and relaxation.      Screening Interventions   Interventions Encouraged to exercise;Provide feedback about the scores to participant;To provide support and resources with identified psychosocial needs    Expected Outcomes Long Term Goal: Stressors or current issues are controlled or eliminated.;Short Term goal: Utilizing psychosocial counselor, staff and physician to assist with identification of specific Stressors or current issues interfering with healing process. Setting desired goal for each stressor or current issue identified.;Short Term goal: Identification and review with participant of any Quality of Life or Depression  concerns found by scoring the questionnaire.;Long Term goal: The participant improves quality of Life and PHQ9 Scores as seen by post scores and/or verbalization of changes             Quality of Life Scores:   Quality of Life - 10/05/22 1111       Quality of Life   Select Quality of Life      Quality of Life Scores   Health/Function Pre  16.3 %    Socioeconomic Pre 20 %    Psych/Spiritual Pre 15.43 %    Family Pre 13.7 %    GLOBAL Pre 16.6 %            Scores of 19 and below usually indicate a poorer quality of life in these areas.  A difference of  2-3 points is a clinically meaningful difference.  A difference of 2-3 points in the total score of the Quality of Life Index has been associated with significant improvement in overall quality of life, self-image, physical symptoms, and general health in studies assessing change in quality of life.  PHQ-9: Review Flowsheet       10/20/2022 10/19/2022 10/05/2022 09/22/2022  Depression screen PHQ 2/9  Decreased Interest 0 0 0 0 0  Down, Depressed, Hopeless 0 0 1 2 0  PHQ - 2 Score 0 0 1 2 0  Altered sleeping 0 1 2 -  Tired, decreased energy 0 1 2 -  Change in appetite 0 1 3 -  Feeling bad or failure about yourself  0 0 0 -  Trouble concentrating 0 0 0 -  Moving slowly or fidgety/restless 0 0 3 -  Suicidal thoughts 0 0 0 -  PHQ-9 Score 0 4 12 -  Difficult doing work/chores Not difficult at all Not difficult at all Somewhat difficult -    Details       Multiple values from one day are sorted in reverse-chronological order        Interpretation of Total Score  Total Score Depression Severity:  1-4 = Minimal depression, 5-9 = Mild depression, 10-14 = Moderate depression, 15-19 = Moderately severe depression, 20-27 = Severe depression   Psychosocial Evaluation and Intervention:  Psychosocial Evaluation - 09/27/22 1335       Psychosocial Evaluation & Interventions   Interventions Encouraged to exercise with the  program and follow exercise prescription;Stress management education;Relaxation education    Comments 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 He was also newly diagnosed with diabetes. He admits it is taking time to get used to all the new medication and lifestyle changes he is having to make. He has met with a dietitian and is very interested in meeting with the program's dietitian to continue learning what he should be eating. He plans on returning to work in November, as it is physically demanding so he had to wait until then. He lives by himself and states he has a support system, but usually he is the one helping others and it is hard for him to reach out when he needs something. He has had issues with sleep for a long time, and recently noticed it has changed patterns. He is thinking this might be related to all the new medications. He has been shopping around for a blood pressure cuff and has found a glucometer he likes. He states he is very motivated to get started in the program because he wants to take care of his health.    Expected Outcomes Short: attend cardiac rehab for education and exercise. Long: develop and maintain positive self care habits    Continue Psychosocial Services  Follow up required by staff             Psychosocial Re-Evaluation:  Psychosocial Re-Evaluation     Row Name 10/19/22 0811 11/14/22 0746           Psychosocial Re-Evaluation   Current issues with Current Stress Concerns;Current Psychotropic  Meds Current Psychotropic Meds;Current Stress Concerns      Comments Reviewed patient health questionnaire (PHQ-9) with patient for follow up. Previously, patients score indicated signs/symptoms of depression.  Reviewed to see if patient is improving symptom wise while in program.  Score improved and patient states that it is because he is feeling better since his heart event. Work can cause some stress and anxiety. he is mindful of what he can  control and is determined to stay mentally positive. Continues to take his medication.      Expected Outcomes Short: Continue to attend [facility] regularly for regular exercise and social engagement. Long: Continue to improve symptoms and manage a positive mental state. Short: continue to attend rehab and focus on self when at work. Long: maintain and use stress reliving techniques to stay mentally postive on life and outcomes.      Interventions Encouraged to attend Cardiac Rehabilitation for the exercise Encouraged to attend Cardiac Rehabilitation for the exercise      Continue Psychosocial Services  Follow up required by staff Follow up required by staff               Psychosocial Discharge (Final Psychosocial Re-Evaluation):  Psychosocial Re-Evaluation - 11/14/22 0746       Psychosocial Re-Evaluation   Current issues with Current Psychotropic Meds;Current Stress Concerns    Comments Work can cause some stress and anxiety. he is mindful of what he can control and is determined to stay mentally positive. Continues to take his medication.    Expected Outcomes Short: continue to attend rehab and focus on self when at work. Long: maintain and use stress reliving techniques to stay mentally postive on life and outcomes.    Interventions Encouraged to attend Cardiac Rehabilitation for the exercise    Continue Psychosocial Services  Follow up required by staff             Vocational Rehabilitation: Provide vocational rehab assistance to qualifying candidates.   Vocational Rehab Evaluation & Intervention:  Vocational Rehab - 09/27/22 1325       Initial Vocational Rehab Evaluation & Intervention   Assessment shows need for Vocational Rehabilitation No             Education: Education Goals: Education classes will be provided on a variety of topics geared toward better understanding of heart health and risk factor modification. Participant will state understanding/return  demonstration of topics presented as noted by education test scores.  Learning Barriers/Preferences:  Learning Barriers/Preferences - 09/27/22 1324       Learning Barriers/Preferences   Learning Barriers None    Learning Preferences None             General Cardiac Education Topics:  AED/CPR: - Group verbal and written instruction with the use of models to demonstrate the basic use of the AED with the basic ABC's of resuscitation.   Anatomy and Cardiac Procedures: - Group verbal and visual presentation and models provide information about basic cardiac anatomy and function. Reviews the testing methods done to diagnose heart disease and the outcomes of the test results. Describes the treatment choices: Medical Management, Angioplasty, or Coronary Bypass Surgery for treating various heart conditions including Myocardial Infarction, Angina, Valve Disease, and Cardiac Arrhythmias.  Written material given at graduation.   Medication Safety: - Group verbal and visual instruction to review commonly prescribed medications for heart and lung disease. Reviews the medication, class of the drug, and side effects. Includes the steps to properly store meds and  maintain the prescription regimen.  Written material given at graduation.   Intimacy: - Group verbal instruction through game format to discuss how heart and lung disease can affect sexual intimacy. Written material given at graduation.. Flowsheet Row Cardiac Rehab from 11/02/2022 in [facility] Cardiac and Pulmonary Rehab  Date 11/02/22  Educator NT  Instruction Review Code 1- Verbalizes Understanding       Know Your Numbers and Heart Failure: - Group verbal and visual instruction to discuss disease risk factors for cardiac and pulmonary disease and treatment options.  Reviews associated critical values for Overweight/Obesity, Hypertension, Cholesterol, and Diabetes.  Discusses basics of heart failure: signs/symptoms and treatments.   Introduces Heart Failure Zone chart for action plan for heart failure.  Written material given at graduation. Flowsheet Row Cardiac Rehab from 11/02/2022 in [facility] Cardiac and Pulmonary Rehab  Education need identified 10/05/22       Infection Prevention: - Provides verbal and written material to individual with discussion of infection control including proper hand washing and proper equipment cleaning during exercise session. Flowsheet Row Cardiac Rehab from 11/02/2022 in [facility] Cardiac and Pulmonary Rehab  Date 10/05/22  Educator NT  Instruction Review Code 1- Verbalizes Understanding       Falls Prevention: - Provides verbal and written material to individual with discussion of falls prevention and safety. Flowsheet Row Cardiac Rehab from 11/02/2022 in [facility] Cardiac and Pulmonary Rehab  Date 10/05/22  Educator NT  Instruction Review Code 1- Verbalizes Understanding       Other: -Provides group and verbal instruction on various topics (see comments)   Knowledge Questionnaire Score:  Knowledge Questionnaire Score - 10/05/22 1111       Knowledge Questionnaire Score   Pre Score 21/26             Core Components/Risk Factors/Patient Goals at Admission:  Personal Goals and Risk Factors at Admission - 09/27/22 1316       Core Components/Risk Factors/Patient Goals on Admission   Diabetes Yes    Intervention Provide education about signs/symptoms and action to take for hypo/hyperglycemia.;Provide education about proper nutrition, including hydration, and aerobic/resistive exercise prescription along with prescribed medications to achieve blood glucose in normal ranges: Fasting glucose 65-99 mg/dL    Expected Outcomes Short Term: Participant verbalizes understanding of the signs/symptoms and immediate care of hyper/hypoglycemia, proper foot care and importance of medication, aerobic/resistive exercise and nutrition plan for blood glucose control.;Long Term: Attainment of HbA1C  < 7%.    Lipids Yes    Intervention Provide education and support for participant on nutrition & aerobic/resistive exercise along with prescribed medications to achieve LDL 70mg , HDL >40mg .    Expected Outcomes Short Term: Participant states understanding of desired cholesterol values and is compliant with medications prescribed. Participant is following exercise prescription and nutrition guidelines.;Long Term: Cholesterol controlled with medications as prescribed, with individualized exercise RX and with personalized nutrition plan. Value goals: LDL < 70mg , HDL > 40 mg.             Education:Diabetes - Individual verbal and written instruction to review signs/symptoms of diabetes, desired ranges of glucose level fasting, after meals and with exercise. Acknowledge that pre and post exercise glucose checks will be done for 3 sessions at entry of program. Flowsheet Row Cardiac Rehab from 11/02/2022 in [facility] Cardiac and Pulmonary Rehab  Date 09/27/22  Educator [facility]  Instruction Review Code 1- Verbalizes Understanding       Core Components/Risk Factors/Patient Goals Review:   Goals and Risk Factor Review  Row Name 10/19/22 1610 11/14/22 0753           Core Components/Risk Factors/Patient Goals Review   Personal Goals Review Diabetes;Weight Management/Obesity Weight Management/Obesity;Diabetes      Review Rocky Link is wanting to maintain his weight. He is checking his sugars at home. His blood sugar has been below 200 routinely. He has no questions about any of his medications at this time. He continue s to take his meds and controll his BS, most often between 110-180. Is hopeful that he could come off some medication He reports no questions at this time.      Expected Outcomes Short: continue to monitor blood sugar levels. Long: maintain blood sugar levels independently. Short: continue to check BS, speak with Dr about medications. Long: Maintain and control BS independently.                Core Components/Risk Factors/Patient Goals at Discharge (Final Review):   Goals and Risk Factor Review - 11/14/22 0753       Core Components/Risk Factors/Patient Goals Review   Personal Goals Review Weight Management/Obesity;Diabetes    Review He continue s to take his meds and controll his BS, most often between 110-180. Is hopeful that he could come off some medication He reports no questions at this time.    Expected Outcomes Short: continue to check BS, speak with Dr about medications. Long: Maintain and control BS independently.             ITP Comments:  ITP Comments     Row Name 09/27/22 1311 10/05/22 1105 10/07/22 0827 11/02/22 1038 11/23/22 0958   ITP Comments Initial phone call completed. Diagnosis can be found in [facility] 9/10. EP Orientation scheduled for Wednesday 10/2 8am. Completed and gym orientation. Initial ITP created and sent for review to Dr. Bethann Punches, Medical Director. First full day of exercise!  Patient was oriented to gym and equipment including functions, settings, policies, and procedures.  Patient's individual exercise prescription and treatment plan were reviewed.  All starting workloads were established based on the results of the 6 minute walk test done at initial orientation visit.  The plan for exercise progression was also introduced and progression will be customized based on patient's performance and goals. 30 Day review completed. Medical Director ITP review done, changes made as directed, and signed approval by Medical Director. new to program 30 Day review completed. Medical Director ITP review done, changes made as directed, and signed approval by Medical Director.            Comments:

## 2022-11-25 ENCOUNTER — Encounter: Payer: 59 | Admitting: *Deleted

## 2022-11-25 DIAGNOSIS — Z955 Presence of coronary angioplasty implant and graft: Secondary | ICD-10-CM

## 2022-11-25 DIAGNOSIS — Z713 Dietary counseling and surveillance: Secondary | ICD-10-CM | POA: Diagnosis not present

## 2022-11-25 DIAGNOSIS — I214 Non-ST elevation (NSTEMI) myocardial infarction: Secondary | ICD-10-CM

## 2022-11-25 NOTE — Progress Notes (Signed)
Daily Session Note  Patient Details  Name: Aaron Bowers MRN: [id number] Date of Birth: [birth year] Referring Provider:   Flowsheet Row Cardiac Rehab from 10/05/2022 in [facility] Cardiac and Pulmonary Rehab  Referring Provider Dr. [name], MD       Encounter Date: 11/25/2022  Check In:  Session Check In - 11/25/22 0804       Check-In   Supervising physician immediately available to respond to emergencies See telemetry face sheet for immediately available ER MD    Location [facility]-Cardiac & Pulmonary Rehab    Staff Present Cora Collum, RN, BSN, CCRP;Joseph Hood, RCP,RRT,BSRT;Noah Tickle, [place], Exercise Physiologist    Virtual Visit No    Medication changes reported     No    Fall or balance concerns reported    No    Warm-up and Cool-down Performed on first and last piece of equipment    Resistance Training Performed Yes    VAD Patient? No    PAD/SET Patient? No      Pain Assessment   Currently in Pain? No/denies                Social History   Tobacco Use  Smoking Status Never   Passive exposure: Past  Smokeless Tobacco Never    Goals Met:  Independence with exercise equipment Exercise tolerated well No report of concerns or symptoms today  Goals Unmet:  Not Applicable  Comments: Pt able to follow exercise prescription today without complaint.  Will continue to monitor for progression.    Dr. Bethann Punches is Medical Director for [facility] Cardiac Rehabilitation.  Dr. Vida Rigger is Medical Director for [facility] Pulmonary Rehabilitation.

## 2022-11-28 ENCOUNTER — Encounter: Payer: 59 | Admitting: *Deleted

## 2022-11-28 DIAGNOSIS — Z713 Dietary counseling and surveillance: Secondary | ICD-10-CM | POA: Diagnosis not present

## 2022-11-28 DIAGNOSIS — I214 Non-ST elevation (NSTEMI) myocardial infarction: Secondary | ICD-10-CM

## 2022-11-28 DIAGNOSIS — Z955 Presence of coronary angioplasty implant and graft: Secondary | ICD-10-CM

## 2022-11-28 NOTE — Progress Notes (Signed)
Daily Session Note  Patient Details  Name: Aaron Bowers MRN: [id number] Date of Birth: May 12, 1970 Referring Provider:   Flowsheet Row Cardiac Rehab from 10/05/2022 in [facility] Cardiac and Pulmonary Rehab  Referring Provider Dr. [name], MD       Encounter Date: 11/28/2022  Check In:  Session Check In - 11/28/22 0751       Check-In   Supervising physician immediately available to respond to emergencies See telemetry face sheet for immediately available ER MD    Location [facility]-Cardiac & Pulmonary Rehab    Staff Present Balinda Quails, RDN, LDN;Joseph Reino Kent, RCP,RRT,BSRT;Kelly Madilyn Fireman, BS, ACSM CEP, Exercise Physiologist; Grosser Katrinka Blazing, RN, ADN    Virtual Visit No    Medication changes reported     No    Fall or balance concerns reported    No    Warm-up and Cool-down Performed on first and last piece of equipment    Resistance Training Performed Yes    VAD Patient? No      Pain Assessment   Currently in Pain? No/denies                Social History   Tobacco Use  Smoking Status Never   Passive exposure: Past  Smokeless Tobacco Never    Goals Met:  Independence with exercise equipment Exercise tolerated well No report of concerns or symptoms today Strength training completed today  Goals Unmet:  Not Applicable  Comments: Pt able to follow exercise prescription today without complaint.  Will continue to monitor for progression.    Dr. Bethann Punches is Medical Director for [facility] Cardiac Rehabilitation.  Dr. Vida Rigger is Medical Director for [facility] Pulmonary Rehabilitation.

## 2022-11-30 ENCOUNTER — Encounter: Payer: 59 | Admitting: *Deleted

## 2022-11-30 ENCOUNTER — Encounter: Payer: Self-pay | Admitting: Nurse Practitioner

## 2022-11-30 DIAGNOSIS — I214 Non-ST elevation (NSTEMI) myocardial infarction: Secondary | ICD-10-CM

## 2022-11-30 DIAGNOSIS — Z955 Presence of coronary angioplasty implant and graft: Secondary | ICD-10-CM

## 2022-11-30 DIAGNOSIS — Z713 Dietary counseling and surveillance: Secondary | ICD-10-CM | POA: Diagnosis not present

## 2022-11-30 NOTE — Progress Notes (Signed)
Daily Session Note  Patient Details  Name: Aaron Bowers MRN: [id number] Date of Birth: 1970-03-13 Referring Provider:   Flowsheet Row Cardiac Rehab from 10/05/2022 in [facility] Cardiac and Pulmonary Rehab  Referring Provider Dr. [name], MD       Encounter Date: 11/30/2022  Check In:  Session Check In - 11/30/22 0749       Check-In   Supervising physician immediately available to respond to emergencies See telemetry face sheet for immediately available ER MD    Location [facility]-Cardiac & Pulmonary Rehab    Staff Present Cora Collum, RN, BSN, CCRP;Margaret Best, MS, Exercise Physiologist;Maxon Conetta BS, , Exercise Physiologist;Mikala Podoll Katrinka Blazing, RN, ADN    Virtual Visit No    Medication changes reported     No    Fall or balance concerns reported    No    Warm-up and Cool-down Performed on first and last piece of equipment    Resistance Training Performed Yes    VAD Patient? No    PAD/SET Patient? No      Pain Assessment   Currently in Pain? No/denies                Social History   Tobacco Use  Smoking Status Never   Passive exposure: Past  Smokeless Tobacco Never    Goals Met:  Independence with exercise equipment Exercise tolerated well No report of concerns or symptoms today Strength training completed today  Goals Unmet:  Not Applicable  Comments: Pt able to follow exercise prescription today without complaint.  Will continue to monitor for progression.    Dr. Bethann Punches is Medical Director for [facility] Cardiac Rehabilitation.  Dr. Vida Rigger is Medical Director for [facility] Pulmonary Rehabilitation.

## 2022-11-30 NOTE — Telephone Encounter (Signed)
Care team updated and letter sent for eye exam notes.

## 2022-12-05 ENCOUNTER — Encounter: Payer: 59 | Attending: Internal Medicine | Admitting: *Deleted

## 2022-12-05 ENCOUNTER — Other Ambulatory Visit: Payer: Self-pay | Admitting: *Deleted

## 2022-12-05 DIAGNOSIS — Z48812 Encounter for surgical aftercare following surgery on the circulatory system: Secondary | ICD-10-CM | POA: Diagnosis not present

## 2022-12-05 DIAGNOSIS — I214 Non-ST elevation (NSTEMI) myocardial infarction: Secondary | ICD-10-CM

## 2022-12-05 DIAGNOSIS — Z955 Presence of coronary angioplasty implant and graft: Secondary | ICD-10-CM | POA: Diagnosis present

## 2022-12-05 DIAGNOSIS — I252 Old myocardial infarction: Secondary | ICD-10-CM | POA: Insufficient documentation

## 2022-12-05 DIAGNOSIS — Z79899 Other long term (current) drug therapy: Secondary | ICD-10-CM

## 2022-12-05 DIAGNOSIS — E119 Type 2 diabetes mellitus without complications: Secondary | ICD-10-CM

## 2022-12-05 DIAGNOSIS — E785 Hyperlipidemia, unspecified: Secondary | ICD-10-CM

## 2022-12-05 NOTE — Progress Notes (Signed)
Daily Session Note  Patient Details  Name: Aaron Bowers MRN: [id number] Date of Birth: [birth year] Referring Provider:   Flowsheet Row Cardiac Rehab from 10/05/2022 in [facility] Cardiac and Pulmonary Rehab  Referring Provider Dr. [name], MD       Encounter Date: 12/05/2022  Check In:  Session Check In - 12/05/22 0806       Check-In   Supervising physician immediately available to respond to emergencies See telemetry face sheet for immediately available ER MD    Location [facility]-Cardiac & Pulmonary Rehab    Staff Present Tommye Standard, BS, ACSM CEP, Exercise Physiologist;Laureen Manson Passey, BS, RRT, CPFT;Jason Wallace Cullens, RDN, Clois Comber, RN, ADN    Virtual Visit No    Medication changes reported     No    Fall or balance concerns reported    No    Warm-up and Cool-down Performed on first and last piece of equipment    Resistance Training Performed Yes    VAD Patient? No    PAD/SET Patient? No      Pain Assessment   Currently in Pain? No/denies    Multiple Pain Sites No                Social History   Tobacco Use  Smoking Status Never   Passive exposure: Past  Smokeless Tobacco Never    Goals Met:  Proper associated with RPD/PD & O2 Sat Independence with exercise equipment Exercise tolerated well Personal goals reviewed Strength training completed today  Goals Unmet:  Not Applicable  Comments: Pt able to follow exercise prescription today without complaint.  Will continue to monitor for progression.   Cardiac:  Reviewed home exercise with pt today.  Pt plans to walking, steps, and weights for exercise.  Reviewed THR, pulse, RPE, sign and symptoms, pulse oximetery and when to call 911 or MD.  Also discussed weather considerations and indoor options.  Pt voiced understanding.     Dr. Bethann Punches is Medical Director for [facility] Cardiac Rehabilitation.  Dr. Vida Rigger is Medical Director for [facility] Pulmonary Rehabilitation.

## 2022-12-06 LAB — COMPREHENSIVE METABOLIC PANEL
ALT: 33 [IU]/L (ref 0–44)
AST: 27 [IU]/L (ref 0–40)
Albumin: 4.7 g/dL (ref 3.8–4.9)
Alkaline Phosphatase: 120 [IU]/L (ref 44–121)
BUN/Creatinine Ratio: 10 (ref 9–20)
BUN: 8 mg/dL (ref 6–24)
Bilirubin Total: 0.9 mg/dL (ref 0.0–1.2)
CO2: 25 mmol/L (ref 20–29)
Calcium: 9.4 mg/dL (ref 8.7–10.2)
Chloride: 102 mmol/L (ref 96–106)
Creatinine, Ser: 0.84 mg/dL (ref 0.76–1.27)
Globulin, Total: 2.6 g/dL (ref 1.5–4.5)
Glucose: 132 mg/dL — ABNORMAL HIGH (ref 70–99)
Potassium: 4.5 mmol/L (ref 3.5–5.2)
Sodium: 142 mmol/L (ref 134–144)
Total Protein: 7.3 g/dL (ref 6.0–8.5)
eGFR: 105 mL/min/{1.73_m2} (ref >59)

## 2022-12-06 LAB — LIPID PANEL
Chol/HDL Ratio: 2.5 {ratio} (ref 0.0–5.0)
Cholesterol, Total: 88 mg/dL — ABNORMAL LOW (ref 100–199)
HDL: 35 mg/dL — ABNORMAL LOW (ref >39)
LDL Chol Calc (NIH): 36 mg/dL (ref 0–99)
Triglycerides: 83 mg/dL (ref 0–149)
VLDL Cholesterol Cal: 17 mg/dL (ref 5–40)

## 2022-12-06 LAB — HEMOGLOBIN A1C
Est. average glucose Bld gHb Est-mCnc: 131 mg/dL
Hgb A1c MFr Bld: 6.2 % — ABNORMAL HIGH (ref 4.8–5.6)

## 2022-12-07 ENCOUNTER — Encounter: Payer: 59 | Admitting: *Deleted

## 2022-12-07 VITALS — Ht 69.0 in | Wt 188.0 lb

## 2022-12-07 DIAGNOSIS — Z955 Presence of coronary angioplasty implant and graft: Secondary | ICD-10-CM

## 2022-12-07 DIAGNOSIS — Z48812 Encounter for surgical aftercare following surgery on the circulatory system: Secondary | ICD-10-CM | POA: Diagnosis not present

## 2022-12-07 DIAGNOSIS — I214 Non-ST elevation (NSTEMI) myocardial infarction: Secondary | ICD-10-CM

## 2022-12-07 NOTE — Patient Instructions (Signed)
Discharge Patient Instructions  Patient Details  Name: Aaron Bowers MRN: [id number] Date of Birth: October 12, 1970 Referring Provider:  [name], FNP   Number of Visits: 36  Reason for Discharge:  Patient reached a stable level of exercise. Patient independent in their exercise. Patient has met program and personal goals.   Diagnosis:  No diagnosis found.  Initial Exercise Prescription:  Initial Exercise Prescription - 10/05/22 1100       Date of Initial Exercise RX and Referring Provider   Date 10/05/22    Referring Provider Dr. [name], MD      Oxygen   Maintain Oxygen Saturation 88% or higher      Treadmill   MPH 2.2    Grade 1    Minutes 15    METs 2.99      NuStep   Level 3    SPM 80    Minutes 15    METs 3.92      REL-XR   Level 3    Speed 50    Minutes 15    METs 3.92      Prescription Details   Frequency (times per week) 3    Duration Progress to 30 minutes of continuous aerobic without signs/symptoms of physical distress      Intensity   THRR 40-80% of Max Heartrate 104-146    Ratings of Perceived Exertion 11-13    Perceived Dyspnea 0-4      Progression   Progression Continue to progress workloads to maintain intensity without signs/symptoms of physical distress.      Resistance Training   Training Prescription Yes    Weight 5 lb    Reps 10-15             Functional Capacity:  6 Minute Walk     Row Name 10/05/22 1132 12/07/22 0746       6 Minute Walk   Phase Initial Discharge    Distance 1340 feet 1655 feet    Distance % Change -- 23.5 %    Distance Feet Change -- 315 ft    Walk Time 6 minutes 6 minutes    # of Rest Breaks 0 0    MPH 2.54 3.13    METS 3.92 4.3    RPE 7 11    Perceived Dyspnea  0 0    VO2 Peak 13.71 15    Symptoms No No    Resting HR 62 bpm 66 bpm    Resting BP 118/68 112/64    Resting Oxygen Saturation  98 % 98 %    Exercise Oxygen Saturation  during 6 min walk 99 % 98 %    Max Ex. HR  94 bpm 97 bpm    Max Ex. BP 136/72 104/60    2 Minute Post BP 128/68 --             Nutrition & Weight - Outcomes:  Pre Biometrics - 10/05/22 1135       Pre Biometrics   Height 5\' 9"  (1.753 m)    Weight 190 lb 14.4 oz (86.6 kg)    Waist Circumference 36 inches    Hip Circumference 40.5 inches    Waist to Hip Ratio 0.89 %    BMI (Calculated) 28.18    Single Leg Stand 30 seconds             Post Biometrics - 12/07/22 0748        Post  Biometrics   Height  5\' 9"  (1.753 m)    Weight 188 lb (85.3 kg)    Waist Circumference 35 inches    Hip Circumference 40.5 inches    Waist to Hip Ratio 0.86 %    BMI (Calculated) 27.75    Single Leg Stand 30 seconds             Goals reviewed with patient; copy given to patient.

## 2022-12-07 NOTE — Progress Notes (Signed)
Daily Session Note  Patient Details  Name: Aaron Bowers MRN: [id number] Date of Birth: Jun 07, 1970 Referring Provider:   Flowsheet Row Cardiac Rehab from 10/05/2022 in [facility] Cardiac and Pulmonary Rehab  Referring Provider Dr. [name], MD       Encounter Date: 12/07/2022  Check In:  Session Check In - 12/07/22 0750       Check-In   Supervising physician immediately available to respond to emergencies See telemetry face sheet for immediately available ER MD    Location [facility]-Cardiac & Pulmonary Rehab    Staff Present Rory Percy, MS, Exercise Physiologist;Joseph Reino Kent, RCP,RRT,BSRT;Maxon Clyde BS, , Exercise Physiologist;Gianni Fuchs Katrinka Blazing, RN, ADN    Virtual Visit No    Medication changes reported     No    Fall or balance concerns reported    No    Warm-up and Cool-down Performed on first and last piece of equipment    Resistance Training Performed Yes    VAD Patient? No    PAD/SET Patient? No      Pain Assessment   Currently in Pain? No/denies                Social History   Tobacco Use  Smoking Status Never   Passive exposure: Past  Smokeless Tobacco Never    Goals Met:  Independence with exercise equipment Exercise tolerated well No report of concerns or symptoms today Strength training completed today  Goals Unmet:  Not Applicable  Comments: Pt able to follow exercise prescription today without complaint.  Will continue to monitor for progression.   6 Minute Walk     Row Name 10/05/22 1132 12/07/22 0746       6 Minute Walk   Phase Initial Discharge    Distance 1340 feet 1655 feet    Distance % Change -- 23.5 %    Distance Feet Change -- 315 ft    Walk Time 6 minutes 6 minutes    # of Rest Breaks 0 0    MPH 2.54 3.13    METS 3.92 4.3    RPE 7 11    Perceived Dyspnea  0 0    VO2 Peak 13.71 15    Symptoms No No    Resting HR 62 bpm 66 bpm    Resting BP 118/68 112/64    Resting Oxygen Saturation  98 % 98 %    Exercise Oxygen  Saturation  during 6 min walk 99 % 98 %    Max Ex. HR 94 bpm 97 bpm    Max Ex. BP 136/72 104/60    2 Minute Post BP 128/68 --               Dr. Bethann Punches is Medical Director for [facility] Cardiac Rehabilitation.  Dr. Vida Rigger is Medical Director for [facility] Pulmonary Rehabilitation.

## 2022-12-09 ENCOUNTER — Encounter: Payer: 59 | Admitting: *Deleted

## 2022-12-09 DIAGNOSIS — I214 Non-ST elevation (NSTEMI) myocardial infarction: Secondary | ICD-10-CM

## 2022-12-09 DIAGNOSIS — Z955 Presence of coronary angioplasty implant and graft: Secondary | ICD-10-CM

## 2022-12-09 DIAGNOSIS — Z48812 Encounter for surgical aftercare following surgery on the circulatory system: Secondary | ICD-10-CM | POA: Diagnosis not present

## 2022-12-09 NOTE — Progress Notes (Signed)
Daily Session Note  Patient Details  Name: PANKAJ HOUCHEN MRN: [id number] Date of Birth: [birth year] Referring Provider:   Flowsheet Row Cardiac Rehab from 10/05/2022 in [facility] Cardiac and Pulmonary Rehab  Referring Provider Dr. [name], MD       Encounter Date: 12/09/2022  Check In:  Session Check In - 12/09/22 0933       Check-In   Supervising physician immediately available to respond to emergencies See telemetry face sheet for immediately available ER MD    Location [facility]-Cardiac & Pulmonary Rehab    Staff Present Elige Ko, RCP,RRT,BSRT;Noah Tickle, BS, Exercise Physiologist;Travonna Swindle, RN, BSN, CCRP    Virtual Visit No    Medication changes reported     No    Fall or balance concerns reported    No    Warm-up and Cool-down Performed on first and last piece of equipment    Resistance Training Performed Yes    VAD Patient? No    PAD/SET Patient? No      Pain Assessment   Currently in Pain? No/denies                Social History   Tobacco Use  Smoking Status Never   Passive exposure: Past  Smokeless Tobacco Never    Goals Met:  Independence with exercise equipment Exercise tolerated well No report of concerns or symptoms today  Goals Unmet:  Not Applicable  Comments: Pt able to follow exercise prescription today without complaint.  Will continue to monitor for progression.    Dr. Bethann Punches is Medical Director for [facility] Cardiac Rehabilitation.  Dr. Vida Rigger is Medical Director for [facility] Pulmonary Rehabilitation.

## 2022-12-10 ENCOUNTER — Other Ambulatory Visit: Payer: Self-pay | Admitting: Nurse Practitioner

## 2022-12-10 ENCOUNTER — Other Ambulatory Visit: Payer: Self-pay | Admitting: Physician Assistant

## 2022-12-12 ENCOUNTER — Encounter: Payer: 59 | Admitting: *Deleted

## 2022-12-12 DIAGNOSIS — I214 Non-ST elevation (NSTEMI) myocardial infarction: Secondary | ICD-10-CM

## 2022-12-12 DIAGNOSIS — Z48812 Encounter for surgical aftercare following surgery on the circulatory system: Secondary | ICD-10-CM | POA: Diagnosis not present

## 2022-12-12 DIAGNOSIS — Z955 Presence of coronary angioplasty implant and graft: Secondary | ICD-10-CM

## 2022-12-12 NOTE — Progress Notes (Signed)
Daily Session Note  Patient Details  Name: Aaron Bowers MRN: [id number] Date of Birth: July 25, 1970 Referring Provider:   Flowsheet Row Cardiac Rehab from 10/05/2022 in [facility] Cardiac and Pulmonary Rehab  Referring Provider Dr. [name], MD       Encounter Date: 12/12/2022  Check In:  Session Check In - 12/12/22 0802       Check-In   Supervising physician immediately available to respond to emergencies See telemetry face sheet for immediately available ER MD    Location [facility]-Cardiac & Pulmonary Rehab    Staff Present Cora Collum, RN, BSN, CCRP;Jason Wallace Cullens, RDN, LDN;Joseph Baird, Guinevere Ferrari, RN, [place]    Virtual Visit No    Medication changes reported     No    Fall or balance concerns reported    No    Warm-up and Cool-down Performed on first and last piece of equipment    Resistance Training Performed Yes    VAD Patient? No    PAD/SET Patient? No      Pain Assessment   Currently in Pain? No/denies                Social History   Tobacco Use  Smoking Status Never   Passive exposure: Past  Smokeless Tobacco Never    Goals Met:  Independence with exercise equipment Exercise tolerated well No report of concerns or symptoms today Strength training completed today  Goals Unmet:  Not Applicable  Comments: Pt able to follow exercise prescription today without complaint.  Will continue to monitor for progression.    Dr. Bethann Punches is Medical Director for [facility] Cardiac Rehabilitation.  Dr. Vida Rigger is Medical Director for [facility] Pulmonary Rehabilitation.

## 2022-12-12 NOTE — Telephone Encounter (Signed)
last visit: 11/18/22 with plan to Follow-up in 1 months. Next visit: 12/19/22  11/18/22 office note  Remains on metformin 500 mg twice daily.  Check A1c.  Ongoing management per PCP.  Will deny Metformin requesting pharmacy forward to PCP.

## 2022-12-13 ENCOUNTER — Other Ambulatory Visit: Payer: Self-pay

## 2022-12-13 NOTE — Telephone Encounter (Signed)
Requested medication (s) are due for refill today: Yes  Requested medication (s) are on the active medication list: Yes  Last refill:  09/15/22  Future visit scheduled: Yes  Notes to clinic:  Unable to refill per protocol, last refill by another provider.      Requested Prescriptions  Pending Prescriptions Disp Refills   ezetimibe (ZETIA) 10 MG tablet [Pharmacy Med Name: EZETIMIBE 10 MG TABLET] 90 tablet 1    Sig: TAKE 1 TABLET BY MOUTH EVERY DAY     Cardiovascular:  Antilipid - Sterol Transport Inhibitors Failed - 12/13/2022  9:55 AM      Failed - Lipid Panel in normal range within the last 12 months    Cholesterol, Total  Date Value Ref Range Status  12/05/2022 88 (L) 100 - 199 mg/dL Final   LDL Chol Calc (NIH)  Date Value Ref Range Status  12/05/2022 36 0 - 99 mg/dL Final   HDL  Date Value Ref Range Status  12/05/2022 35 (L) >39 mg/dL Final   Triglycerides  Date Value Ref Range Status  12/05/2022 83 0 - 149 mg/dL Final         Passed - AST in normal range and within 360 days    AST  Date Value Ref Range Status  12/05/2022 27 0 - 40 IU/L Final         Passed - ALT in normal range and within 360 days    ALT  Date Value Ref Range Status  12/05/2022 33 0 - 44 IU/L Final         Passed - Patient is not pregnant      Passed - Valid encounter within last 12 months    Recent Outpatient Visits           1 month ago Bipolar 1 disorder [facility])   [facility] [name], FNP       Future Appointments             In 6 days Dunn, Raymon Mutton, PA-C [facility] [place]   In 1 week Zane Herald, Rudolpho Sevin, FNP [facility], [facility]

## 2022-12-13 NOTE — Telephone Encounter (Signed)
Requested medication (s) are due for refill today: yes   Requested medication (s) are on the active medication list: yes   Last refill:  09/16/22 #30 3 refills  Future visit scheduled: yes in 1 week  Notes to clinic:  last ordered by Marcelino Duster, MD 09/15/22 do you want to order Rx?     Requested Prescriptions  Pending Prescriptions Disp Refills   ezetimibe (ZETIA) 10 MG tablet [Pharmacy Med Name: EZETIMIBE 10 MG TABLET] 90 tablet 1    Sig: TAKE 1 TABLET BY MOUTH EVERY DAY     Cardiovascular:  Antilipid - Sterol Transport Inhibitors Failed - 12/13/2022 10:06 AM      Failed - Lipid Panel in normal range within the last 12 months    Cholesterol, Total  Date Value Ref Range Status  12/05/2022 88 (L) 100 - 199 mg/dL Final   LDL Chol Calc (NIH)  Date Value Ref Range Status  12/05/2022 36 0 - 99 mg/dL Final   HDL  Date Value Ref Range Status  12/05/2022 35 (L) >39 mg/dL Final   Triglycerides  Date Value Ref Range Status  12/05/2022 83 0 - 149 mg/dL Final         Passed - AST in normal range and within 360 days    AST  Date Value Ref Range Status  12/05/2022 27 0 - 40 IU/L Final         Passed - ALT in normal range and within 360 days    ALT  Date Value Ref Range Status  12/05/2022 33 0 - 44 IU/L Final         Passed - Patient is not pregnant      Passed - Valid encounter within last 12 months    Recent Outpatient Visits           1 month ago Bipolar 1 disorder [facility])   [facility] [name], FNP       Future Appointments             In 6 days Dunn, Raymon Mutton, PA-C [facility] [place]   In 1 week Zane Herald, Rudolpho Sevin, FNP [facility], [facility]

## 2022-12-14 ENCOUNTER — Encounter: Payer: 59 | Admitting: *Deleted

## 2022-12-14 ENCOUNTER — Other Ambulatory Visit: Payer: Self-pay | Admitting: Physician Assistant

## 2022-12-14 DIAGNOSIS — I214 Non-ST elevation (NSTEMI) myocardial infarction: Secondary | ICD-10-CM

## 2022-12-14 DIAGNOSIS — Z955 Presence of coronary angioplasty implant and graft: Secondary | ICD-10-CM

## 2022-12-14 DIAGNOSIS — Z48812 Encounter for surgical aftercare following surgery on the circulatory system: Secondary | ICD-10-CM | POA: Diagnosis not present

## 2022-12-14 NOTE — Progress Notes (Signed)
Daily Session Note  Patient Details  Name: Aaron Bowers MRN: [id number] Date of Birth: Mar 13, 1970 Referring Provider:   Flowsheet Row Cardiac Rehab from 10/05/2022 in [facility] Cardiac and Pulmonary Rehab  Referring Provider Dr. [name], MD       Encounter Date: 12/14/2022  Check In:  Session Check In - 12/14/22 0749       Check-In   Supervising physician immediately available to respond to emergencies See telemetry face sheet for immediately available ER MD    Location [facility]-Cardiac & Pulmonary Rehab    Staff Present Cora Collum, RN, BSN, CCRP;Noah Tickle, BS, Exercise Physiologist;Maxon Conetta BS, , Exercise Physiologist;Charlese Gruetzmacher Katrinka Blazing, RN, ADN    Virtual Visit No    Medication changes reported     No    Fall or balance concerns reported    No    Warm-up and Cool-down Performed on first and last piece of equipment    Resistance Training Performed Yes    VAD Patient? No    PAD/SET Patient? No      Pain Assessment   Currently in Pain? No/denies                Social History   Tobacco Use  Smoking Status Never   Passive exposure: Past  Smokeless Tobacco Never    Goals Met:  Independence with exercise equipment Exercise tolerated well No report of concerns or symptoms today Strength training completed today  Goals Unmet:  Not Applicable  Comments: Pt able to follow exercise prescription today without complaint.  Will continue to monitor for progression.    Dr. Bethann Punches is Medical Director for [facility] Cardiac Rehabilitation.  Dr. Vida Rigger is Medical Director for [facility] Pulmonary Rehabilitation.

## 2022-12-15 ENCOUNTER — Other Ambulatory Visit: Payer: Self-pay | Admitting: Nurse Practitioner

## 2022-12-15 ENCOUNTER — Other Ambulatory Visit: Payer: Self-pay

## 2022-12-15 ENCOUNTER — Encounter: Payer: Self-pay | Admitting: Nurse Practitioner

## 2022-12-15 DIAGNOSIS — E119 Type 2 diabetes mellitus without complications: Secondary | ICD-10-CM

## 2022-12-15 MED ORDER — LOSARTAN POTASSIUM 25 MG PO TABS: 12.5000 mg | ORAL_TABLET | Freq: Every day | ORAL | 3 refills | Status: DC

## 2022-12-15 MED ORDER — EZETIMIBE 10 MG PO TABS: 10.0000 mg | ORAL_TABLET | Freq: Every day | ORAL | 3 refills | Status: DC

## 2022-12-15 MED ORDER — METFORMIN HCL ER 500 MG PO TB24: 500.0000 mg | ORAL_TABLET | Freq: Two times a day (BID) | ORAL | 3 refills | Status: DC

## 2022-12-15 MED ORDER — PRASUGREL HCL 10 MG PO TABS: 10.0000 mg | ORAL_TABLET | Freq: Every day | ORAL | 3 refills | Status: DC

## 2022-12-16 ENCOUNTER — Ambulatory Visit: Payer: 59 | Attending: Physician Assistant

## 2022-12-16 ENCOUNTER — Encounter: Payer: 59 | Admitting: *Deleted

## 2022-12-16 DIAGNOSIS — Z955 Presence of coronary angioplasty implant and graft: Secondary | ICD-10-CM

## 2022-12-16 DIAGNOSIS — I255 Ischemic cardiomyopathy: Secondary | ICD-10-CM | POA: Diagnosis not present

## 2022-12-16 DIAGNOSIS — Z48812 Encounter for surgical aftercare following surgery on the circulatory system: Secondary | ICD-10-CM | POA: Diagnosis not present

## 2022-12-16 DIAGNOSIS — I214 Non-ST elevation (NSTEMI) myocardial infarction: Secondary | ICD-10-CM

## 2022-12-16 DIAGNOSIS — I5020 Unspecified systolic (congestive) heart failure: Secondary | ICD-10-CM | POA: Diagnosis not present

## 2022-12-16 LAB — ECHOCARDIOGRAM LIMITED
Area-P 1/2: 3.19 cm2
Calc EF: 54.1 %
S' Lateral: 3.3 cm
Single Plane A2C EF: 52.7 %
Single Plane A4C EF: 53.1 %

## 2022-12-16 NOTE — Progress Notes (Signed)
Discharge Summary:  [name]  (DOB: [birth year])  Aaron Bowers graduated today from  rehab with 36 sessions completed.  Details of the patient's exercise prescription and what He needs to do in order to continue the prescription and progress were discussed with patient.  Patient was given a copy of prescription and goals.  Patient verbalized understanding. Treven plans to continue to exercise by walking and steps.   6 Minute Walk     Row Name 10/05/22 1132 12/07/22 0746       6 Minute Walk   Phase Initial Discharge    Distance 1340 feet 1655 feet    Distance % Change -- 23.5 %    Distance Feet Change -- 315 ft    Walk Time 6 minutes 6 minutes    # of Rest Breaks 0 0    MPH 2.54 3.13    METS 3.92 4.3    RPE 7 11    Perceived Dyspnea  0 0    VO2 Peak 13.71 15    Symptoms No No    Resting HR 62 bpm 66 bpm    Resting BP 118/68 112/64    Resting Oxygen Saturation  98 % 98 %    Exercise Oxygen Saturation  during 6 min walk 99 % 98 %    Max Ex. HR 94 bpm 97 bpm    Max Ex. BP 136/72 104/60    2 Minute Post BP 128/68 --

## 2022-12-16 NOTE — Progress Notes (Signed)
Cardiac Individual Treatment Plan  Patient Details  Name: Aaron Bowers MRN: [id number] Date of Birth: 09-19-1970 Referring Provider:   Flowsheet Row Cardiac Rehab from 10/05/2022 in [facility] Cardiac and Pulmonary Rehab  Referring Provider Dr. [name], MD       Initial Encounter Date:  Flowsheet Row Cardiac Rehab from 10/05/2022 in [facility] Cardiac and Pulmonary Rehab  Date 10/05/22       Visit Diagnosis: NSTEMI (non-ST elevated myocardial infarction) [name])  Status post coronary artery stent placement  Patient's Home Medications on Admission:  Current Outpatient Medications:    aspirin (ASPIRIN LOW DOSE) 81 MG chewable tablet, CHEW 1 TABLET BY MOUTH DAILY., Disp: 90 tablet, Rfl: 0   atorvastatin (LIPITOR) 80 MG tablet, TAKE 1 TABLET BY MOUTH EVERY DAY, Disp: 90 tablet, Rfl: 0   divalproex (DEPAKOTE ER) 500 MG 24 hr tablet, Take 1 tablet (500 mg total) by mouth 3 (three) times daily., Disp: 270 tablet, Rfl: 1   empagliflozin (JARDIANCE) 10 MG TABS tablet, Take 1 tablet (10 mg total) by mouth daily before breakfast., Disp: 90 tablet, Rfl: 3   ezetimibe (ZETIA) 10 MG tablet, Take 1 tablet (10 mg total) by mouth daily., Disp: 90 tablet, Rfl: 3   furosemide (LASIX) 20 MG tablet, Take 1 tablet (20 mg total) by mouth every other day., Disp: 90 tablet, Rfl: 3   lamoTRIgine (LAMICTAL) 150 MG tablet, Take 150 mg by mouth 2 (two) times daily., Disp: , Rfl:    losartan (COZAAR) 25 MG tablet, Take 0.5 tablets (12.5 mg total) by mouth daily., Disp: 45 tablet, Rfl: 3   metFORMIN (GLUCOPHAGE-XR) 500 MG 24 hr tablet, Take 1 tablet (500 mg total) by mouth 2 (two) times daily with a meal., Disp: 180 tablet, Rfl: 3   metoprolol succinate (TOPROL-XL) 25 MG 24 hr tablet, TAKE 0.5 TABLETS BY MOUTH AT BEDTIME., Disp: 45 tablet, Rfl: 0   potassium chloride (KLOR-CON M) 10 MEQ tablet, Take 1 tablet (10 mEq total) by mouth every other day. Take only when taking Lasix (furosemide)., Disp: 90 tablet, Rfl:  3   prasugrel (EFFIENT) 10 MG TABS tablet, Take 1 tablet (10 mg total) by mouth daily., Disp: 90 tablet, Rfl: 3  Past Medical History: Past Medical History:  Diagnosis Date   Bipolar disorder (HCC)    Borderline hyperlipidemia    CAD (coronary artery disease)    Diabetes mellitus (HCC)    Hyperlipemia     Tobacco Use: Social History   Tobacco Use  Smoking Status Never   Passive exposure: Past  Smokeless Tobacco Never    Labs: Review Flowsheet       Latest Ref Rng & Units 09/14/2022 12/05/2022  Labs for ITP Cardiac and Pulmonary Rehab  Cholestrol 100 - 199 mg/dL 244  88   LDL (calc) 0 - 99 mg/dL 010  36   HDL-C >27 mg/dL 31  35   Trlycerides 0 - 149 mg/dL 253  83   Hemoglobin G6Y 4.8 - 5.6 % 7.1  6.2      Exercise Target Goals: Exercise Program Goal: Individual exercise prescription set using results from initial 6 min walk test and THRR while considering  patient's activity barriers and safety.   Exercise Prescription Goal: Initial exercise prescription builds to 30-45 minutes a day of aerobic activity, 2-3 days per week.  Home exercise guidelines will be given to patient during program as part of exercise prescription that the participant will acknowledge.   Education: Aerobic Exercise: - Group verbal  and visual presentation on the components of exercise prescription. Introduces F.I.T.T principle from ACSM for exercise prescriptions.  Reviews F.I.T.T. principles of aerobic exercise including progression. Written material given at graduation. Flowsheet Row Cardiac Rehab from 11/02/2022 in [facility] Cardiac and Pulmonary Rehab  Education need identified 10/05/22  Date 11/02/22  Educator NT  Instruction Review Code 1- Verbalizes Understanding       Education: Resistance Exercise: - Group verbal and visual presentation on the components of exercise prescription. Introduces F.I.T.T principle from ACSM for exercise prescriptions  Reviews F.I.T.T. principles of resistance  exercise including progression. Written material given at graduation. Flowsheet Row Cardiac Rehab from 11/02/2022 in [facility] Cardiac and Pulmonary Rehab  Date 10/26/22  Educator NT  Instruction Review Code 1- Verbalizes Understanding        Education: Exercise & Equipment Safety: - Individual verbal instruction and demonstration of equipment use and safety with use of the equipment. Flowsheet Row Cardiac Rehab from 11/02/2022 in [facility] Cardiac and Pulmonary Rehab  Date 10/05/22  Educator NT  Instruction Review Code 1- Verbalizes Understanding       Education: Exercise Physiology & General Exercise Guidelines: - Group verbal and written instruction with models to review the exercise physiology of the cardiovascular system and associated critical values. Provides general exercise guidelines with specific guidelines to those with heart or lung disease.  Flowsheet Row Cardiac Rehab from 11/02/2022 in [facility] Cardiac and Pulmonary Rehab  Date 10/19/22  Educator NT  Instruction Review Code 1- [employer] Understanding       Education: Flexibility, Balance, Mind/Body Relaxation: - Group verbal and visual presentation with interactive activity on the components of exercise prescription. Introduces F.I.T.T principle from ACSM for exercise prescriptions. Reviews F.I.T.T. principles of flexibility and balance exercise training including progression. Also discusses the mind body connection.  Reviews various relaxation techniques to help reduce and manage stress (i.e. Deep breathing, progressive muscle relaxation, and visualization). Balance handout provided to take home. Written material given at graduation. Flowsheet Row Cardiac Rehab from 11/02/2022 in [facility] Cardiac and Pulmonary Rehab  Date 10/26/22  Educator NT  Instruction Review Code 1- Verbalizes Understanding       Activity Barriers & Risk Stratification:  Activity Barriers & Cardiac Risk Stratification - 10/05/22 1134       Activity  Barriers & Cardiac Risk Stratification   Activity Barriers Neck/Spine Problems   bulging disc   Cardiac Risk Stratification Moderate             6 Minute Walk:  6 Minute Walk     Row Name 10/05/22 1132 12/07/22 0746       6 Minute Walk   Phase Initial Discharge    Distance 1340 feet 1655 feet    Distance % Change -- 23.5 %    Distance Feet Change -- 315 ft    Walk Time 6 minutes 6 minutes    # of Rest Breaks 0 0    MPH 2.54 3.13    METS 3.92 4.3    RPE 7 11    Perceived Dyspnea  0 0    VO2 Peak 13.71 15    Symptoms No No    Resting HR 62 bpm 66 bpm    Resting BP 118/68 112/64    Resting Oxygen Saturation  98 % 98 %    Exercise Oxygen Saturation  during 6 min walk 99 % 98 %    Max Ex. HR 94 bpm 97 bpm    Max Ex. BP 136/72 104/60  2 Minute Post BP 128/68 --             Oxygen Initial Assessment:   Oxygen Re-Evaluation:   Oxygen Discharge (Final Oxygen Re-Evaluation):   Initial Exercise Prescription:  Initial Exercise Prescription - 10/05/22 1100       Date of Initial Exercise RX and Referring Provider   Date 10/05/22    Referring Provider Dr. [name], MD      Oxygen   Maintain Oxygen Saturation 88% or higher      Treadmill   MPH 2.2    Grade 1    Minutes 15    METs 2.99      NuStep   Level 3    SPM 80    Minutes 15    METs 3.92      REL-XR   Level 3    Speed 50    Minutes 15    METs 3.92      Prescription Details   Frequency (times per week) 3    Duration Progress to 30 minutes of continuous aerobic without signs/symptoms of physical distress      Intensity   THRR 40-80% of Max Heartrate 104-146    Ratings of Perceived Exertion 11-13    Perceived Dyspnea 0-4      Progression   Progression Continue to progress workloads to maintain intensity without signs/symptoms of physical distress.      Resistance Training   Training Prescription Yes    Weight 5 lb    Reps 10-15             Perform Capillary Blood  Glucose checks as needed.  Exercise Prescription Changes:   Exercise Prescription Changes     Row Name 10/05/22 1100 10/13/22 1300 10/26/22 0700 11/10/22 1300 11/22/22 1400     Response to Exercise   Blood Pressure (Admit) 118/68 128/70 114/66 120/62 116/58   Blood Pressure (Exercise) 136/72 130/62 140/82 128/58 --   Blood Pressure (Exit) 128/68 108/60 122/64 108/60 100/56   Heart Rate (Admit) 62 bpm 82 bpm 80 bpm 75 bpm 70 bpm   Heart Rate (Exercise) 94 bpm 98 bpm 135 bpm 148 bpm 137 bpm   Heart Rate (Exit) 66 bpm 72 bpm 90 bpm 88 bpm 77 bpm   Oxygen Saturation (Admit) 98 % -- -- -- --   Oxygen Saturation (Exercise) 99 % -- -- -- --   Rating of Perceived Exertion (Exercise) 7 11 15 17 16    Perceived Dyspnea (Exercise) 0 -- -- -- --   Symptoms None none none none none   Comments Results First full day of exercise -- -- --   Duration -- Continue with 30 min of aerobic exercise without signs/symptoms of physical distress. Continue with 30 min of aerobic exercise without signs/symptoms of physical distress. Continue with 30 min of aerobic exercise without signs/symptoms of physical distress. Continue with 30 min of aerobic exercise without signs/symptoms of physical distress.   Intensity -- THRR unchanged THRR unchanged THRR unchanged THRR unchanged     Progression   Progression -- Continue to progress workloads to maintain intensity without signs/symptoms of physical distress. Continue to progress workloads to maintain intensity without signs/symptoms of physical distress. Continue to progress workloads to maintain intensity without signs/symptoms of physical distress. Continue to progress workloads to maintain intensity without signs/symptoms of physical distress.   Average METs -- 3.4 5.84 5.95 6.35     Resistance Training   Training Prescription -- Yes Yes Yes Yes  Weight -- 5 lb 5 lb 5 lb 5 lb   Reps -- 10-15 10-15 10-15 10-15     Interval Training   Interval Training -- No  No No No     Treadmill   MPH -- 2.2 2.9 3.2 3   Grade -- 2 8 9 9    Minutes -- 15 15 15 15    METs -- 3.29 6.42 7.42 7.02     NuStep   Level -- -- 6 5 6    Minutes -- -- 15 15 15    METs -- -- 6.5 8.7 8.3     Elliptical   Level -- -- -- 1 6   Speed -- -- -- 3 3   Minutes -- -- -- 15 15   METs -- -- -- 7 3.4     REL-XR   Level -- -- 7 -- --   Minutes -- -- 15 -- --   METs -- -- 6.4 -- --     Biostep-RELP   Level -- 4 -- -- --   Minutes -- 15 -- -- --   METs -- 3.5 -- -- --     Oxygen   Maintain Oxygen Saturation -- 88% or higher 88% or higher 88% or higher 88% or higher    Row Name 12/05/22 0800 12/07/22 0800           Response to Exercise   Blood Pressure (Admit) -- 102/60      Blood Pressure (Exit) -- 108/60      Heart Rate (Admit) -- 69 bpm      Heart Rate (Exercise) -- 129 bpm      Heart Rate (Exit) -- 66 bpm      Rating of Perceived Exertion (Exercise) -- 17      Symptoms -- none      Duration Continue with 30 min of aerobic exercise without signs/symptoms of physical distress. Continue with 30 min of aerobic exercise without signs/symptoms of physical distress.      Intensity THRR unchanged THRR unchanged        Progression   Progression Continue to progress workloads to maintain intensity without signs/symptoms of physical distress. Continue to progress workloads to maintain intensity without signs/symptoms of physical distress.      Average METs 6.35 6.95        Resistance Training   Training Prescription Yes Yes      Weight 5 lb 5 lb      Reps 10-15 10-15        Interval Training   Interval Training No No        Treadmill   MPH 3 3      Grade 9 15      Minutes 15 30      METs 7.02 9.5        NuStep   Level 6 5      Minutes 15 15      METs 8.3 6.1        Elliptical   Level 6 --      Speed 3 --      Minutes 15 --      METs 3.4 --        Home Exercise Plan   Plans to continue exercise at Home (comment)  walk and steps. Home (comment)   walk and steps.      Frequency Add 2 additional days to program exercise sessions. Add 2 additional days to program exercise sessions.  Initial Home Exercises Provided 12/05/22 12/05/22        Oxygen   Maintain Oxygen Saturation 88% or higher 88% or higher               Exercise Comments:   Exercise Comments     Row Name 10/07/22 0827           Exercise Comments First full day of exercise!  Patient was oriented to gym and equipment including functions, settings, policies, and procedures.  Patient's individual exercise prescription and treatment plan were reviewed.  All starting workloads were established based on the results of the 6 minute walk test done at initial orientation visit.  The plan for exercise progression was also introduced and progression will be customized based on patient's performance and goals.                Exercise Goals and Review:   Exercise Goals     Row Name 10/05/22 1134             Exercise Goals   Increase Physical Activity Yes       Intervention Provide advice, education, support and counseling about physical activity/exercise needs.;Develop an individualized exercise prescription for aerobic and resistive training based on initial evaluation findings, risk stratification, comorbidities and participant's personal goals.       Expected Outcomes Short Term: Attend rehab on a regular basis to increase amount of physical activity.;Long Term: Add in home exercise to make exercise part of routine and to increase amount of physical activity.;Long Term: Exercising regularly at least 3-5 days a week.       Increase Strength and Stamina Yes       Intervention Provide advice, education, support and counseling about physical activity/exercise needs.;Develop an individualized exercise prescription for aerobic and resistive training based on initial evaluation findings, risk stratification, comorbidities and participant's personal goals.        Expected Outcomes Short Term: Increase workloads from initial exercise prescription for resistance, speed, and METs.;Short Term: Perform resistance training exercises routinely during rehab and add in resistance training at home;Long Term: Improve cardiorespiratory fitness, muscular endurance and strength as measured by increased METs and functional capacity ( )       Able to understand and use rate of perceived exertion (RPE) scale Yes       Intervention Provide education and explanation on how to use RPE scale       Expected Outcomes Short Term: Able to use RPE daily in rehab to express subjective intensity level;Long Term:  Able to use RPE to guide intensity level when exercising independently       Able to understand and use Dyspnea scale Yes       Intervention Provide education and explanation on how to use Dyspnea scale       Expected Outcomes Short Term: Able to use Dyspnea scale daily in rehab to express subjective sense of shortness of breath during exertion;Long Term: Able to use Dyspnea scale to guide intensity level when exercising independently       Knowledge and understanding of Target Heart Rate Range (THRR) Yes       Intervention Provide education and explanation of THRR including how the numbers were predicted and where they are located for reference       Expected Outcomes Short Term: Able to state/look up THRR;Long Term: Able to use THRR to govern intensity when exercising independently;Short Term: Able to use daily as guideline for intensity in rehab  Able to check pulse independently Yes       Intervention Provide education and demonstration on how to check pulse in carotid and radial arteries.;Review the importance of being able to check your own pulse for safety during independent exercise       Expected Outcomes Short Term: Able to explain why pulse checking is important during independent exercise;Long Term: Able to check pulse independently and accurately        Understanding of Exercise Prescription Yes       Intervention Provide education, explanation, and written materials on patient's individual exercise prescription       Expected Outcomes Short Term: Able to explain program exercise prescription;Long Term: Able to explain home exercise prescription to exercise independently                Exercise Goals Re-Evaluation :  Exercise Goals Re-Evaluation     Row Name 10/07/22 0828 10/13/22 1346 10/26/22 0746 11/10/22 1312 11/14/22 0743     Exercise Goal Re-Evaluation   Exercise Goals Review Able to understand and use rate of perceived exertion (RPE) scale;Knowledge and understanding of Target Heart Rate Range (THRR);Able to understand and use Dyspnea scale;Understanding of Exercise Prescription Increase Physical Activity;Increase Strength and Stamina;Understanding of Exercise Prescription Increase Physical Activity;Increase Strength and Stamina;Understanding of Exercise Prescription Increase Physical Activity;Increase Strength and Stamina;Understanding of Exercise Prescription Increase Physical Activity;Increase Strength and Stamina;Understanding of Exercise Prescription   Comments Reviewed RPE and dyspnea scale, THR and program prescription with pt today.  Pt voiced understanding and was given a copy of goals to take home. Blakely is off to a good start in the program. During his first session, he did well on the treadmill at a speed of 2.2 mph with an inlcine of 2%. He also did well on the biostep at level 4. We will continue to monitor his progress in the program. Bernette Redbird is doing well in rehab. He increased his treadmill workload to a speed of 2.9 mph with a 8% incline. He also improved to level 7 on the XR and level 6 on the T4 nustep. He continues to use 5 lb hand weights for resistance training as well. We will continue to monitor his progress in the program. Bernette Redbird continues to do well in rehab. He increased his treadmill workload but has been  inconsistent with his speed ranging from 2.6-3.2 mph and incline ranging from 4-10%. He also began using the elliptical at level 1 and continues to use the T4 nustep at level 5. We will continue to monitor his progress in the program. He continues to do well at rehab. working out at home as well getting exercise every day except for one, used to rest. climbing steps and doin gmore work at home, generally being more active.   Expected Outcomes Short: Use RPE daily to regulate intensity. Long: Follow program prescription in THR. Short: Continue to follow current exercise prescription. Long: Continue exercise to improve strength and stamina. Short: Continue to progressively increase treadmill workload. Long: Continue exercise to improve strength and stamina. Short: Consistently increase treadmill workload. Long: Continue exercise to improve strength and stamina. SHort: Continue to work here at rehab and at home. Long: increase strength and stanima    Row Name 11/22/22 1451 12/05/22 0809 12/07/22 0822         Exercise Goal Re-Evaluation   Exercise Goals Review Increase Physical Activity;Increase Strength and Stamina;Understanding of Exercise Prescription Increase Physical Activity;Increase Strength and Stamina;Able to understand and use rate of perceived exertion (  RPE) scale;Able to understand and use Dyspnea scale;Able to check pulse independently;Knowledge and understanding of Target Heart Rate Range (THRR);Understanding of Exercise Prescription Increase Physical Activity;Increase Strength and Stamina;Understanding of Exercise Prescription     Comments Bernette Redbird is doing well in rehab. He has stayed consistent with his workloads on the treadmill and T4 nustep. He improved from level 1 to level 6 on the elliptical. He also continues to use 5 lb hand weights for resistance training. We will continue to monitor his progress in the program. Reviewed home exercise with pt today.  Pt plans to walking, steps, and  weights for exercise.  Reviewed THR, pulse, RPE, sign and symptoms, pulse oximetery and when to call 911 or MD.  Also discussed weather considerations and indoor options.  Pt voiced understanding. Bernette Redbird is doing well in the program and is close to graduating. He recently completed his post and improved by 23.5%. He also increased his treadmill workload to a speed of 3 mph with an incline of 15% and has worked at level 5 on the T4 nustep. We will continue to monitor his progress until he graduates from the program.     Expected Outcomes Short: Increase to greater resistance with hand weights. Long: Continue to increase overall METs and stamina. Short: add 1-2 days per week of exercise on off days of rehab and graduate from cardiac rehab. Long: become independent with exercise routine. Short: Graduate. Long: Continue to exercise independently.              Discharge Exercise Prescription (Final Exercise Prescription Changes):  Exercise Prescription Changes - 12/07/22 0800       Response to Exercise   Blood Pressure (Admit) 102/60    Blood Pressure (Exit) 108/60    Heart Rate (Admit) 69 bpm    Heart Rate (Exercise) 129 bpm    Heart Rate (Exit) 66 bpm    Rating of Perceived Exertion (Exercise) 17    Symptoms none    Duration Continue with 30 min of aerobic exercise without signs/symptoms of physical distress.    Intensity THRR unchanged      Progression   Progression Continue to progress workloads to maintain intensity without signs/symptoms of physical distress.    Average METs 6.95      Resistance Training   Training Prescription Yes    Weight 5 lb    Reps 10-15      Interval Training   Interval Training No      Treadmill   MPH 3    Grade 15    Minutes 30    METs 9.5      NuStep   Level 5    Minutes 15    METs 6.1      Home Exercise Plan   Plans to continue exercise at Home (comment)   walk and steps.   Frequency Add 2 additional days to program exercise sessions.     Initial Home Exercises Provided 12/05/22      Oxygen   Maintain Oxygen Saturation 88% or higher             Nutrition:  Target Goals: Understanding of nutrition guidelines, daily intake of sodium 1500mg , cholesterol 200mg , calories 30% from fat and 7% or less from saturated fats, daily to have 5 or more servings of fruits and vegetables.  Education: All About Nutrition: -Group instruction provided by verbal, written material, interactive activities, discussions, models, and posters to present general guidelines for heart healthy nutrition including fat, fiber,  MyPlate, the role of sodium in heart healthy nutrition, utilization of the nutrition label, and utilization of this knowledge for meal planning. Follow up email sent as well. Written material given at graduation. Flowsheet Row Cardiac Rehab from 11/02/2022 in [facility] Cardiac and Pulmonary Rehab  Education need identified 10/05/22       Biometrics:  Pre Biometrics - 10/05/22 1135       Pre Biometrics   Height 5\' 9"  (1.753 m)    Weight 190 lb 14.4 oz (86.6 kg)    Waist Circumference 36 inches    Hip Circumference 40.5 inches    Waist to Hip Ratio 0.89 %    BMI (Calculated) 28.18    Single Leg Stand 30 seconds             Post Biometrics - 12/07/22 0748        Post  Biometrics   Height 5\' 9"  (1.753 m)    Weight 188 lb (85.3 kg)    Waist Circumference 35 inches    Hip Circumference 40.5 inches    Waist to Hip Ratio 0.86 %    BMI (Calculated) 27.75    Single Leg Stand 30 seconds             Nutrition Therapy Plan and Nutrition Goals:  Nutrition Therapy & Goals - 10/05/22 1320       Nutrition Therapy   Diet Carb controlled, Cardiac, low Na    Protein (specify units) 90    Fiber 30 grams    Whole Grain Foods 3 servings    Saturated Fats 15 max. grams    Fruits and Vegetables 5 servings/day    Sodium 2 grams      Personal Nutrition Goals   Nutrition Goal Watch carb portions and aim for ~30-60g  per meal    Personal Goal #2 Drink 48oz of water per day    Personal Goal #3 Shop for good snack ideas to help be consistent with eating during busy work hours    Comments Patient drinking 32oz of water daily. He was drinking a lot of sugary beverages, but has cut back on them since finding out about his DM dx. He works busy hours and often eats out. His food choices are not great, often carb heavy and full of sodium. Brainstormed some changes he could make to improve his meals. Recommended keeping carbs choices to one at meals, watching portions and not drinking sugary beverages. Reviewed mediterranean diet handout, educated on types of fats, sources, and how to read labels. Built out several meals and snacks with foods he likes and will eat, focusing on reducing saturated fat, sodium and controlling carbs per meal with goal of ~30-60g.      Intervention Plan   Intervention Prescribe, educate and counsel regarding individualized specific dietary modifications aiming towards targeted core components such as weight, hypertension, lipid management, diabetes, heart failure and other comorbidities.;Nutrition handout(s) given to patient.    Expected Outcomes Short Term Goal: Understand basic principles of dietary content, such as calories, fat, sodium, cholesterol and nutrients.;Short Term Goal: A plan has been developed with personal nutrition goals set during dietitian appointment.;Long Term Goal: Adherence to prescribed nutrition plan.             Nutrition Assessments:  MEDIFICTS Score Key: >=70 Need to make dietary changes  40-70 Heart Healthy Diet <= 40 Therapeutic Level Cholesterol Diet  Flowsheet Row Cardiac Rehab from 10/05/2022 in [facility] Cardiac and Pulmonary Rehab  Picture Your Plate  Total Score on Admission 39      Picture Your Plate Scores: <29 Unhealthy dietary pattern with much room for improvement. 41-50 Dietary pattern unlikely to meet recommendations for good health and room  for improvement. 51-60 More healthful dietary pattern, with some room for improvement.  >60 Healthy dietary pattern, although there may be some specific behaviors that could be improved.    Nutrition Goals Re-Evaluation:  Nutrition Goals Re-Evaluation     Row Name 10/17/22 0755 11/14/22 0751 12/14/22 0808         Goals   Comment He continues to check his BS, has been under 200 regularly which he is happy about. Spoke abut sodium in frozen meals. Saturated fat, and goal of less than ~15g per day. Overall, he reports he is doing better with nutrtion since meeting with RD He continue s to watch his BS. Usually under 180. his lows are around 110s. He is eating more greek yogurt and focusing on more balanced meals. Eating less fast food. He reports he continues to work on eating habits. sometimes splurges but not like he used to. He is close to cutting out sweets. still drinking water and limiting dark sodas, using sugar free drinks most of the time. He continues to check his BS, which he reports have been consitently good.     Expected Outcome Short: continue to check BS, keep carbs portions more controlled. Long: lower A1C, and build heart healthy eating habits. Short" COntinue to check BS, keep under 180. Long: lower A1C and chooses heart healthy foods Short: continue to check BS. Long: Cut out sweets and sugary beverages to lower A1C and maintain hearth healthy lifestyle              Nutrition Goals Discharge (Final Nutrition Goals Re-Evaluation):  Nutrition Goals Re-Evaluation - 12/14/22 0808       Goals   Comment He reports he continues to work on eating habits. sometimes splurges but not like he used to. He is close to cutting out sweets. still drinking water and limiting dark sodas, using sugar free drinks most of the time. He continues to check his BS, which he reports have been consitently good.    Expected Outcome Short: continue to check BS. Long: Cut out sweets and sugary  beverages to lower A1C and maintain hearth healthy lifestyle             Psychosocial: Target Goals: Acknowledge presence or absence of significant depression and/or stress, maximize coping skills, provide positive support system. Participant is able to verbalize types and ability to use techniques and skills needed for reducing stress and depression.   Education: Stress, Anxiety, and Depression - Group verbal and visual presentation to define topics covered.  Reviews how body is impacted by stress, anxiety, and depression.  Also discusses healthy ways to reduce stress and to treat/manage anxiety and depression.  Written material given at graduation.   Education: Sleep Hygiene -Provides group verbal and written instruction about how sleep can affect your health.  Define sleep hygiene, discuss sleep cycles and impact of sleep habits. Review good sleep hygiene tips.    Initial Review & Psychosocial Screening:  Initial Psych Review & Screening - 09/27/22 1325       Initial Review   Current issues with Current Sleep Concerns;Current Stress Concerns;Current Anxiety/Panic    Source of Stress Concerns Financial      Family Dynamics   Good Support System? Yes      Barriers  Psychosocial barriers to participate in program There are no identifiable barriers or psychosocial needs.;The patient should benefit from training in stress management and relaxation.      Screening Interventions   Interventions Encouraged to exercise;Provide feedback about the scores to participant;To provide support and resources with identified psychosocial needs    Expected Outcomes Long Term Goal: Stressors or current issues are controlled or eliminated.;Short Term goal: Utilizing psychosocial counselor, staff and physician to assist with identification of specific Stressors or current issues interfering with healing process. Setting desired goal for each stressor or current issue identified.;Short Term goal:  Identification and review with participant of any Quality of Life or Depression concerns found by scoring the questionnaire.;Long Term goal: The participant improves quality of Life and PHQ9 Scores as seen by post scores and/or verbalization of changes             Quality of Life Scores:   Quality of Life - 10/05/22 1111       Quality of Life   Select Quality of Life      Quality of Life Scores   Health/Function Pre 16.3 %    Socioeconomic Pre 20 %    Psych/Spiritual Pre 15.43 %    Family Pre 13.7 %    GLOBAL Pre 16.6 %            Scores of 19 and below usually indicate a poorer quality of life in these areas.  A difference of  2-3 points is a clinically meaningful difference.  A difference of 2-3 points in the total score of the Quality of Life Index has been associated with significant improvement in overall quality of life, self-image, physical symptoms, and general health in studies assessing change in quality of life.  PHQ-9: Review Flowsheet       10/20/2022 10/19/2022 10/05/2022 09/22/2022  Depression screen PHQ 2/9  Decreased Interest 0 0 0 0 0  Down, Depressed, Hopeless 0 0 1 2 0  PHQ - 2 Score 0 0 1 2 0  Altered sleeping 0 1 2 -  Tired, decreased energy 0 1 2 -  Change in appetite 0 1 3 -  Feeling bad or failure about yourself  0 0 0 -  Trouble concentrating 0 0 0 -  Moving slowly or fidgety/restless 0 0 3 -  Suicidal thoughts 0 0 0 -  PHQ-9 Score 0 4 12 -  Difficult doing work/chores Not difficult at all Not difficult at all Somewhat difficult -    Details       Multiple values from one day are sorted in reverse-chronological order        Interpretation of Total Score  Total Score Depression Severity:  1-4 = Minimal depression, 5-9 = Mild depression, 10-14 = Moderate depression, 15-19 = Moderately severe depression, 20-27 = Severe depression   Psychosocial Evaluation and Intervention:  Psychosocial Evaluation - 09/27/22 1335       Psychosocial  Evaluation & Interventions   Interventions Encouraged to exercise with the program and follow exercise prescription;Stress management education;Relaxation education    Comments 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 He was also newly diagnosed with diabetes. He admits it is taking time to get used to all the new medication and lifestyle changes he is having to make. He has met with a dietitian and is very interested in meeting with the program's dietitian to continue learning what he should be eating. He plans on returning to work in November,  as it is physically demanding so he had to wait until then. He lives by himself and states he has a support system, but usually he is the one helping others and it is hard for him to reach out when he needs something. He has had issues with sleep for a long time, and recently noticed it has changed patterns. He is thinking this might be related to all the new medications. He has been shopping around for a blood pressure cuff and has found a glucometer he likes. He states he is very motivated to get started in the program because he wants to take care of his health.    Expected Outcomes Short: attend cardiac rehab for education and exercise. Long: develop and maintain positive self care habits    Continue Psychosocial Services  Follow up required by staff             Psychosocial Re-Evaluation:  Psychosocial Re-Evaluation     Row Name 10/19/22 [phone] 11/14/22 0746 12/14/22 0806         Psychosocial Re-Evaluation   Current issues with Current Stress Concerns;Current Psychotropic Meds Current Psychotropic Meds;Current Stress Concerns None Identified     Comments Reviewed patient health questionnaire (PHQ-9) with patient for follow up. Previously, patients score indicated signs/symptoms of depression.  Reviewed to see if patient is improving symptom wise while in program.  Score improved and patient states that it is because he is feeling  better since his heart event. Work can cause some stress and anxiety. he is mindful of what he can control and is determined to stay mentally positive. Continues to take his medication. He reports no anxiety or stressor outside the ordinary. He practices deep breaths to help calm and stay positive mindset     Expected Outcomes Short: Continue to attend [facility] regularly for regular exercise and social engagement. Long: Continue to improve symptoms and manage a positive mental state. Short: continue to attend rehab and focus on self when at work. Long: maintain and use stress reliving techniques to stay mentally postive on life and outcomes. Short: Continue to use support system and stress relieving techniques. Long: maintain positive mindset of life and health outcomes     Interventions Encouraged to attend Cardiac Rehabilitation for the exercise Encouraged to attend Cardiac Rehabilitation for the exercise Encouraged to attend Cardiac Rehabilitation for the exercise     Continue Psychosocial Services  Follow up required by staff Follow up required by staff Follow up required by staff              Psychosocial Discharge (Final Psychosocial Re-Evaluation):  Psychosocial Re-Evaluation - 12/14/22 0806       Psychosocial Re-Evaluation   Current issues with None Identified    Comments He reports no anxiety or stressor outside the ordinary. He practices deep breaths to help calm and stay positive mindset    Expected Outcomes Short: Continue to use support system and stress relieving techniques. Long: maintain positive mindset of life and health outcomes    Interventions Encouraged to attend Cardiac Rehabilitation for the exercise    Continue Psychosocial Services  Follow up required by staff             Vocational Rehabilitation: Provide vocational rehab assistance to qualifying candidates.   Vocational Rehab Evaluation & Intervention:  Vocational Rehab - 09/27/22 1325       Initial  Vocational Rehab Evaluation & Intervention   Assessment shows need for Vocational Rehabilitation No  Education: Education Goals: Education classes will be provided on a variety of topics geared toward better understanding of heart health and risk factor modification. Participant will state understanding/return demonstration of topics presented as noted by education test scores.  Learning Barriers/Preferences:  Learning Barriers/Preferences - 09/27/22 1324       Learning Barriers/Preferences   Learning Barriers None    Learning Preferences None             General Cardiac Education Topics:  AED/CPR: - Group verbal and written instruction with the use of models to demonstrate the basic use of the AED with the basic ABC's of resuscitation.   Anatomy and Cardiac Procedures: - Group verbal and visual presentation and models provide information about basic cardiac anatomy and function. Reviews the testing methods done to diagnose heart disease and the outcomes of the test results. Describes the treatment choices: Medical Management, Angioplasty, or Coronary Bypass Surgery for treating various heart conditions including Myocardial Infarction, Angina, Valve Disease, and Cardiac Arrhythmias.  Written material given at graduation.   Medication Safety: - Group verbal and visual instruction to review commonly prescribed medications for heart and lung disease. Reviews the medication, class of the drug, and side effects. Includes the steps to properly store meds and maintain the prescription regimen.  Written material given at graduation.   Intimacy: - Group verbal instruction through game format to discuss how heart and lung disease can affect sexual intimacy. Written material given at graduation.. Flowsheet Row Cardiac Rehab from 11/02/2022 in [facility] Cardiac and Pulmonary Rehab  Date 11/02/22  Educator NT  Instruction Review Code 1- Verbalizes Understanding       Know  Your Numbers and Heart Failure: - Group verbal and visual instruction to discuss disease risk factors for cardiac and pulmonary disease and treatment options.  Reviews associated critical values for Overweight/Obesity, Hypertension, Cholesterol, and Diabetes.  Discusses basics of heart failure: signs/symptoms and treatments.  Introduces Heart Failure Zone chart for action plan for heart failure.  Written material given at graduation. Flowsheet Row Cardiac Rehab from 11/02/2022 in [facility] Cardiac and Pulmonary Rehab  Education need identified 10/05/22       Infection Prevention: - Provides verbal and written material to individual with discussion of infection control including proper hand washing and proper equipment cleaning during exercise session. Flowsheet Row Cardiac Rehab from 11/02/2022 in [facility] Cardiac and Pulmonary Rehab  Date 10/05/22  Educator NT  Instruction Review Code 1- Verbalizes Understanding       Falls Prevention: - Provides verbal and written material to individual with discussion of falls prevention and safety. Flowsheet Row Cardiac Rehab from 11/02/2022 in [facility] Cardiac and Pulmonary Rehab  Date 10/05/22  Educator NT  Instruction Review Code 1- Verbalizes Understanding       Other: -Provides group and verbal instruction on various topics (see comments)   Knowledge Questionnaire Score:  Knowledge Questionnaire Score - 10/05/22 1111       Knowledge Questionnaire Score   Pre Score 21/26             Core Components/Risk Factors/Patient Goals at Admission:  Personal Goals and Risk Factors at Admission - 09/27/22 1316       Core Components/Risk Factors/Patient Goals on Admission   Diabetes Yes    Intervention Provide education about signs/symptoms and action to take for hypo/hyperglycemia.;Provide education about proper nutrition, including hydration, and aerobic/resistive exercise prescription along with prescribed medications to achieve blood glucose in  normal ranges: Fasting glucose 65-99 mg/dL  Expected Outcomes Short Term: Participant verbalizes understanding of the signs/symptoms and immediate care of hyper/hypoglycemia, proper foot care and importance of medication, aerobic/resistive exercise and nutrition plan for blood glucose control.;Long Term: Attainment of HbA1C < 7%.    Lipids Yes    Intervention Provide education and support for participant on nutrition & aerobic/resistive exercise along with prescribed medications to achieve LDL 70mg , HDL >40mg .    Expected Outcomes Short Term: Participant states understanding of desired cholesterol values and is compliant with medications prescribed. Participant is following exercise prescription and nutrition guidelines.;Long Term: Cholesterol controlled with medications as prescribed, with individualized exercise RX and with personalized nutrition plan. Value goals: LDL < 70mg , HDL > 40 mg.             Education:Diabetes - Individual verbal and written instruction to review signs/symptoms of diabetes, desired ranges of glucose level fasting, after meals and with exercise. Acknowledge that pre and post exercise glucose checks will be done for 3 sessions at entry of program. Flowsheet Row Cardiac Rehab from 11/02/2022 in [facility] Cardiac and Pulmonary Rehab  Date 09/27/22  Educator [facility]  Instruction Review Code 1- Verbalizes Understanding       Core Components/Risk Factors/Patient Goals Review:   Goals and Risk Factor Review     Row Name 10/19/22 0814 11/14/22 0753 12/14/22 0812         Core Components/Risk Factors/Patient Goals Review   Personal Goals Review Diabetes;Weight Management/Obesity Weight Management/Obesity;Diabetes Weight Management/Obesity;Diabetes     Review Rocky Link is wanting to maintain his weight. He is checking his sugars at home. His blood sugar has been below 200 routinely. He has no questions about any of his medications at this time. He continue s to take his meds and  controll his BS, most often between 110-180. Is hopeful that he could come off some medication He reports no questions at this time. He has lost some weight, reports he used to be around 170lbs, but is happy with the 180s he is in currently. Spoke with him about his A1C which has come down significantly from reported 7.1% to 6.2%. Has switched to jardiance medications. He is very happy with the improvements he has seen from rehab     Expected Outcomes Short: continue to monitor blood sugar levels. Long: maintain blood sugar levels independently. Short: continue to check BS, speak with Dr about medications. Long: Maintain and control BS independently. Short: continue to check BS and work with Dr with new medications. Long: Improve A1C, control BS independently and maintain happy healthy weight of 170-180lbs.              Core Components/Risk Factors/Patient Goals at Discharge (Final Review):   Goals and Risk Factor Review - 12/14/22 0812       Core Components/Risk Factors/Patient Goals Review   Personal Goals Review Weight Management/Obesity;Diabetes    Review He has lost some weight, reports he used to be around 170lbs, but is happy with the 180s he is in currently. Spoke with him about his A1C which has come down significantly from reported 7.1% to 6.2%. Has switched to jardiance medications. He is very happy with the improvements he has seen from rehab    Expected Outcomes Short: continue to check BS and work with Dr with new medications. Long: Improve A1C, control BS independently and maintain happy healthy weight of 170-180lbs.             ITP Comments:  ITP Comments     Row Name 09/27/22  1311 10/05/22 1105 10/07/22 0827 11/02/22 1038 11/23/22 0958   ITP Comments Initial phone call completed. Diagnosis can be found in [facility] 9/10. EP Orientation scheduled for Wednesday 10/2 8am. Completed and gym orientation. Initial ITP created and sent for review to Dr. Bethann Punches, Medical  Director. First full day of exercise!  Patient was oriented to gym and equipment including functions, settings, policies, and procedures.  Patient's individual exercise prescription and treatment plan were reviewed.  All starting workloads were established based on the results of the 6 minute walk test done at initial orientation visit.  The plan for exercise progression was also introduced and progression will be customized based on patient's performance and goals. 30 Day review completed. Medical Director ITP review done, changes made as directed, and signed approval by Medical Director. new to program 30 Day review completed. Medical Director ITP review done, changes made as directed, and signed approval by Medical Director.    Row Name 12/16/22 0842           ITP Comments Palani graduated today from  rehab with 36 sessions completed.  Details of the patient's exercise prescription and what He needs to do in order to continue the prescription and progress were discussed with patient.  Patient was given a copy of prescription and goals.  Patient verbalized understanding. Nicolas plans to continue to exercise by walking and steps.                Comments: Discharge ITP

## 2022-12-16 NOTE — Progress Notes (Signed)
Daily Session Note  Patient Details  Name: Aaron Bowers MRN: [id number] Date of Birth: 14-Nov-1970 Referring Provider:   Flowsheet Row Cardiac Rehab from 10/05/2022 in [facility] Cardiac and Pulmonary Rehab  Referring Provider Dr. [name], MD       Encounter Date: 12/16/2022  Check In:  Session Check In - 12/16/22 0840       Check-In   Supervising physician immediately available to respond to emergencies See telemetry face sheet for immediately available ER MD    Location [facility]-Cardiac & Pulmonary Rehab    Staff Present Elige Ko, RCP,RRT,BSRT;Noah Tickle, BS, Exercise Physiologist;Aamira Bischoff Katrinka Blazing, RN, ADN    Virtual Visit No    Medication changes reported     No    Fall or balance concerns reported    No    Warm-up and Cool-down Performed on first and last piece of equipment    Resistance Training Performed Yes    VAD Patient? No    PAD/SET Patient? No      Pain Assessment   Currently in Pain? No/denies                Social History   Tobacco Use  Smoking Status Never   Passive exposure: Past  Smokeless Tobacco Never    Goals Met:  Independence with exercise equipment Exercise tolerated well No report of concerns or symptoms today Strength training completed today  Goals Unmet:  Not Applicable  Comments:  Revere graduated today from  rehab with 36 sessions completed.  Details of the patient's exercise prescription and what He needs to do in order to continue the prescription and progress were discussed with patient.  Patient was given a copy of prescription and goals.  Patient verbalized understanding. Markian plans to continue to exercise by walking and steps.    Dr. Bethann Punches is Medical Director for [facility] Cardiac Rehabilitation.  Dr. Vida Rigger is Medical Director for [facility] Pulmonary Rehabilitation.

## 2022-12-19 ENCOUNTER — Ambulatory Visit: Payer: 59 | Attending: Physician Assistant | Admitting: Physician Assistant

## 2022-12-19 ENCOUNTER — Encounter: Payer: Self-pay | Admitting: Physician Assistant

## 2022-12-19 VITALS — BP 110/68 | HR 66 | Ht 68.0 in | Wt 198.4 lb

## 2022-12-19 DIAGNOSIS — I214 Non-ST elevation (NSTEMI) myocardial infarction: Secondary | ICD-10-CM

## 2022-12-19 DIAGNOSIS — I2510 Atherosclerotic heart disease of native coronary artery without angina pectoris: Secondary | ICD-10-CM | POA: Diagnosis not present

## 2022-12-19 DIAGNOSIS — I5032 Chronic diastolic (congestive) heart failure: Secondary | ICD-10-CM | POA: Diagnosis not present

## 2022-12-19 DIAGNOSIS — I255 Ischemic cardiomyopathy: Secondary | ICD-10-CM | POA: Diagnosis not present

## 2022-12-19 DIAGNOSIS — E119 Type 2 diabetes mellitus without complications: Secondary | ICD-10-CM

## 2022-12-19 DIAGNOSIS — E785 Hyperlipidemia, unspecified: Secondary | ICD-10-CM

## 2022-12-19 MED ORDER — FUROSEMIDE 20 MG PO TABS: 20.0000 mg | ORAL_TABLET | ORAL | 3 refills | Status: DC | PRN

## 2022-12-19 NOTE — Progress Notes (Signed)
Cardiology Office Note    Date:  12/19/2022   ID:  Aaron Bowers, DOB 1970-11-04, MRN [id number]  PCP:  [name], FNP  Cardiologist:  [name], MD  Electrophysiologist:  None   Chief Complaint: Follow-up  History of Present Illness:   Aaron Bowers is a 52 y.o. 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   He was admitted to the hospital from 9/10 through 09/15/2022 after having progressive intermittent exertional chest discomfort with strenuous activities over the preceding several months and was found to have an NSTEMI. High-sensitivity troponin [place] at [address].  LHC showed severe single-vessel CAD with 99% stenosis of the mid LAD involving the ostium of a moderate caliber D2 branch.  There was no significant disease noted in the dominant LCx or small RCA.  Moderately to severely reduced LV systolic function with an EF of 30 to 35% with normal filling pressure with an LVEDP of 10 mmHg.  He underwent successful PCI/DES to the mid LAD and angioplasty of jailed D2.  Final angiogram showed 0% residual stenosis in the LAD with 30% residual stenosis at the ostium of D2 with TIMI-3 flow throughout the left coronary artery.  Post-intervention echo showed an EF of 35 to 40% with wall motion abnormality involving the mid to distal anteroseptal, anterior, periapical, and mid to distal lateral walls with the basal regions best preserved, normal RV systolic function, ventricular cavity size, and RVSP, moderate mitral regurgitation, and an estimated right atrial pressure of 3 mmHg.  He was seen in hospital follow-up on 09/22/2022 and was without symptoms of angina or cardiac decompensation.  He did note a couple episodes of short-lived chest discomfort that would last for less than 1 minute  and spontaneously resolved.  Symptoms did not feel similar to his prior angina.  He was working with a Data processing manager.  He was adherent and tolerating cardiac medications.  He remained out of work given strenuous job function.  He was started on losartan 12.5 mg daily with continuation of Toprol-XL 12.5 mg and furosemide 20 mg every other day with KCl 10 mEq every other day.  He was seen in the office on 10/21/2022 and remained without symptoms of angina or cardiac decompensation.  He reported a couple episodes of of chest discomfort that would last a few seconds that would spontaneously resolve and did not feel similar to prior angina.  He also noted some positional dizziness.  He was eager to get back to work.  He was participating with cardiac rehab.  Symptoms of positional dizziness and relative hypotension precluded escalation of GDMT.  He was last seen in the office on 11/18/2022 and remained without symptoms of angina or cardiac decompensation.  He continued to note rare split-second occurring episodes of left-sided chest comfort that did not feel similar to prior angina.  To further optimize GDMT, he was started on Farxiga.  Echo on 12/16/2022 showed improvement in LV systolic function with an EF of 55 to 60%, no regional wall motion abnormalities, normal LV diastolic function parameters, normal RV systolic function, and no significant valvular abnormalities.  He comes in doing well from a cardiac perspective and is without symptoms of angina or cardiac decompensation.  Remains active at baseline without dizziness, presyncope, or syncope.  No falls, hematochezia,  or melena.  Adherent and tolerating cardiac medications without issues.  Does notice some intermittent fatigue.  Has graduated from cardiac rehab.   Labs independently reviewed: 12/2022 - BUN 8, serum creatinine 0.84, potassium 4.5, albumin 4.7, AST/ALT normal, A1c 6.2, TC 88, TG 83, HDL 35, LDL 36 09/2022 - LP(a) 96.2, Hgb 16.0, PLT 347  Past  Medical History:  Diagnosis Date   Bipolar disorder (HCC)    Borderline hyperlipidemia    CAD (coronary artery disease)    Diabetes mellitus (HCC)    Hyperlipemia     Past Surgical History:  Procedure Laterality Date   COLONOSCOPY WITH PROPOFOL N/A 05/13/2020   Procedure: COLONOSCOPY WITH PROPOFOL;  Surgeon: Toney Reil, MD;  Location: [facility] ENDOSCOPY;  Service: Gastroenterology;  Laterality: N/A;   CORONARY STENT INTERVENTION N/A 09/13/2022   Procedure: CORONARY STENT INTERVENTION;  Surgeon: [name], MD;  Location: [facility] INVASIVE CV LAB;  Service: Cardiovascular;  Laterality: N/A;   ESOPHAGOGASTRODUODENOSCOPY (EGD) WITH PROPOFOL N/A 05/13/2020   Procedure: ESOPHAGOGASTRODUODENOSCOPY (EGD) WITH PROPOFOL;  Surgeon: Toney Reil, MD;  Location: [facility] ENDOSCOPY;  Service: Gastroenterology;  Laterality: N/A;   LEFT HEART CATH AND CORONARY ANGIOGRAPHY N/A 09/13/2022   Procedure: LEFT HEART CATH AND CORONARY ANGIOGRAPHY;  Surgeon: [name], MD;  Location: [facility] INVASIVE CV LAB;  Service: Cardiovascular;  Laterality: N/A;    Current Medications: Current Meds  Medication Sig   aspirin (ASPIRIN LOW DOSE) 81 MG chewable tablet CHEW 1 TABLET BY MOUTH DAILY.   atorvastatin (LIPITOR) 80 MG tablet TAKE 1 TABLET BY MOUTH EVERY DAY   divalproex (DEPAKOTE ER) 500 MG 24 hr tablet Take 1 tablet (500 mg total) by mouth 3 (three) times daily.   empagliflozin (JARDIANCE) 10 MG TABS tablet Take 1 tablet (10 mg total) by mouth daily before breakfast.   ezetimibe (ZETIA) 10 MG tablet Take 1 tablet (10 mg total) by mouth daily.   lamoTRIgine (LAMICTAL) 150 MG tablet Take 150 mg by mouth 2 (two) times daily.   losartan (COZAAR) 25 MG tablet Take 0.5 tablets (12.5 mg total) by mouth daily.   metFORMIN (GLUCOPHAGE-XR) 500 MG 24 hr tablet Take 1 tablet (500 mg total) by mouth 2 (two) times daily with a meal.   metoprolol succinate (TOPROL-XL) 25 MG 24 hr tablet TAKE 0.5 TABLETS BY MOUTH AT  BEDTIME.   potassium chloride (KLOR-CON M) 10 MEQ tablet Take 1 tablet (10 mEq total) by mouth every other day. Take only when taking Lasix (furosemide).   prasugrel (EFFIENT) 10 MG TABS tablet Take 1 tablet (10 mg total) by mouth daily.   [DISCONTINUED] furosemide (LASIX) 20 MG tablet Take 1 tablet (20 mg total) by mouth every other day.    Allergies:   Patient has no known allergies.   Social History   Socioeconomic History   Marital status: Single    Spouse name: Not on file   Number of children: 3   Years of education: Not on file   Highest education level: 12th grade  Occupational History   Not on file  Tobacco Use   Smoking status: Never    Passive exposure: Past   Smokeless tobacco: Never  Vaping Use   Vaping status: Never Used  Substance and Sexual Activity   Alcohol use: Never   Drug use: Never   Sexual activity: Not Currently  Other Topics Concern   Not on file  Social History Narrative   Not on file   Social Drivers of Health   Financial  Resource Strain: High Risk (12/17/2022)   Overall Financial Resource Strain (CARDIA)    Difficulty of Paying Living Expenses: Very hard  Food Insecurity: Food Insecurity Present (12/17/2022)   Hunger Vital Sign    Worried About Running Out of Food in the Last Year: Often true    Ran Out of Food in the Last Year: Often true  Transportation Needs: No Transportation Needs (12/17/2022)   PRAPARE - Administrator, Civil Service (Medical): No    Lack of Transportation (Non-Medical): No  Physical Activity: Insufficiently Active (12/17/2022)   Exercise Vital Sign    Days of Exercise per Week: 4 days    Minutes of Exercise per Session: 20 min  Stress: Stress Concern Present (12/17/2022)   [employer] of Occupational Health - Occupational Stress Questionnaire    Feeling of Stress : To some extent  Social Connections: Moderately Integrated (12/17/2022)   Social Connection and Isolation Panel [NHANES]     Frequency of Communication with Friends and Family: Three times a week    Frequency of Social Gatherings with Friends and Family: Twice a week    Attends Religious Services: 1 to 4 times per year    Active Member of [employer] or Organizations: Yes    Attends Engineer, structural: More than 4 times per year    Marital Status: Separated     Family History:  The patient's family history includes Asthma in his father; Deep vein thrombosis in his mother; Heart disease in his mother; Parkinson's disease in his father.  ROS:   12-point review of systems is negative unless otherwise noted in the HPI.   EKGs/Labs/Other Studies Reviewed:    Studies reviewed were summarized above. The additional studies were reviewed today:  Limited echo 12/16/2022: 1. Left ventricular ejection fraction, by estimation, is 55 to 60%. Left  ventricular ejection fraction by 3D volume is 55 %. Left ventricular  ejection fraction by 2D MOD biplane is 54.1 %. The left ventricle has  normal function. The left ventricle has  no regional wall motion abnormalities. Left ventricular diastolic  parameters were normal.   2. Right ventricular systolic function is normal.   3. The mitral valve is normal in structure. No evidence of mitral valve  regurgitation.   4. The aortic valve is tricuspid. Aortic valve regurgitation is not  visualized.  __________  2D echo 09/14/2022: 1. Left ventricular ejection fraction, by estimation, is 35 to 40%. Left  ventricular ejection fraction by 3D volume is 45 %. The left ventricle has  moderately decreased function. The left ventricle demonstrates regional  wall motion abnormalities (mid to   distal anteroroseptal, anterior, periapical, mid to distal lateral walls). Basal regions best preserved. Left ventricular diastolic parameters are  indeterminate. The average left ventricular global longitudinal strain is  -6.7 %. The global longitudinal strain is abnormal.   2. Right  ventricular systolic function is normal. The right ventricular  size is normal. There is normal pulmonary artery systolic pressure. The  estimated right ventricular systolic pressure is 32.8 mmHg.   3. The mitral valve is normal in structure. Moderate mitral valve  regurgitation. No evidence of mitral stenosis.   4. The aortic valve is tricuspid. Aortic valve regurgitation is not  visualized. No aortic stenosis is present.   5. The inferior vena cava is normal in size with greater than 50%  respiratory variability, suggesting right atrial pressure of 3 mmHg.  __________   LHC 09/13/2022: Conclusions: Severe single-vessel coronary artery disease  with 99% stenosis of mid LAD involving ostium of moderate-caliber D2 branch.  No significant disease noted in dominant LCx or small RCA. Moderately to severely reduced left ventricular systolic function (LVEF 30-35%) with normal filling pressure (LVEDP 10 mmHg). Successful PCI to mid LAD using Onyx Frontier 2.75 x 38 mm drug-eluting stent (postdilated to 3.1 mm) and angioplasty of jailed D2 branch using 2.0 mm balloon.  Final angiogram shows 0% residual stenosis in the LAD and 30% residual stenosis at the ostium of D2.  There is TIMI-3 flow throughout the left coronary artery.   Recommendations: Continue tirofiban infusion for 4 hours. Dual antiplatelet therapy with aspirin and prasugrel for at least 12 months. Aggressive secondary prevention of coronary artery disease. Obtain echocardiogram.  If LVEF confirmed to be less than 35%, consider LifeVest at discharge.   EKG:  EKG is ordered today.  The EKG ordered today demonstrates NSR, 66 bpm, improving anterolateral ST-T changes  Recent Labs: 09/29/2022: Hemoglobin 16.0; Platelets 347 12/05/2022: ALT 33; BUN 8; Creatinine, Ser 0.84; Potassium 4.5; Sodium 142  Recent Lipid Panel    Component Value Date/Time   CHOL 88 (L) 12/05/2022 0854   TRIG 83 12/05/2022 0854   HDL 35 (L) 12/05/2022 0854    CHOLHDL 2.5 12/05/2022 0854   CHOLHDL 7.2 09/14/2022 0247   VLDL 53 (H) 09/14/2022 0247   LDLCALC 36 12/05/2022 0854    PHYSICAL EXAM:    VS:  BP 110/68 (BP Location: Left Arm, Patient Position: Sitting, Cuff Size: Normal)   Pulse 66   Ht 5\' 8"  (1.727 m)   Wt 198 lb 6.4 oz (90 kg)   SpO2 98%   BMI 30.17 kg/m   BMI: Body mass index is 30.17 kg/m.  Physical Exam Vitals reviewed.  Constitutional:      Appearance: He is well-developed.  HENT:     Head: Normocephalic and atraumatic.  Eyes:     General:        Right eye: No discharge.        Left eye: No discharge.  Neck:     Vascular: No JVD.  Cardiovascular:     Rate and Rhythm: Normal rate and regular rhythm.     Heart sounds: Normal heart sounds, S1 normal and S2 normal. Heart sounds not distant. No midsystolic click and no opening snap. No murmur heard.    No friction rub.  Pulmonary:     Effort: Pulmonary effort is normal. No respiratory distress.     Breath sounds: Normal breath sounds. No decreased breath sounds, wheezing, rhonchi or rales.  Chest:     Chest wall: No tenderness.  Abdominal:     General: There is no distension.  Musculoskeletal:     Cervical back: Normal range of motion.  Skin:    General: Skin is warm and dry.     Nails: There is no clubbing.  Neurological:     Mental Status: He is alert and oriented to person, place, and time.  Psychiatric:        Speech: Speech normal.        Behavior: Behavior normal.        Thought Content: Thought content normal.        Judgment: Judgment normal.     Wt Readings from Last 3 Encounters:  12/19/22 198 lb 6.4 oz (90 kg)  12/07/22 188 lb (85.3 kg)  11/18/22 191 lb (86.6 kg)     ASSESSMENT & PLAN:   CAD involving the native coronary arteries  with high risk NSTEMI without angina: He continues to do well and is without symptoms of angina or cardiac decompensation.  Continue DAPT with aspirin 81 mg and Effient 10 mg daily without interruption for a  minimum of 12 months dating back to date of PCI (09/13/2022).  He will otherwise continue aggressive risk factor modification and secondary prevention including atorvastatin, ezetimibe, and Toprol-XL.  No indication for further ischemic testing at this time.  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  Transition furosemide to as needed for increased shortness of breath or weight gain greater than 3 pounds overnight.  He will otherwise continue losartan 12.5 mg, Toprol-XL 12.5 mg Jardiance 10 mg.  With normalization of LV systolic function, and in the context of no heart failure symptoms, defer further escalation of GDMT at this time.  HLD: LDL improved from 140 to 36.  He remains on atorvastatin and ezetimibe.  Recently diagnosed diabetes: A1c 6.2.  On Farxiga as outlined above.  Ongoing management per PCP.    Disposition: F/u with Dr. Okey Dupre or an APP in 3 months.   Medication Adjustments/Labs and Tests Ordered: Current medicines are reviewed at length with the patient today.  Concerns regarding medicines are outlined above. Medication changes, Labs and Tests ordered today are summarized above and listed in the Patient Instructions accessible in Encounters.   Signed, Eula Listen, PA-C 12/19/2022 9:46 AM     [facility] [place] [address] [place], [place] [address] [phone]

## 2022-12-19 NOTE — Patient Instructions (Signed)
Medication Instructions:  Your physician recommends the following medication changes.  START TAKING: Lasix as needed for weight gain greater than 3 pounds in 1 day   *If you need a refill on your cardiac medications before your next appointment, please call your pharmacy*   Lab Work: None ordered at this time    Follow-Up: At [facility], you and your health needs are our priority.  As part of our continuing mission to provide you with exceptional heart care, we have created designated Provider Care Teams.  These Care Teams include your primary Cardiologist (physician) and Advanced Practice Providers (APPs -  Physician Assistants and Nurse Practitioners) who all work together to provide you with the care you need, when you need it.  We recommend signing up for the patient portal called "MyChart".  Sign up information is provided on this After Visit Summary.  MyChart is used to connect with patients for Virtual Visits (Telemedicine).  Patients are able to view lab/test results, encounter notes, upcoming appointments, etc.  Non-urgent messages can be sent to your provider as well.   To learn more about what you can do with MyChart, go to [email or web address].    Your next appointment:   3 month(s)  Provider:   You may see [name], MD or one of the following Advanced Practice Providers on your designated Care Team:   Eula Listen, [place]

## 2022-12-20 NOTE — Progress Notes (Unsigned)
   There were no vitals taken for this visit.   Subjective:    Patient ID: Aaron Bowers, male    DOB: Apr 03, 1970, 52 y.o.   MRN: [id number]  HPI: Aaron Bowers is a 52 y.o. male  No chief complaint on file.   Discussed the use of AI scribe software for clinical note transcription with the patient, who gave verbal consent to proceed.  History of Present Illness           12/16/2022    8:46 AM 10/20/2022   10:13 AM 10/20/2022   10:02 AM  Depression screen PHQ 2/9  Decreased Interest 0 0 0  Down, Depressed, Hopeless 1 0 0  PHQ - 2 Score 1 0 0  Altered sleeping 1 0   Tired, decreased energy 2 0   Change in appetite 0 0   Feeling bad or failure about yourself  0 0   Trouble concentrating 0 0   Moving slowly or fidgety/restless 0 0   Suicidal thoughts 0 0   PHQ-9 Score 4 0   Difficult doing work/chores Somewhat difficult Not difficult at all     Relevant past medical, surgical, family and social history reviewed and updated as indicated. Interim medical history since our last visit reviewed. Allergies and medications reviewed and updated.  Review of Systems  Per HPI unless specifically indicated above     Objective:    There were no vitals taken for this visit.  {Vitals History (Optional):23777} Wt Readings from Last 3 Encounters:  12/19/22 198 lb 6.4 oz (90 kg)  12/07/22 188 lb (85.3 kg)  11/18/22 191 lb (86.6 kg)    Physical Exam  Results for orders placed or performed in visit on 12/16/22  ECHOCARDIOGRAM LIMITED   Collection Time: 12/16/22  9:51 AM  Result Value Ref Range   S' Lateral 3.30 cm   Area-P 1/2 3.19 cm2   Single Plane A4C EF 53.1 %   Single Plane A2C EF 52.7 %   Calc EF 54.1 %   Est EF 55 - 60%    {Labs (Optional):23779}    Assessment & Plan:   Problem List Items Addressed This Visit   None    Assessment and Plan             Follow up plan: No follow-ups on file.

## 2022-12-21 ENCOUNTER — Encounter: Payer: Self-pay | Admitting: Nurse Practitioner

## 2022-12-21 ENCOUNTER — Encounter: Payer: Self-pay | Admitting: *Deleted

## 2022-12-21 ENCOUNTER — Ambulatory Visit: Payer: 59 | Admitting: Nurse Practitioner

## 2022-12-21 VITALS — BP 114/78 | HR 68 | Temp 97.6°F | Resp 14 | Ht 68.0 in | Wt 189.6 lb

## 2022-12-21 DIAGNOSIS — I252 Old myocardial infarction: Secondary | ICD-10-CM

## 2022-12-21 DIAGNOSIS — E782 Mixed hyperlipidemia: Secondary | ICD-10-CM

## 2022-12-21 DIAGNOSIS — E1169 Type 2 diabetes mellitus with other specified complication: Secondary | ICD-10-CM

## 2022-12-21 DIAGNOSIS — I25110 Atherosclerotic heart disease of native coronary artery with unstable angina pectoris: Secondary | ICD-10-CM

## 2022-12-21 DIAGNOSIS — F319 Bipolar disorder, unspecified: Secondary | ICD-10-CM

## 2022-12-21 DIAGNOSIS — I255 Ischemic cardiomyopathy: Secondary | ICD-10-CM

## 2022-12-21 DIAGNOSIS — Z23 Encounter for immunization: Secondary | ICD-10-CM | POA: Diagnosis not present

## 2022-12-21 DIAGNOSIS — Z955 Presence of coronary angioplasty implant and graft: Secondary | ICD-10-CM

## 2022-12-21 DIAGNOSIS — I5020 Unspecified systolic (congestive) heart failure: Secondary | ICD-10-CM

## 2022-12-21 DIAGNOSIS — E119 Type 2 diabetes mellitus without complications: Secondary | ICD-10-CM

## 2022-12-21 DIAGNOSIS — Z7984 Long term (current) use of oral hypoglycemic drugs: Secondary | ICD-10-CM

## 2023-01-24 ENCOUNTER — Encounter: Payer: Self-pay | Admitting: Dietician

## 2023-01-24 ENCOUNTER — Encounter: Payer: 59 | Attending: Internal Medicine | Admitting: Dietician

## 2023-01-24 DIAGNOSIS — Z713 Dietary counseling and surveillance: Secondary | ICD-10-CM | POA: Insufficient documentation

## 2023-01-24 DIAGNOSIS — E119 Type 2 diabetes mellitus without complications: Secondary | ICD-10-CM | POA: Diagnosis present

## 2023-01-24 NOTE — Progress Notes (Signed)
Diabetes Self-Management Education  Visit Type: Follow-up  Appt. Start Time: 0835 Appt. End Time: 0910  01/24/2023  Aaron Bowers, identified by name and date of birth, is a 53 y.o. male with a diagnosis of Diabetes:  .   ASSESSMENT  There were no vitals taken for this visit. There is no height or weight on file to calculate BMI.  Visit was conducted on [employer] intern, Limmie Patricia, present for visit  Pt reports starting Jardiance once daily in December, no side effects. Pt reports continuing to check glucose regularly, FBG - 130-140 , PPBG - <160.4 Pt reports finishing cardiac rehab mid-December, pt completed rehab with no complications and has returned to work full-time. Pt reports increased activity level now with being back at work, current schedule is 8-4 M-F, and 6-11 pm 3-4 days a week at [employer], also stays active on the weekends doing chores/housework. Pt reports normal stress level since returning to work, working on better work-life balance and stress reduction. Pt reports reading nutrition labels, trying to keep packaged foods <1 g of saturated fats, eating at [employer] at times while working but is more conscious of their food choices now. Pt reports continuing to drink sugar-free beverages the majority of the time. Pt reports finding more "Healthy Choice" meals that they enjoy, still does not cook for themselves due to not having enough free time because of work.    Diabetes Self-Management Education - 01/24/23 0914       Visit Information   Visit Type Follow-up      Pre-Education Assessment   Patient understands the diabetes disease and treatment process. Comprehends key points    Patient understands incorporating nutritional management into lifestyle. Comprehends key points    Patient undertands incorporating physical activity into lifestyle. Comprehends key points    Patient understands using medications safely. Demonstrates  understanding / competency    Patient understands monitoring blood glucose, interpreting and using results Demonstrates understanding / competency    Patient understands prevention, detection, and treatment of acute complications. Comprehends key points    Patient understands prevention, detection, and treatment of chronic complications. Compreheands key points    Patient understands how to develop strategies to address psychosocial issues. Needs Review    Patient understands how to develop strategies to promote health/change behavior. Comprehends key points      Complications   Last HgB A1C per patient/outside source 6.2 %   12/05/2022   How often do you check your blood sugar? 3-4 times/day    Fasting Blood glucose range (mg/dL) 295-284;13-244    Postprandial Blood glucose range (mg/dL) 010-272      Dietary Intake   Breakfast 1 small sausage biscuits, water    Lunch Frozen dinner, carrots, potatoes, meat, flavored water    Snack (afternoon) couple Thin mint cookies    Dinner Crispy chicken sandwich, small oreo McFlurry, Hi-C    Beverage(s) Water, flavored water      Activity / Exercise   Activity / Exercise Type ADL's;Light (walking / raking leaves)   Active at work   How many days per week do you exercise? 0    How many minutes per day do you exercise? 0    Total minutes per week of exercise 0      Patient Education   Disease Pathophysiology Explored patient's options for treatment of their diabetes    Healthy Eating Role of diet in the treatment of diabetes and the relationship between the three main macronutrients and  blood glucose level;Information on hints to eating out and maintain blood glucose control.;Meal options for control of blood glucose level and chronic complications.    Medications Reviewed patients medication for diabetes, action, purpose, timing of dose and side effects.    Monitoring Taught/evaluated SMBG meter.;Identified appropriate SMBG and/or A1C goals.     Chronic complications Reviewed with patient heart disease, higher risk of, and prevention;Lipid levels, blood glucose control and heart disease;Identified and discussed with patient  current chronic complications    Diabetes Stress and Support Role of stress on diabetes;Helped patient identify a support system for diabetes management    Lifestyle and Health Coping Lifestyle issues that need to be addressed for better diabetes care   Work/Life Balance     Individualized Goals (developed by patient)   Nutrition Follow meal plan discussed;General guidelines for healthy choices and portions discussed   Low saturated fat, low sodium   Medications take my medication as prescribed    Monitoring  Test my blood glucose as discussed    Problem Solving Eating Pattern;Sleep Pattern;Addressing barriers to behavior change    Reducing Risk examine blood glucose patterns      Patient Self-Evaluation of Goals - Patient rates self as meeting previously set goals (% of time)   Nutrition 25 - 50% (sometimes)    Physical Activity 50 - 75 % (half of the time)    Medications >75% (most of the time)    Monitoring >75% (most of the time)    Problem Solving and behavior change strategies  25 - 50% (sometimes)    Reducing Risk (treating acute and chronic complications) 50 - 75 % (half of the time)    Health Coping 50 - 75 % (half of the time)      Post-Education Assessment   Patient understands the diabetes disease and treatment process. Comprehends key points    Patient understands incorporating nutritional management into lifestyle. Comprehends key points    Patient undertands incorporating physical activity into lifestyle. Comprehends key points    Patient understands using medications safely. Demonstrates understanding / competency    Patient understands monitoring blood glucose, interpreting and using results Demonstrates understanding / competency    Patient understands prevention, detection, and treatment of  acute complications. Demonstrates understanding / competency    Patient understands prevention, detection, and treatment of chronic complications. Demonstrates understanding / competency    Patient understands how to develop strategies to address psychosocial issues. Comprehends key points    Patient understands how to develop strategies to promote health/change behavior. Comprehends key points      Outcomes   Expected Outcomes Demonstrated interest in learning. Expect positive outcomes    Future DMSE 3-4 months    Program Status Not Completed      Subsequent Visit   Since your last visit have you continued or begun to take your medications as prescribed? Yes    Since your last visit have you had your blood pressure checked? Yes    Is your most recent blood pressure lower, unchanged, or higher since your last visit? Lower    Since your last visit have you experienced any weight changes? Loss    Weight Loss (lbs) 2    Since your last visit, are you checking your blood glucose at least once a day? Yes             Individualized Plan for Diabetes Self-Management Training:   Learning Objective:  Patient will have a greater understanding of diabetes self-management. Patient  education plan is to attend individual and/or group sessions per assessed needs and concerns.   Plan:   Patient Instructions  Work on trying to drink between 3-4 bottles of water each day!  When choosing packaged foods, look for choices with 140 mg or less of sodium!  Keep up the great work! You have improved all of your lab values again!!  Expected Outcomes:  Demonstrated interest in learning. Expect positive outcomes  If problems or questions, patient to contact team via:  Phone and Email  Future DSME appointment: 3-4 months

## 2023-01-24 NOTE — Patient Instructions (Addendum)
Work on trying to drink between 3-4 bottles of water each day!  When choosing packaged foods, look for choices with 140 mg or less of sodium!  Keep up the great work! You have improved all of your lab values again!!

## 2023-01-26 ENCOUNTER — Ambulatory Visit: Payer: 59 | Admitting: Dietician

## 2023-03-11 ENCOUNTER — Other Ambulatory Visit: Payer: Self-pay | Admitting: Physician Assistant

## 2023-03-22 NOTE — Progress Notes (Unsigned)
 Cardiology Office Note    Date:  03/23/2023   ID:  Aaron Bowers, DOB October 06, 1970, MRN [id number]  PCP:  [name], FNP  Cardiologist:  [name], MD  Electrophysiologist:  None   Chief Complaint: Follow up  History of Present Illness:   Aaron Bowers is a 53 y.o. 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   He was admitted to the hospital from 9/10 through 09/15/2022 after having progressive intermittent exertional chest discomfort with strenuous activities over the preceding several months and was found to have an NSTEMI. High-sensitivity troponin [place] at [address].  LHC showed severe single-vessel CAD with 99% stenosis of the mid LAD involving the ostium of a moderate caliber D2 branch.  There was no significant disease noted in the dominant LCx or small RCA.  Moderately to severely reduced LV systolic function with an EF of 30 to 35% with normal filling pressure with an LVEDP of 10 mmHg.  He underwent successful PCI/DES to the mid LAD and angioplasty of jailed D2.  Final angiogram showed 0% residual stenosis in the LAD with 30% residual stenosis at the ostium of D2 with TIMI-3 flow throughout the left coronary artery.  Post-intervention echo showed an EF of 35 to 40% with wall motion abnormality involving the mid to distal anteroseptal, anterior, periapical, and mid to distal lateral walls with the basal regions best preserved, normal RV systolic function, ventricular cavity size, and RVSP, moderate mitral regurgitation, and an estimated right atrial pressure of 3 mmHg.  He was  seen in hospital follow-up on 09/22/2022 and was without symptoms of angina or cardiac decompensation.  He did note a couple episodes of short-lived chest discomfort that would last for less than 1 minute and spontaneously resolved.  Symptoms did not feel similar to his prior angina.  He was working with a Data processing manager.  He was adherent and tolerating cardiac medications.  He remained out of work given strenuous job function.  He was started on losartan 12.5 mg daily with continuation of Toprol-XL 12.5 mg and furosemide 20 mg every other day with KCl 10 mEq every other day.  He was seen in the office on 10/21/2022 and remained without symptoms of angina or cardiac decompensation.  He reported a couple episodes of of chest discomfort that would last a few seconds that would spontaneously resolve and did not feel similar to prior angina.  He also noted some positional dizziness.  He was eager to get back to work.  He was participating with cardiac rehab.  Symptoms of positional dizziness and relative hypotension precluded escalation of GDMT.  He was seen in the office on 11/18/2022 and remained without symptoms of angina or cardiac decompensation.  He continued to note rare split-second occurring episodes of left-sided chest comfort that did not feel similar to prior angina.  To further optimize GDMT, he was started on Farxiga.  Echo on 12/16/2022 showed improvement in LV systolic function with an EF of 55 to 60%, no regional wall motion abnormalities, normal LV diastolic function parameters, normal RV systolic function, and no  significant valvular abnormalities.  He was last seen in the office on 12/19/2022 and \\was  doing well, remaining active at baseline.  Furosemide was transitioned to as needed dosing with continuation of losartan, Toprol-XL, and Jardiance.  He comes in doing well from a cardiac perspective and is without symptoms of angina or cardiac decompensation.  No dyspnea, palpitations, presyncope, or  syncope.  He does note some dizziness if he stands up after squatting for prolonged time frames.  He also notes a bruise that occurred on the inner side of his right forearm that is resolving.  No trauma to this area.  He is adherent and tolerating cardiac medications without off target effect.  Remains adherent to DAPT without missing any doses.  Weight stable.  No progressive orthopnea.  Overall, he feels like he is doing well from a cardiac perspective.   Labs independently reviewed: 12/2022 - BUN 8, serum creatinine 0.84, potassium 4.5, albumin 4.7, AST/ALT normal, A1c 6.2, TC 88, TG 83, HDL 35, LDL 36 09/2022 - LP(a) 96.2, Hgb 16.0, PLT 347  Past Medical History:  Diagnosis Date   Bipolar disorder (HCC)    Borderline hyperlipidemia    CAD (coronary artery disease)    Diabetes mellitus (HCC)    Hyperlipemia     Past Surgical History:  Procedure Laterality Date   COLONOSCOPY WITH PROPOFOL N/A 05/13/2020   Procedure: COLONOSCOPY WITH PROPOFOL;  Surgeon: Toney Reil, MD;  Location: [facility] ENDOSCOPY;  Service: Gastroenterology;  Laterality: N/A;   CORONARY STENT INTERVENTION N/A 09/13/2022   Procedure: CORONARY STENT INTERVENTION;  Surgeon: [name], MD;  Location: [facility] INVASIVE CV LAB;  Service: Cardiovascular;  Laterality: N/A;   ESOPHAGOGASTRODUODENOSCOPY (EGD) WITH PROPOFOL N/A 05/13/2020   Procedure: ESOPHAGOGASTRODUODENOSCOPY (EGD) WITH PROPOFOL;  Surgeon: Toney Reil, MD;  Location: [facility] ENDOSCOPY;  Service: Gastroenterology;  Laterality: N/A;   LEFT HEART CATH AND CORONARY ANGIOGRAPHY N/A 09/13/2022   Procedure: LEFT HEART CATH AND CORONARY ANGIOGRAPHY;  Surgeon: [name], MD;  Location: [facility] INVASIVE CV LAB;  Service: Cardiovascular;  Laterality: N/A;    Current Medications: Current Meds  Medication Sig   ASPIRIN LOW DOSE 81 MG chewable tablet CHEW 1 TABLET BY MOUTH DAILY.   divalproex (DEPAKOTE ER) 500 MG 24 hr tablet Take 1 tablet (500 mg total) by  mouth 3 (three) times daily.   lamoTRIgine (LAMICTAL) 150 MG tablet Take 150 mg by mouth 2 (two) times daily.   metFORMIN (GLUCOPHAGE-XR) 500 MG 24 hr tablet Take 1 tablet (500 mg total) by mouth 2 (two) times daily with a meal.   [DISCONTINUED] atorvastatin (LIPITOR) 80 MG tablet TAKE 1 TABLET BY MOUTH EVERY DAY   [DISCONTINUED] empagliflozin (JARDIANCE) 10 MG TABS tablet Take 1 tablet (10 mg total) by mouth daily before breakfast.   [DISCONTINUED] ezetimibe (ZETIA) 10 MG tablet Take 1 tablet (10 mg total) by mouth daily.   [DISCONTINUED] furosemide (LASIX) 20 MG tablet Take 1 tablet (20 mg total) by mouth as needed (for wieght gain greater than 3 pounds in 1 day).   [DISCONTINUED] losartan (COZAAR) 25 MG tablet Take 0.5 tablets (12.5 mg total) by mouth daily.   [DISCONTINUED] metoprolol succinate (TOPROL-XL) 25 MG 24 hr tablet TAKE 0.5 TABLETS BY MOUTH AT BEDTIME.   [DISCONTINUED] potassium chloride (KLOR-CON M) 10 MEQ tablet Take 1 tablet (10 mEq total) by mouth every other day. Take only when taking Lasix (furosemide).   [DISCONTINUED] prasugrel (EFFIENT) 10 MG TABS tablet Take 1 tablet (10 mg total) by mouth daily.  Allergies:   Patient has no known allergies.   Social History   Socioeconomic History   Marital status: Single    Spouse name: Not on file   Number of children: 3   Years of education: Not on file   Highest education level: 12th grade  Occupational History   Not on file  Tobacco Use   Smoking status: Never    Passive exposure: Past   Smokeless tobacco: Never  Vaping Use   Vaping status: Never Used  Substance and Sexual Activity   Alcohol use: Never   Drug use: Never   Sexual activity: Not Currently  Other Topics Concern   Not on file  Social History Narrative   Not on file   Social Drivers of Health   Financial Resource Strain: High Risk (12/17/2022)   Overall Financial Resource Strain (CARDIA)    Difficulty of Paying Living Expenses: Very hard  Food  Insecurity: Food Insecurity Present (12/17/2022)   Hunger Vital Sign    Worried About Running Out of Food in the Last Year: Often true    Ran Out of Food in the Last Year: Often true  Transportation Needs: No Transportation Needs (12/17/2022)   PRAPARE - Administrator, Civil Service (Medical): No    Lack of Transportation (Non-Medical): No  Physical Activity: Insufficiently Active (12/17/2022)   Exercise Vital Sign    Days of Exercise per Week: 4 days    Minutes of Exercise per Session: 20 min  Stress: Stress Concern Present (12/17/2022)   [employer] of Occupational Health - Occupational Stress Questionnaire    Feeling of Stress : To some extent  Social Connections: Moderately Integrated (12/17/2022)   Social Connection and Isolation Panel [NHANES]    Frequency of Communication with Friends and Family: Three times a week    Frequency of Social Gatherings with Friends and Family: Twice a week    Attends Religious Services: 1 to 4 times per year    Active Member of [employer] or Organizations: Yes    Attends Engineer, structural: More than 4 times per year    Marital Status: Separated     Family History:  The patient's family history includes Asthma in his father; Deep vein thrombosis in his mother; Heart disease in his mother; Parkinson's disease in his father.  ROS:   12-point review of systems is negative unless otherwise noted in the HPI.   EKGs/Labs/Other Studies Reviewed:    Studies reviewed were summarized above. The additional studies were reviewed today:  Limited echo 12/16/2022: 1. Left ventricular ejection fraction, by estimation, is 55 to 60%. Left  ventricular ejection fraction by 3D volume is 55 %. Left ventricular  ejection fraction by 2D MOD biplane is 54.1 %. The left ventricle has  normal function. The left ventricle has  no regional wall motion abnormalities. Left ventricular diastolic  parameters were normal.   2. Right ventricular  systolic function is normal.   3. The mitral valve is normal in structure. No evidence of mitral valve  regurgitation.   4. The aortic valve is tricuspid. Aortic valve regurgitation is not  visualized.  __________   2D echo 09/14/2022: 1. Left ventricular ejection fraction, by estimation, is 35 to 40%. Left  ventricular ejection fraction by 3D volume is 45 %. The left ventricle has  moderately decreased function. The left ventricle demonstrates regional  wall motion abnormalities (mid to   distal anteroroseptal, anterior, periapical, mid to distal lateral walls). Basal regions best  preserved. Left ventricular diastolic parameters are  indeterminate. The average left ventricular global longitudinal strain is  -6.7 %. The global longitudinal strain is abnormal.   2. Right ventricular systolic function is normal. The right ventricular  size is normal. There is normal pulmonary artery systolic pressure. The  estimated right ventricular systolic pressure is 32.8 mmHg.   3. The mitral valve is normal in structure. Moderate mitral valve  regurgitation. No evidence of mitral stenosis.   4. The aortic valve is tricuspid. Aortic valve regurgitation is not  visualized. No aortic stenosis is present.   5. The inferior vena cava is normal in size with greater than 50%  respiratory variability, suggesting right atrial pressure of 3 mmHg.  __________   LHC 09/13/2022: Conclusions: Severe single-vessel coronary artery disease with 99% stenosis of mid LAD involving ostium of moderate-caliber D2 branch.  No significant disease noted in dominant LCx or small RCA. Moderately to severely reduced left ventricular systolic function (LVEF 30-35%) with normal filling pressure (LVEDP 10 mmHg). Successful PCI to mid LAD using Onyx Frontier 2.75 x 38 mm drug-eluting stent (postdilated to 3.1 mm) and angioplasty of jailed D2 branch using 2.0 mm balloon.  Final angiogram shows 0% residual stenosis in the LAD and 30%  residual stenosis at the ostium of D2.  There is TIMI-3 flow throughout the left coronary artery.   Recommendations: Continue tirofiban infusion for 4 hours. Dual antiplatelet therapy with aspirin and prasugrel for at least 12 months. Aggressive secondary prevention of coronary artery disease. Obtain echocardiogram.  If LVEF confirmed to be less than 35%, consider LifeVest at discharge.   EKG:  EKG is not ordered today.    Recent Labs: 09/29/2022: Hemoglobin 16.0; Platelets 347 12/05/2022: ALT 33; BUN 8; Creatinine, Ser 0.84; Potassium 4.5; Sodium 142  Recent Lipid Panel    Component Value Date/Time   CHOL 88 (L) 12/05/2022 0854   TRIG 83 12/05/2022 0854   HDL 35 (L) 12/05/2022 0854   CHOLHDL 2.5 12/05/2022 0854   CHOLHDL 7.2 09/14/2022 0247   VLDL 53 (H) 09/14/2022 0247   LDLCALC 36 12/05/2022 0854    PHYSICAL EXAM:    VS:  BP 124/86   Pulse 92   Ht 5\' 8"  (1.727 m)   Wt 187 lb 12.8 oz (85.2 kg)   SpO2 96%   BMI 28.55 kg/m   BMI: Body mass index is 28.55 kg/m.  Physical Exam Vitals reviewed.  Constitutional:      Appearance: He is well-developed.  HENT:     Head: Normocephalic and atraumatic.  Eyes:     General:        Right eye: No discharge.        Left eye: No discharge.  Cardiovascular:     Rate and Rhythm: Normal rate and regular rhythm.     Heart sounds: Normal heart sounds, S1 normal and S2 normal. Heart sounds not distant. No midsystolic click and no opening snap. No murmur heard.    No friction rub.  Pulmonary:     Effort: Pulmonary effort is normal. No respiratory distress.     Breath sounds: Normal breath sounds. No decreased breath sounds, wheezing, rhonchi or rales.  Chest:     Chest wall: No tenderness.  Musculoskeletal:     Cervical back: Normal range of motion.     Comments: Resolving soft bruise along the medial aspect of the right upper extremity.  Skin:    General: Skin is warm and dry.  Nails: There is no clubbing.  Neurological:      Mental Status: He is alert and oriented to person, place, and time.  Psychiatric:        Speech: Speech normal.        Behavior: Behavior normal.        Thought Content: Thought content normal.        Judgment: Judgment normal.     Wt Readings from Last 3 Encounters:  03/23/23 187 lb 12.8 oz (85.2 kg)  12/21/22 189 lb 9.6 oz (86 kg)  12/19/22 198 lb 6.4 oz (90 kg)     ASSESSMENT & PLAN:   CAD involving the native coronary arteries with high risk NSTEMI without angina: He is doing well and without symptoms concerning for angina or cardiac decompensation.  Continue aggressive risk factor modification and second being aspirin 81 mg and Effient 10 mg without interruption for a minimum of 12 months dating back to date of PCI (09/13/2022).  He will otherwise continue atorvastatin 80 mg, ezetimibe 10 mg, and Toprol-XL 12.5 mg.  No indication for further ischemic testing at this time.  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  Most recent echo demonstrated normalization of LV systolic function.  He has not needed any as needed furosemide.  Remains on Toprol-XL 12.5 mg, losartan 12.5 mg, and Jardiance 10 mg.  Recent labs stable.  HLD: LDL improved from 140-36.  Remains on atorvastatin 80 mg and ezetimibe 10 mg.    Disposition: F/u with Dr. Okey Dupre or an APP in 6 months.   Medication Adjustments/Labs and Tests Ordered: Current medicines are reviewed at length with the patient today.  Concerns regarding medicines are outlined above. Medication changes, Labs and Tests ordered today are summarized above and listed in the Patient Instructions accessible in Encounters.   Signed, Eula Listen, PA-C 03/23/2023 4:29 PM     [facility] [place] [address] [place], [place] [address] [phone]

## 2023-03-23 ENCOUNTER — Encounter: Payer: Self-pay | Admitting: Physician Assistant

## 2023-03-23 ENCOUNTER — Ambulatory Visit: Payer: 59 | Attending: Physician Assistant | Admitting: Physician Assistant

## 2023-03-23 VITALS — BP 124/86 | HR 92 | Ht 68.0 in | Wt 187.8 lb

## 2023-03-23 DIAGNOSIS — I2510 Atherosclerotic heart disease of native coronary artery without angina pectoris: Secondary | ICD-10-CM | POA: Diagnosis not present

## 2023-03-23 DIAGNOSIS — I255 Ischemic cardiomyopathy: Secondary | ICD-10-CM

## 2023-03-23 DIAGNOSIS — I5032 Chronic diastolic (congestive) heart failure: Secondary | ICD-10-CM | POA: Diagnosis not present

## 2023-03-23 DIAGNOSIS — E785 Hyperlipidemia, unspecified: Secondary | ICD-10-CM

## 2023-03-23 MED ORDER — EZETIMIBE 10 MG PO TABS: 10.0000 mg | ORAL_TABLET | Freq: Every day | ORAL | 3 refills | Status: DC

## 2023-03-23 MED ORDER — EMPAGLIFLOZIN 10 MG PO TABS: 10.0000 mg | ORAL_TABLET | Freq: Every day | ORAL | 3 refills | Status: AC

## 2023-03-23 MED ORDER — FUROSEMIDE 20 MG PO TABS: 20.0000 mg | ORAL_TABLET | ORAL | 3 refills | Status: AC | PRN

## 2023-03-23 MED ORDER — PRASUGREL HCL 10 MG PO TABS: 10.0000 mg | ORAL_TABLET | Freq: Every day | ORAL | 3 refills | Status: DC

## 2023-03-23 MED ORDER — ATORVASTATIN CALCIUM 80 MG PO TABS: 80.0000 mg | ORAL_TABLET | Freq: Every day | ORAL | 3 refills | Status: AC

## 2023-03-23 MED ORDER — METOPROLOL SUCCINATE ER 25 MG PO TB24: 12.5000 mg | ORAL_TABLET | Freq: Every day | ORAL | 3 refills | Status: DC

## 2023-03-23 MED ORDER — EMPAGLIFLOZIN 10 MG PO TABS: 10.0000 mg | ORAL_TABLET | Freq: Every day | ORAL | 3 refills | Status: DC

## 2023-03-23 MED ORDER — EZETIMIBE 10 MG PO TABS: 10.0000 mg | ORAL_TABLET | Freq: Every day | ORAL | 3 refills | Status: AC

## 2023-03-23 MED ORDER — LOSARTAN POTASSIUM 25 MG PO TABS: 12.5000 mg | ORAL_TABLET | Freq: Every day | ORAL | 3 refills | Status: AC

## 2023-03-23 MED ORDER — POTASSIUM CHLORIDE CRYS ER 10 MEQ PO TBCR: 10.0000 meq | EXTENDED_RELEASE_TABLET | ORAL | 3 refills | Status: AC

## 2023-03-23 NOTE — Patient Instructions (Signed)
 Medication Instructions:  Your Physician recommend you continue on your current medication as directed.    *If you need a refill on your cardiac medications before your next appointment, please call your pharmacy*   Lab Work: None ordered at this time   Follow-Up: At [facility], you and your health needs are our priority.  As part of our continuing mission to provide you with exceptional heart care, we have created designated Provider Care Teams.  These Care Teams include your primary Cardiologist (physician) and Advanced Practice Providers (APPs -  Physician Assistants and Nurse Practitioners) who all work together to provide you with the care you need, when you need it.    Your next appointment:   6 month(s)  Provider:   You may see [name], MD or Eula Listen, PA-C

## 2023-05-02 ENCOUNTER — Other Ambulatory Visit: Payer: Self-pay | Admitting: Physician Assistant

## 2023-05-03 ENCOUNTER — Telehealth: Payer: 59 | Admitting: Dietician

## 2023-05-05 ENCOUNTER — Other Ambulatory Visit: Payer: Self-pay

## 2023-05-05 ENCOUNTER — Emergency Department

## 2023-05-05 DIAGNOSIS — I2510 Atherosclerotic heart disease of native coronary artery without angina pectoris: Secondary | ICD-10-CM | POA: Insufficient documentation

## 2023-05-05 DIAGNOSIS — R0789 Other chest pain: Secondary | ICD-10-CM | POA: Diagnosis present

## 2023-05-05 LAB — BASIC METABOLIC PANEL WITH GFR
Anion gap: 9 (ref 5–15)
BUN: 11 mg/dL (ref 6–20)
CO2: 25 mmol/L (ref 22–32)
Calcium: 8.6 mg/dL — ABNORMAL LOW (ref 8.9–10.3)
Chloride: 102 mmol/L (ref 98–111)
Creatinine, Ser: 0.8 mg/dL (ref 0.61–1.24)
GFR, Estimated: >60 mL/min (ref >60)
Glucose, Bld: 167 mg/dL — ABNORMAL HIGH (ref 70–99)
Potassium: 3.6 mmol/L (ref 3.5–5.1)
Sodium: 136 mmol/L (ref 135–145)

## 2023-05-05 LAB — TROPONIN I (HIGH SENSITIVITY): Troponin I (High Sensitivity): 4 ng/L (ref <18)

## 2023-05-05 LAB — CBC
HCT: 43.1 % (ref 39.0–52.0)
Hemoglobin: 14.9 g/dL (ref 13.0–17.0)
MCH: 30.7 pg (ref 26.0–34.0)
MCHC: 34.6 g/dL (ref 30.0–36.0)
MCV: 88.7 fL (ref 80.0–100.0)
Platelets: 244 10*3/uL (ref 150–400)
RBC: 4.86 MIL/uL (ref 4.22–5.81)
RDW: 12.7 % (ref 11.5–15.5)
WBC: 10 10*3/uL (ref 4.0–10.5)
nRBC: 0 % (ref 0.0–0.2)

## 2023-05-05 NOTE — ED Triage Notes (Signed)
 First RN Note: Pt to ed via [facility] from work with c/o sudden onset substernal chest pressure that started while he was at work. Per EMS pt is currently pain free. Per EMS pt had "widow maker" in sept with cath lab activation.   110-ST 150/90 99.5 Oral 18 18G L AC CBG 129

## 2023-05-06 ENCOUNTER — Emergency Department
Admission: EM | Admit: 2023-05-06 | Discharge: 2023-05-06 | Disposition: A | Discharge status: Home / Self Care | Attending: Emergency Medicine | Admitting: Emergency Medicine

## 2023-05-06 DIAGNOSIS — R079 Chest pain, unspecified: Secondary | ICD-10-CM

## 2023-05-06 LAB — HEPATIC FUNCTION PANEL
ALT: 18 U/L (ref 0–44)
AST: 18 U/L (ref 15–41)
Albumin: 4.2 g/dL (ref 3.5–5.0)
Alkaline Phosphatase: 77 U/L (ref 38–126)
Bilirubin, Direct: 0.2 mg/dL (ref 0.0–0.2)
Indirect Bilirubin: 1.1 mg/dL — ABNORMAL HIGH (ref 0.3–0.9)
Total Bilirubin: 1.3 mg/dL — ABNORMAL HIGH (ref 0.0–1.2)
Total Protein: 7.7 g/dL (ref 6.5–8.1)

## 2023-05-06 LAB — LIPASE, BLOOD: Lipase: 28 U/L (ref 11–51)

## 2023-05-06 LAB — TROPONIN I (HIGH SENSITIVITY): Troponin I (High Sensitivity): 4 ng/L (ref <18)

## 2023-05-06 NOTE — Discharge Instructions (Signed)
 Fortunately your evaluation in the Emergency Department did not show any emergency conditions that accounted for your chest pain today.  When you are exerting yourself in a hot environment make sure you stay well-hydrated by drinking plenty of fluids.  Pick up your medications as planned tomorrow and continue taking as prescribed.  Call your heart doctor for follow-up appointment.  Thank you for choosing us  for your health care today!  Please see your primary doctor this week for a follow up appointment.   If you have any new, worsening, or unexpected symptoms call your doctor right away or come back to the emergency department for reevaluation.  It was my pleasure to care for you today.   Arron Large Margery Sheets, MD

## 2023-05-06 NOTE — ED Notes (Signed)
Lab called to add hepatic function and lipase.

## 2023-05-06 NOTE — ED Provider Notes (Signed)
 [facility] Provider Note    Event Date/Time   First MD Initiated Contact with Patient 05/06/23 0037     (approximate)   History   Chest Pain   HPI  Aaron Bowers is a 53 y.o. male   Past medical history of CAD and previous MI who presents to Emergency Department with chest tightness and nausea after exerting himself physically at work today.  He had some chest tightness he describes as muscle soreness in his chest after exerting himself at work.  He reports no shortness of breath, fever, cough.  He feels better now at rest.  He had not been taking his home medications due to inability to pay but he has a prescription waiting for him to be picked up on Saturday, tomorrow.  Independent Historian contributed to assessment above: His wife is at bedside to corroborate information past medical history as above  External Medical Documents Reviewed: Cardiology note from March 2025 history of NSTEMI with DES placement      Physical Exam   Triage Vital Signs: ED Triage Vitals  Encounter Vitals Group     BP 05/05/23 2055 131/80     Systolic BP Percentile --      Diastolic BP Percentile --      Pulse Rate 05/05/23 2055 (!) 106     Resp 05/05/23 2055 17     Temp 05/05/23 2055 99.1 F (37.3 C)     Temp Source 05/05/23 2055 Oral     SpO2 05/05/23 2055 99 %     Weight --      Height --      Head Circumference --      Peak Flow --      Pain Score 05/05/23 2058 0     Pain Loc --      Pain Education --      Exclude from Growth Chart --     Most recent vital signs: Vitals:   05/06/23 0025 05/06/23 0154  BP: 100/71 112/72  Pulse: 82 84  Resp: 18 17  Temp:  99 F (37.2 C)  SpO2: 100% 100%    General: Awake, no distress.  CV:  Good peripheral perfusion.  Resp:  Normal effort.  Abd:  No distention.  Other:  Awake alert comfortable appearing with normal vital signs.  Heart sounds normal, chest auscultation of the lung fields is clear.  Is  a soft benign abdominal exam deep palpation all quadrants.   ED Results / Procedures / Treatments   Labs (all labs ordered are listed, but only abnormal results are displayed) Labs Reviewed  BASIC METABOLIC PANEL WITH GFR - Abnormal; Notable for the following components:      Result Value   Glucose, Bld 167 (*)    Calcium  8.6 (*)    All other components within normal limits  HEPATIC FUNCTION PANEL - Abnormal; Notable for the following components:   Total Bilirubin 1.3 (*)    Indirect Bilirubin 1.1 (*)    All other components within normal limits  CBC  LIPASE, BLOOD  TROPONIN I (HIGH SENSITIVITY)  TROPONIN I (HIGH SENSITIVITY)     I ordered and reviewed the above labs they are notable for serial troponins a bit flat.  EKG  ED ECG REPORT I, Buell Carmin, the attending physician, personally viewed and interpreted this ECG.   Date: 05/06/2023  EKG Time: 2051  Rate: 102  Rhythm: sinus tachycardia  Axis: nl  Intervals:nl  ST&T Change: no  stemi    RADIOLOGY I independently reviewed and interpreted chest x-ray and I see no focality or pneumothorax I also reviewed radiologist's formal read.   PROCEDURES:  Critical Care performed: No  Procedures   MEDICATIONS ORDERED IN ED: Medications - No data to display  IMPRESSION / MDM / ASSESSMENT AND PLAN / ED COURSE  I reviewed the triage vital signs and the nursing notes.                                Patient's presentation is most consistent with acute presentation with potential threat to life or bodily function.  Differential diagnosis includes, but is not limited to, ACS, PE, dissection, musculoskeletal pain, muscle strain, pancreatitis or other intra-abdominal inflammation/infectious processes   The patient is on the cardiac monitor to evaluate for evidence of arrhythmia and/or significant heart rate changes.  MDM:    High risk chest pain in this person who has had a heart attack in the past.  Today while  exerting still had chest pain.  He thinks it might be a muscle strain as there is soreness to the anterior chest wall as well.  I think is important to check for ACS given his past medical history.  Fortunately his EKG is nonischemic and serial troponins were flat.  His symptomatology is not consistent with PE or dissection.  I do not think he has a respiratory infection.  Given his unremarkable workup and asymptomatic state at this time I think he can be discharged home and follow-up with his cardiologist and PMD.  He understands to return with any new or worsening symptoms.  I considered hospitalization for admission or observation for high risk chest pain but given that his symptoms have resolved now I think there is a musculoskeletal etiology, I think he is okay for discharge at this time will follow-up closely with his primary doctor and cardiologist.        FINAL CLINICAL IMPRESSION(S) / ED DIAGNOSES   Final diagnoses:  Nonspecific chest pain     Rx / DC Orders   ED Discharge Orders     None        Note:  This document was prepared using Dragon voice recognition software and may include unintentional dictation errors.    Buell Carmin, MD 05/06/23 [phone]

## 2023-06-15 ENCOUNTER — Telehealth: Admitting: Dietician

## 2023-06-22 ENCOUNTER — Encounter: Payer: Self-pay | Admitting: Dietician

## 2023-06-22 ENCOUNTER — Ambulatory Visit: Admitting: Dietician

## 2023-06-22 DIAGNOSIS — E119 Type 2 diabetes mellitus without complications: Secondary | ICD-10-CM

## 2023-06-22 NOTE — Patient Instructions (Addendum)
 Check your blood sugar each morning before eating or drinking (fasting). Look for numbers close to 100 mg/dL Check your blood sugar 2 hours after you begin eating a meal. Look for numbers under 160 mg/dL at all times.  Your goal A1c is below 7.0%  Continue to read your nutrition labels! Look for low sodium (under 140 mg per serving), low saturated fats (1g or less), and between 30-45g of total carbohydrates for the entire meal.  Lower your consumption of Gatorade to decrease sodium intake, try to add in at least 1 more bottle of plain water a day.

## 2023-06-22 NOTE — Progress Notes (Signed)
 Diabetes Self-Management Education  Visit Type: Follow-up  Appt. Start Time: 0930 Appt. End Time: 1000  06/22/2023  [name], identified by name and date of birth, is a 53 y.o. male with a diagnosis of Diabetes:  .   ASSESSMENT Visit was conducted on MyChart virtual platform Patient location: Work Provider location: Office Pt reports continuing DM medications (Metformin , Jardiance ) as prescribed, no side effects. Pt reports running out of strips recently, hasn't been checking glucose for the last week, reports FBG ranges between 110-130 mg/dL, PPBG >161 mg/dL.  Pt reports recent bout of chest pain at work, visited ER, no indications of cardiac event/labs normal. Pt reports checking weight every other day, weight stable ~180 lbs., taking Lasix  PRN but hasn't needed it in months. Pt reports trying to stay hydrated, work is hot, drinking 1 16 or 20 bottle of water, 20 oz Gatorade (mostly Zero sugar).  Pt reports work is very physical, getting in a lot of activity, reports one instance of potential hypoglycemia, states their glucose was ~90 mg/dL at the time, drank a little juice and felt better. Pt reports reading labels on packaged foods more now. Pt reports trying to eat small meals throughout the day, occasionally missing lunch due to being busy at work. Pt reports snacking during their shift at [employer] in the evening, usually has a wheat bun, chicken nugget, chicken strip. Pt states they usually don't have a meal between leaviing day job and starting night shift.    Diabetes Self-Management Education - 06/22/23 1005       Visit Information   Visit Type Follow-up      Psychosocial Assessment   Patient Belief/Attitude about Diabetes Motivated to manage diabetes    What is the hardest part about your diabetes right now, causing you the most concern, or is the most worrisome to you about your diabetes?   Making healty food and beverage choices    Learning Readiness Change in  progress      Pre-Education Assessment   Patient understands the diabetes disease and treatment process. Comprehends key points    Patient understands incorporating nutritional management into lifestyle. Needs Review    Patient undertands incorporating physical activity into lifestyle. Comprehends key points    Patient understands using medications safely. Comprehends key points    Patient understands monitoring blood glucose, interpreting and using results Comprehends key points    Patient understands prevention, detection, and treatment of acute complications. Comprehends key points    Patient understands prevention, detection, and treatment of chronic complications. Compreheands key points    Patient understands how to develop strategies to address psychosocial issues. Comprehends key points    Patient understands how to develop strategies to promote health/change behavior. Needs Review      Complications   How often do you check your blood sugar? 3-4 times/day    Fasting Blood glucose range (mg/dL) 09-604;540-981    Postprandial Blood glucose range (mg/dL) >191;478-295;621-308      Dietary Intake   Breakfast [place] 3 pancakes, sugar free syrup, 1 sausage patty, scrambled eggs, Cheerwine    Lunch Hamburger helper Cheeseburger macaroni    Dinner [place]set stew    Beverage(s) Cheerwine, Water, Gatorade, ZERO sugar Dr. Kathlene Paradise      Activity / Exercise   Activity / Exercise Type ADL's   Work is very physical   How many days per week do you exercise? 0    How many minutes per day do you exercise? 0    Total  minutes per week of exercise 0      Patient Education   Healthy Eating Role of diet in the treatment of diabetes and the relationship between the three main macronutrients and blood glucose level;Meal options for control of blood glucose level and chronic complications.    Monitoring Identified appropriate SMBG and/or A1C goals.    Chronic complications Lipid levels, blood  glucose control and heart disease      Individualized Goals (developed by patient)   Nutrition Follow meal plan discussed    Medications take my medication as prescribed    Monitoring  Test my blood glucose as discussed    Problem Solving Eating Pattern   Packaged/processed foods     Patient Self-Evaluation of Goals - Patient rates self as meeting previously set goals (% of time)   Nutrition 25 - 50% (sometimes)    Physical Activity 50 - 75 % (half of the time)    Medications >75% (most of the time)    Monitoring >75% (most of the time)    Problem Solving and behavior change strategies  25 - 50% (sometimes)    Reducing Risk (treating acute and chronic complications) 25 - 50% (sometimes)    Health Coping 25 - 50% (sometimes)      Post-Education Assessment   Patient understands the diabetes disease and treatment process. Comprehends key points    Patient understands incorporating nutritional management into lifestyle. Needs Review    Patient undertands incorporating physical activity into lifestyle. Comprehends key points    Patient understands using medications safely. Comphrehends key points    Patient understands monitoring blood glucose, interpreting and using results Comprehends key points    Patient understands prevention, detection, and treatment of acute complications. Comprehends key points    Patient understands prevention, detection, and treatment of chronic complications. Comprehends key points    Patient understands how to develop strategies to address psychosocial issues. Needs Review    Patient understands how to develop strategies to promote health/change behavior. Needs Review      Outcomes   Expected Outcomes Demonstrated interest in learning. Expect positive outcomes    Future DMSE Other (comment)   After PCP visit   Program Status Not Completed      Subsequent Visit   Since your last visit have you continued or begun to take your medications as prescribed? Yes     Since your last visit have you had your blood pressure checked? Yes    Is your most recent blood pressure lower, unchanged, or higher since your last visit? Lower    Since your last visit have you experienced any weight changes? No change    Since your last visit, are you checking your blood glucose at least once a day? Yes   out of strips this week, routinely checking otherwise         Individualized Plan for Diabetes Self-Management Training:   Learning Objective:  Patient will have a greater understanding of diabetes self-management. Patient education plan is to attend individual and/or group sessions per assessed needs and concerns.   Plan:   Patient Instructions  Check your blood sugar each morning before eating or drinking (fasting). Look for numbers close to 100 mg/dL Check your blood sugar 2 hours after you begin eating a meal. Look for numbers under 160 mg/dL at all times.  Your goal A1c is below 7.0%  Continue to read your nutrition labels! Look for low sodium (under 140 mg per serving), low saturated fats (1g  or less), and between 30-45g of total carbohydrates for the entire meal.  Lower your consumption of Gatorade to decrease sodium intake, try to add in at least 1 more bottle of plain water a day.    Expected Outcomes:  Demonstrated interest in learning. Expect positive outcomes  If problems or questions, patient to contact team via:  Phone and Email  Future DSME appointment: Other (comment) (After PCP visit)

## 2023-06-27 NOTE — Progress Notes (Unsigned)
 There were no vitals taken for this visit.   Subjective:    Patient ID: Aaron Bowers, male    DOB: Aug 27, 1970, 53 y.o.   MRN: [id number]  HPI: Aaron Bowers is a 53 y.o. male  No chief complaint on file.   Discussed the use of AI scribe software for clinical note transcription with the patient, who gave verbal consent to proceed.  History of Present Illness          12/21/2022    8:51 AM 12/16/2022    8:46 AM 10/20/2022   10:13 AM  Depression screen PHQ 2/9  Decreased Interest 0 0 0  Down, Depressed, Hopeless 0 1 0  PHQ - 2 Score 0 1 0  Altered sleeping 1 1 0  Tired, decreased energy 1 2 0  Change in appetite 0 0 0  Feeling bad or failure about yourself  0 0 0  Trouble concentrating 0 0 0  Moving slowly or fidgety/restless 0 0 0  Suicidal thoughts 0 0 0  PHQ-9 Score 2 4 0  Difficult doing work/chores Not difficult at all Somewhat difficult Not difficult at all    Relevant past medical, surgical, family and social history reviewed and updated as indicated. Interim medical history since our last visit reviewed. Allergies and medications reviewed and updated.  Review of Systems  Per HPI unless specifically indicated above     Objective:     There were no vitals taken for this visit.  {Vitals History (Optional):23777} Wt Readings from Last 3 Encounters:  03/23/23 187 lb 12.8 oz (85.2 kg)  12/21/22 189 lb 9.6 oz (86 kg)  12/19/22 198 lb 6.4 oz (90 kg)    Physical Exam Physical Exam    Results for orders placed or performed during the hospital encounter of 05/06/23  Basic metabolic panel   Collection Time: 05/05/23  8:55 PM  Result Value Ref Range   Sodium 136 135 - 145 mmol/L   Potassium 3.6 3.5 - 5.1 mmol/L   Chloride 102 98 - 111 mmol/L   CO2 25 22 - 32 mmol/L   Glucose, Bld 167 (H) 70 - 99 mg/dL   BUN 11 6 - 20 mg/dL   Creatinine, Ser 9.19 0.61 - 1.24 mg/dL   Calcium  8.6 (L) 8.9 - 10.3 mg/dL   GFR, Estimated >39 >39 mL/min   Anion gap  9 5 - 15  CBC   Collection Time: 05/05/23  8:55 PM  Result Value Ref Range   WBC 10.0 4.0 - 10.5 K/uL   RBC 4.86 4.22 - 5.81 MIL/uL   Hemoglobin 14.9 13.0 - 17.0 g/dL   HCT 56.8 60.9 - 47.9 %   MCV 88.7 80.0 - 100.0 fL   MCH 30.7 26.0 - 34.0 pg   MCHC 34.6 30.0 - 36.0 g/dL   RDW 87.2 88.4 - 84.4 %   Platelets 244 150 - 400 K/uL   nRBC 0.0 0.0 - 0.2 %  Troponin I (High Sensitivity)   Collection Time: 05/05/23  8:55 PM  Result Value Ref Range   Troponin I (High Sensitivity) 4 <18 ng/L  Hepatic function panel   Collection Time: 05/06/23 12:27 AM  Result Value Ref Range   Total Protein 7.7 6.5 - 8.1 g/dL   Albumin 4.2 3.5 - 5.0 g/dL   AST 18 15 - 41 U/L   ALT 18 0 - 44 U/L   Alkaline Phosphatase 77 38 - 126 U/L   Total Bilirubin 1.3 (H) 0.0 -  1.2 mg/dL   Bilirubin, Direct 0.2 0.0 - 0.2 mg/dL   Indirect Bilirubin 1.1 (H) 0.3 - 0.9 mg/dL  Lipase, blood   Collection Time: 05/06/23 12:27 AM  Result Value Ref Range   Lipase 28 11 - 51 U/L  Troponin I (High Sensitivity)   Collection Time: 05/06/23 12:27 AM  Result Value Ref Range   Troponin I (High Sensitivity) 4 <18 ng/L   {Labs (Optional):23779}       Assessment & Plan:   Problem List Items Addressed This Visit   None    Assessment and Plan Assessment & Plan         Follow up plan: No follow-ups on file.

## 2023-06-28 ENCOUNTER — Encounter: Payer: Self-pay | Admitting: Nurse Practitioner

## 2023-06-28 ENCOUNTER — Ambulatory Visit: Payer: 59 | Admitting: Nurse Practitioner

## 2023-06-28 VITALS — BP 116/62 | HR 80 | Temp 97.9°F | Resp 18 | Ht 68.0 in | Wt 193.0 lb

## 2023-06-28 DIAGNOSIS — I255 Ischemic cardiomyopathy: Secondary | ICD-10-CM

## 2023-06-28 DIAGNOSIS — K219 Gastro-esophageal reflux disease without esophagitis: Secondary | ICD-10-CM

## 2023-06-28 DIAGNOSIS — Z1159 Encounter for screening for other viral diseases: Secondary | ICD-10-CM

## 2023-06-28 DIAGNOSIS — E1165 Type 2 diabetes mellitus with hyperglycemia: Secondary | ICD-10-CM

## 2023-06-28 DIAGNOSIS — Z955 Presence of coronary angioplasty implant and graft: Secondary | ICD-10-CM

## 2023-06-28 DIAGNOSIS — F319 Bipolar disorder, unspecified: Secondary | ICD-10-CM

## 2023-06-28 DIAGNOSIS — I5020 Unspecified systolic (congestive) heart failure: Secondary | ICD-10-CM | POA: Diagnosis not present

## 2023-06-28 DIAGNOSIS — I252 Old myocardial infarction: Secondary | ICD-10-CM

## 2023-06-28 DIAGNOSIS — Z7984 Long term (current) use of oral hypoglycemic drugs: Secondary | ICD-10-CM

## 2023-06-28 DIAGNOSIS — I25110 Atherosclerotic heart disease of native coronary artery with unstable angina pectoris: Secondary | ICD-10-CM | POA: Diagnosis not present

## 2023-06-28 DIAGNOSIS — E782 Mixed hyperlipidemia: Secondary | ICD-10-CM

## 2023-06-28 DIAGNOSIS — Z114 Encounter for screening for human immunodeficiency virus [HIV]: Secondary | ICD-10-CM

## 2023-06-29 ENCOUNTER — Ambulatory Visit: Payer: Self-pay | Admitting: Nurse Practitioner

## 2023-06-29 LAB — CBC WITH DIFFERENTIAL/PLATELET
Absolute Lymphocytes: 2577 {cells}/uL (ref 850–3900)
Absolute Monocytes: 510 {cells}/uL (ref 200–950)
Basophils Absolute: 38 {cells}/uL (ref 0–200)
Basophils Relative: 0.6 %
Eosinophils Absolute: 208 {cells}/uL (ref 15–500)
Eosinophils Relative: 3.3 %
HCT: 43.1 % (ref 38.5–50.0)
Hemoglobin: 14.7 g/dL (ref 13.2–17.1)
MCH: 31.3 pg (ref 27.0–33.0)
MCHC: 34.1 g/dL (ref 32.0–36.0)
MCV: 91.7 fL (ref 80.0–100.0)
MPV: 9.8 fL (ref 7.5–12.5)
Monocytes Relative: 8.1 %
Neutro Abs: 2967 {cells}/uL (ref 1500–7800)
Neutrophils Relative %: 47.1 %
Platelets: 258 10*3/uL (ref 140–400)
RBC: 4.7 10*6/uL (ref 4.20–5.80)
RDW: 12.6 % (ref 11.0–15.0)
Total Lymphocyte: 40.9 %
WBC: 6.3 10*3/uL (ref 3.8–10.8)

## 2023-06-29 LAB — MICROALBUMIN / CREATININE URINE RATIO
Creatinine, Urine: 266 mg/dL (ref 20–320)
Microalb Creat Ratio: 1 mg/g{creat} (ref <30)
Microalb, Ur: 0.3 mg/dL

## 2023-06-29 LAB — LIPID PANEL
Cholesterol: 99 mg/dL (ref <200)
HDL: 36 mg/dL — ABNORMAL LOW (ref >=40)
LDL Cholesterol (Calc): 43 mg/dL
Non-HDL Cholesterol (Calc): 63 mg/dL (ref <130)
Total CHOL/HDL Ratio: 2.8 (calc) (ref <5.0)
Triglycerides: 121 mg/dL (ref <150)

## 2023-06-29 LAB — COMPREHENSIVE METABOLIC PANEL WITH GFR
AG Ratio: 1.5 (calc) (ref 1.0–2.5)
ALT: 13 U/L (ref 9–46)
AST: 14 U/L (ref 10–35)
Albumin: 4.2 g/dL (ref 3.6–5.1)
Alkaline phosphatase (APISO): 77 U/L (ref 35–144)
BUN: 10 mg/dL (ref 7–25)
CO2: 29 mmol/L (ref 20–32)
Calcium: 9.1 mg/dL (ref 8.6–10.3)
Chloride: 102 mmol/L (ref 98–110)
Creat: 0.85 mg/dL (ref 0.70–1.30)
Globulin: 2.8 g/dL (ref 1.9–3.7)
Glucose, Bld: 117 mg/dL — ABNORMAL HIGH (ref 65–99)
Potassium: 4.2 mmol/L (ref 3.5–5.3)
Sodium: 139 mmol/L (ref 135–146)
Total Bilirubin: 0.8 mg/dL (ref 0.2–1.2)
Total Protein: 7 g/dL (ref 6.1–8.1)
eGFR: 105 mL/min/{1.73_m2} (ref >=60)

## 2023-06-29 LAB — HEMOGLOBIN A1C
Hgb A1c MFr Bld: 6 % — ABNORMAL HIGH (ref <5.7)
Mean Plasma Glucose: 126 mg/dL
eAG (mmol/L): 7 mmol/L

## 2023-06-29 LAB — HIV ANTIBODY (ROUTINE TESTING W REFLEX): HIV 1&2 Ab, 4th Generation: NONREACTIVE

## 2023-06-29 LAB — HEPATITIS C ANTIBODY: Hepatitis C Ab: NONREACTIVE

## 2023-09-13 ENCOUNTER — Encounter: Payer: Self-pay | Admitting: Internal Medicine

## 2023-09-13 ENCOUNTER — Ambulatory Visit: Attending: Internal Medicine | Admitting: Internal Medicine

## 2023-09-13 VITALS — BP 108/80 | HR 79 | Ht 68.0 in | Wt 192.0 lb

## 2023-09-13 DIAGNOSIS — I2510 Atherosclerotic heart disease of native coronary artery without angina pectoris: Secondary | ICD-10-CM | POA: Diagnosis not present

## 2023-09-13 DIAGNOSIS — I5032 Chronic diastolic (congestive) heart failure: Secondary | ICD-10-CM | POA: Diagnosis not present

## 2023-09-13 DIAGNOSIS — I255 Ischemic cardiomyopathy: Secondary | ICD-10-CM | POA: Diagnosis not present

## 2023-09-13 DIAGNOSIS — E1169 Type 2 diabetes mellitus with other specified complication: Secondary | ICD-10-CM

## 2023-09-13 DIAGNOSIS — E785 Hyperlipidemia, unspecified: Secondary | ICD-10-CM

## 2023-09-13 MED ORDER — CLOPIDOGREL BISULFATE 75 MG PO TABS: 75.0000 mg | ORAL_TABLET | Freq: Every day | ORAL | 3 refills | Status: AC

## 2023-09-13 MED ORDER — NITROGLYCERIN 0.4 MG SL SUBL: 0.4000 mg | SUBLINGUAL_TABLET | SUBLINGUAL | 3 refills | Status: AC | PRN

## 2023-09-13 NOTE — Patient Instructions (Addendum)
 Medication Instructions:  Your physician recommends the following medication changes.  STOP TAKING: Aspirin  81 mg  Effient  10 mg once you complete your current prescription   START TAKING: Plavix  75 mg by mouth daily (after you have completed Effient ) A prescription has been sent in for Nitroglycerin .  If you have chest pain that doesn't relieve quickly, place one tablet under your tongue and allow it to dissolve.  If no relief after 5 minutes, you may take another pill.  If no relief after 5 minutes, you may take a 3rd dose but you need to call 911 and report to ER immediately.   *If you need a refill on your cardiac medications before your next appointment, please call your pharmacy*  Lab Work: No labs ordered today    Testing/Procedures: No test ordered today   Follow-Up: At [facility], you and your health needs are our priority.  As part of our continuing mission to provide you with exceptional heart care, our providers are all part of one team.  This team includes your primary Cardiologist (physician) and Advanced Practice Providers or APPs (Physician Assistants and Nurse Practitioners) who all work together to provide you with the care you need, when you need it.  Your next appointment:   1 year(s)  Provider:   You may see Lonni Hanson, MD or one of the following Advanced Practice Providers on your designated Care Team:   Lonni Meager, NP Lesley Maffucci, PA-C Bernardino Bring, PA-C Cadence Shamrock, PA-C Tylene Lunch, NP Barnie Hila, NP

## 2023-09-13 NOTE — Progress Notes (Signed)
 Cardiology Office Note:  .   Date:  09/13/2023  ID:  Vinie JONELLE Generous, DOB Oct 23, 1970, MRN [id number] PCP: Gareth Mliss FALCON, FNP  [facility] Providers Cardiologist:  Lonni Hanson, MD     History of Present Illness: Aaron Bowers is a 52 y.o. 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  He was last seen in our office in 03/2023 by Bernardino Bring, PA, at which time he noted occasional orthostatic lightheadedness.  He was otherwise doing well.  No medication changes or additional testing were pursued.  Today, Aaron Bowers reports that he has continued to do well, denying chest pain, shortness of breath, palpitations, lightheadedness, edema.  His only concern today is that he noticed a bruise along the upper inner side of his left thigh about a month ago.  The bruising has resolved but he has recently still noted a small knot under the skin, about the size of a fingertip.  He is tolerating his medications well.  He has not needed any as needed furosemide /potassium since his last visit.  ROS: See HPI  Studies Reviewed: SABRA   EKG Interpretation Date/Time:  Wednesday September 13 2023 14:33:16 EDT Ventricular Rate:  79 PR Interval:  152 QRS Duration:  72 QT Interval:  376 QTC Calculation: 431 R Axis:   42  Text Interpretation: Normal sinus rhythm Normal ECG When compared with ECG of 05-May-2023 20:51, Criteria for Anterior infarct are no longer Present HEART RATE has decreased Confirmed by Sem Mccaughey, Lonni [phone]) on 09/13/2023 2:36:36 PM     Risk Assessment/Calculations:             Physical Exam:   VS:  BP 108/80 (BP Location: Left Arm, Patient Position: Sitting, Cuff Size: Normal)   Pulse 79   Ht 5' 8 (1.727 m)   Wt 192 lb (87.1 kg)   SpO2 98%    BMI 29.19 kg/m    Wt Readings from Last 3 Encounters:  09/13/23 192 lb (87.1 kg)  06/28/23 193 lb (87.5 kg)  03/23/23 187 lb 12.8 oz (85.2 kg)    General:  NAD. Neck: No JVD or HJR. Lungs: Clear to auscultation bilaterally without wheezes or crackles. Heart: Regular rate and rhythm without murmurs, rubs, or gallops. Abdomen: Soft, nontender, nondistended. Extremities: No lower extremity edema.  No evidence of bruising or hematoma on the left thigh.  ASSESSMENT AND PLAN: .    Coronary artery disease: Aaron Bowers has recovered well from his high risk NSTEMI and PCI to the LAD a year ago.  We have agreed to discontinue aspirin  and transition from prasugrel  to clopidogrel  75 mg daily for long-term single antiplatelet therapy when he has exhausted his current supply of prasugrel .  I have also provided Aaron Bowers with a prescription for sublingual nitroglycerin  to be used as needed for chest pain.  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  Follow-up echocardiogram in 12/2022 showed normalization of LVEF.  Continue current regimen of dapagliflozin , metoprolol  succinate, and losartan .  No need for standing diuretic.  Hyperlipidemia associated with type 2 diabetes mellitus: Lipids well-controlled on last check in 06/2023 with LDL of 43 and triglycerides of 121.  Continue current regimen  of atorvastatin  and ezetimibe  for target LDL less than 70, ideally below 55.    Dispo: Return to clinic in 1 year.  Signed, Lonni Hanson, MD

## 2023-11-07 ENCOUNTER — Other Ambulatory Visit: Payer: Self-pay | Admitting: Physician Assistant

## 2023-11-07 ENCOUNTER — Other Ambulatory Visit: Payer: Self-pay | Admitting: Nurse Practitioner

## 2023-11-07 DIAGNOSIS — E119 Type 2 diabetes mellitus without complications: Secondary | ICD-10-CM

## 2023-11-08 NOTE — Telephone Encounter (Signed)
 Requested by interface surescripts. Future visit 12/29/23  Requested Prescriptions  Pending Prescriptions Disp Refills   metFORMIN  (GLUCOPHAGE -XR) 500 MG 24 hr tablet [Pharmacy Med Name: METFORMIN  HCL ER 500 MG TABLET] 180 tablet 0    Sig: TAKE 1 TABLET BY MOUTH 2 TIMES DAILY WITH A MEAL.     Endocrinology:  Diabetes - Biguanides Failed - 11/08/2023 11:50 AM      Failed - B12 Level in normal range and within 720 days    No results found for: VITAMINB12       Passed - Cr in normal range and within 360 days    Creat  Date Value Ref Range Status  06/28/2023 0.85 0.70 - 1.30 mg/dL Final   Creatinine, Urine  Date Value Ref Range Status  06/28/2023 266 20 - 320 mg/dL Final         Passed - HBA1C is between 0 and 7.9 and within 180 days    Hgb A1c MFr Bld  Date Value Ref Range Status  06/28/2023 6.0 (H) <5.7 % Final    Comment:    For someone without known diabetes, a hemoglobin  A1c value between 5.7% and 6.4% is consistent with prediabetes and should be confirmed with a  follow-up test. . For someone with known diabetes, a value <7% indicates that their diabetes is well controlled. A1c targets should be individualized based on duration of diabetes, age, comorbid conditions, and other considerations. . This assay result is consistent with an increased risk of diabetes. . Currently, no consensus exists regarding use of hemoglobin A1c for diagnosis of diabetes for children. .          Passed - eGFR in normal range and within 360 days    GFR, Estimated  Date Value Ref Range Status  05/05/2023 >60 >60 mL/min Final    Comment:    (NOTE) Calculated using the CKD-EPI Creatinine Equation (2021)    eGFR  Date Value Ref Range Status  06/28/2023 105 > OR = 60 mL/min/1.24m2 Final  12/05/2022 105 >59 mL/min/1.73 Final         Passed - Valid encounter within last 6 months    Recent Outpatient Visits           4 months ago Ischemic cardiomyopathy   [facility] Gareth Mliss FALCON, FNP       Future Appointments             In 1 month Gareth, Mliss FALCON, FNP [facility] [place]            Passed - CBC within normal limits and completed in the last 12 months    WBC  Date Value Ref Range Status  06/28/2023 6.3 3.8 - 10.8 Thousand/uL Final   RBC  Date Value Ref Range Status  06/28/2023 4.70 4.20 - 5.80 Million/uL Final   Hemoglobin  Date Value Ref Range Status  06/28/2023 14.7 13.2 - 17.1 g/dL Final  90/73/7975 83.9 13.0 - 17.7 g/dL Final   HCT  Date Value Ref Range Status  06/28/2023 43.1 38.5 - 50.0 % Final   Hematocrit  Date Value Ref Range Status  09/29/2022 48.6 37.5 - 51.0 % Final   MCHC  Date Value Ref Range Status  06/28/2023 34.1 32.0 - 36.0 g/dL Final    Comment:    For adults, a slight decrease in the calculated MCHC value (in the range of 30 to 32 g/dL) is most likely not clinically significant;  however, it should be interpreted with caution in correlation with other red cell parameters and the patient's clinical condition.    [facility]  Date Value Ref Range Status  06/28/2023 31.3 27.0 - 33.0 pg Final   MCV  Date Value Ref Range Status  06/28/2023 91.7 80.0 - 100.0 fL Final  09/29/2022 93 79 - 97 fL Final   No results found for: PLTCOUNTKUC, LABPLAT, POCPLA RDW  Date Value Ref Range Status  06/28/2023 12.6 11.0 - 15.0 % Final  09/29/2022 12.5 11.6 - 15.4 % Final

## 2023-11-09 MED ORDER — METOPROLOL SUCCINATE ER 25 MG PO TB24: 12.5000 mg | ORAL_TABLET | Freq: Every day | ORAL | 3 refills | Status: AC

## 2023-12-26 LAB — OPHTHALMOLOGY REPORT-SCANNED

## 2023-12-29 ENCOUNTER — Encounter: Payer: Self-pay | Admitting: Nurse Practitioner

## 2023-12-29 ENCOUNTER — Ambulatory Visit: Admitting: Nurse Practitioner

## 2023-12-29 VITALS — BP 122/86 | HR 79 | Temp 98.0°F | Ht 68.0 in | Wt 188.0 lb

## 2023-12-29 DIAGNOSIS — K219 Gastro-esophageal reflux disease without esophagitis: Secondary | ICD-10-CM

## 2023-12-29 DIAGNOSIS — Z7984 Long term (current) use of oral hypoglycemic drugs: Secondary | ICD-10-CM | POA: Diagnosis not present

## 2023-12-29 DIAGNOSIS — E1165 Type 2 diabetes mellitus with hyperglycemia: Secondary | ICD-10-CM | POA: Diagnosis not present

## 2023-12-29 DIAGNOSIS — Z23 Encounter for immunization: Secondary | ICD-10-CM | POA: Diagnosis not present

## 2023-12-29 DIAGNOSIS — I252 Old myocardial infarction: Secondary | ICD-10-CM

## 2023-12-29 DIAGNOSIS — Z955 Presence of coronary angioplasty implant and graft: Secondary | ICD-10-CM | POA: Diagnosis not present

## 2023-12-29 DIAGNOSIS — I25110 Atherosclerotic heart disease of native coronary artery with unstable angina pectoris: Secondary | ICD-10-CM

## 2023-12-29 DIAGNOSIS — I5020 Unspecified systolic (congestive) heart failure: Secondary | ICD-10-CM

## 2023-12-29 DIAGNOSIS — E1169 Type 2 diabetes mellitus with other specified complication: Secondary | ICD-10-CM

## 2023-12-29 DIAGNOSIS — Z130 Encounter for screening for diseases of the blood and blood-forming organs and certain disorders involving the immune mechanism: Secondary | ICD-10-CM

## 2023-12-29 DIAGNOSIS — I255 Ischemic cardiomyopathy: Secondary | ICD-10-CM | POA: Diagnosis not present

## 2023-12-29 DIAGNOSIS — F319 Bipolar disorder, unspecified: Secondary | ICD-10-CM | POA: Diagnosis not present

## 2023-12-29 DIAGNOSIS — M545 Low back pain, unspecified: Secondary | ICD-10-CM

## 2023-12-29 NOTE — Progress Notes (Signed)
 "  BP 122/86   Pulse 79   Temp 98 F (36.7 C)   Ht 5' 8 (1.727 m)   Wt 188 lb (85.3 kg)   SpO2 98%   BMI 28.59 kg/m    Subjective:    Patient ID: Aaron Bowers, male    DOB: 1970-01-20, 53 y.o.   MRN: [id number]  HPI: Aaron Bowers is a 53 y.o. male  Chief Complaint  Patient presents with   Medical Management of Chronic Issues    Pt c/o chronic low back pain.    Discussed the use of AI scribe software for clinical note transcription with the patient, who gave verbal consent to proceed.  History of Present Illness Aaron Bowers is a 53 year old male with coronary artery disease, heart failure, and type two diabetes who presents for a six month follow-up.  Cardiovascular symptoms and management - Coronary artery disease and heart failure. - Currently taking atorvastatin  80 mg daily, Plavix  75 mg daily, losartan  20/12.5 mg daily, and metoprolol  12.5 mg daily. - Uses Lasix  as needed for swelling and takes potassium when using Lasix . - No significant swelling in feet. - Able to feel touch on feet.  Glycemic control - Type 2 diabetes is well-controlled. - Recent hemoglobin A1c is 6.0. - Takes Jardiance  10 mg daily and metformin  500 mg twice daily. - Monitors blood sugar, though less consistent recently, especially during the holidays. - Last recorded blood sugar was 117 mg/dL.  Mood stability - Bipolar disorder managed with Depakote  500 mg three times daily and Lamictal  150 mg twice daily. - Mood is generally stable. - Experiences occasional mood swings and crying spells that occur unexpectedly.  Musculoskeletal pain - Chronic low back pain due to bulging disc. - Pain flares with heavy lifting or certain activities. - Previously underwent physical therapy with some relief. - Plans to resume exercises and stretches to manage pain.         12/29/2023    8:13 AM 06/28/2023    3:18 PM 12/21/2022    8:51 AM  Depression screen PHQ 2/9  Decreased  Interest 0 0 0  Down, Depressed, Hopeless 0 0 0  PHQ - 2 Score 0 0 0  Altered sleeping 0 0 1  Tired, decreased energy 0 0 1  Change in appetite 0 0 0  Feeling bad or failure about yourself  0 0 0  Trouble concentrating 0 0 0  Moving slowly or fidgety/restless 0 0 0  Suicidal thoughts 0 0 0  PHQ-9 Score 0 0  2   Difficult doing work/chores Not difficult at all Not difficult at all Not difficult at all     Data saved with a previous flowsheet row definition       12/29/2023    8:13 AM 06/28/2023    3:18 PM 12/21/2022    8:52 AM  GAD 7 : Generalized Anxiety Score  Nervous, Anxious, on Edge 0 0 0  Control/stop worrying 0 0 0  Worry too much - different things 0 0 0  Trouble relaxing 0 0 0  Restless 0 0 0  Easily annoyed or irritable 0 0 0  Afraid - awful might happen 0 0 0  Total GAD 7 Score 0 0 0  Anxiety Difficulty Not difficult at all Not difficult at all      Relevant past medical, surgical, family and social history reviewed and updated as indicated. Interim medical history since our last visit reviewed. Allergies and  medications reviewed and updated.  Review of Systems  Ten systems reviewed and is negative except as mentioned in HPI      Objective:      BP 122/86   Pulse 79   Temp 98 F (36.7 C)   Ht 5' 8 (1.727 m)   Wt 188 lb (85.3 kg)   SpO2 98%   BMI 28.59 kg/m    Wt Readings from Last 3 Encounters:  12/29/23 188 lb (85.3 kg)  09/13/23 192 lb (87.1 kg)  06/28/23 193 lb (87.5 kg)    Physical Exam GENERAL: Alert, cooperative, well developed, no acute distress HEENT: Normocephalic, normal oropharynx, moist mucous membranes CHEST: Clear to auscultation bilaterally, no wheezes, rhonchi, or crackles CARDIOVASCULAR: Normal heart rate and rhythm, S1 and S2 normal without murmurs ABDOMEN: Soft, non-tender, non-distended, without organomegaly, normal bowel sounds EXTREMITIES: No cyanosis or edema NEUROLOGICAL: Cranial nerves grossly intact, moves all  extremities without gross motor or sensory deficit, sensation intact  Diabetic Foot Exam - Simple   Simple Foot Form Diabetic Foot exam was performed with the following findings: Yes 12/29/2023  8:07 AM  Visual Inspection No deformities, no ulcerations, no other skin breakdown bilaterally: Yes Sensation Testing Intact to touch and monofilament testing bilaterally: Yes Pulse Check Posterior Tibialis and Dorsalis pulse intact bilaterally: Yes Comments     Results for orders placed or performed in visit on 12/29/23  OPHTHALMOLOGY REPORT-SCANNED   Collection Time: 12/26/23  7:14 AM  Result Value Ref Range   HM Diabetic Eye Exam No Retinopathy No Retinopathy   A Comment            Assessment & Plan:   Problem List Items Addressed This Visit       Cardiovascular and Mediastinum   CAD (coronary artery disease) - Primary   Relevant Orders   Comprehensive metabolic panel with GFR   Lipid panel   Ischemic cardiomyopathy   Relevant Orders   Comprehensive metabolic panel with GFR   Lipid panel   Heart failure with recovered ejection fraction (HFrecEF) (HCC)   Relevant Orders   Comprehensive metabolic panel with GFR   Lipid panel     Digestive   GERD without esophagitis     Endocrine   New onset type 2 diabetes mellitus (HCC)   Hyperlipidemia associated with type 2 diabetes mellitus (HCC)   Relevant Orders   Comprehensive metabolic panel with GFR   Lipid panel     Other   Bipolar 1 disorder (HCC)   S/P coronary artery stent placement   Relevant Orders   Lipid panel   History of non-ST elevation myocardial infarction (NSTEMI)   Relevant Orders   Comprehensive metabolic panel with GFR   Lipid panel   Chronic midline low back pain without sciatica   Other Visit Diagnoses       Screening for deficiency anemia       Relevant Orders   CBC with Differential/Platelet     Immunization due       Relevant Orders   Flu vaccine trivalent PF, 6mos and  older(Flulaval,Afluria,Fluarix,Fluzone )        Assessment and Plan Assessment & Plan Type 2 diabetes mellitus with hyperglycemia and hyperlipidemia Type 2 diabetes is well-controlled with an A1c of 6.0. Recent glucose level was 117 mg/dL. Hyperlipidemia is well-managed with an LDL of 43 mg/dL. He has been less consistent with blood sugar monitoring due to the holidays but acknowledges the importance of maintaining control. - Ordered CBC, CMP, lipid panel,  and A1c - Performed diabetic foot exam  Coronary artery disease with heart failure and ischemic cardiomyopathy Coronary artery disease is well-managed with no recent cardiac symptoms. He is under cardiology care and takes Lasix  as needed for swelling, with potassium supplementation when Lasix  is taken.  Bipolar 1 disorder Well-managed with mood swings and occasional crying spells, which resolve spontaneously. He is on Depakote  and Lamictal .  Chronic low back pain Due to a bulging disc. Physical therapy has been beneficial, but symptoms flare with heavy lifting or normal activities. - Encouraged continuation of physical therapy exercises and stretches  General health maintenance Depression and anxiety screening is negative.        Follow up plan: Return in about 6 months (around 06/28/2024) for follow up. "

## 2023-12-30 LAB — HEMOGLOBIN A1C
Hgb A1c MFr Bld: 5.9 % — ABNORMAL HIGH
Mean Plasma Glucose: 123 mg/dL
eAG (mmol/L): 6.8 mmol/L

## 2023-12-30 LAB — COMPREHENSIVE METABOLIC PANEL WITH GFR
AG Ratio: 1.6 (calc) (ref 1.0–2.5)
ALT: 21 U/L (ref 9–46)
AST: 14 U/L (ref 10–35)
Albumin: 4.3 g/dL (ref 3.6–5.1)
Alkaline phosphatase (APISO): 86 U/L (ref 35–144)
BUN/Creatinine Ratio: 14 (calc) (ref 6–22)
BUN: 10 mg/dL (ref 7–25)
CO2: 30 mmol/L (ref 20–32)
Calcium: 9.2 mg/dL (ref 8.6–10.3)
Chloride: 103 mmol/L (ref 98–110)
Creat: 0.69 mg/dL — ABNORMAL LOW (ref 0.70–1.30)
Globulin: 2.7 g/dL (ref 1.9–3.7)
Glucose, Bld: 136 mg/dL — ABNORMAL HIGH (ref 65–99)
Potassium: 4.3 mmol/L (ref 3.5–5.3)
Sodium: 139 mmol/L (ref 135–146)
Total Bilirubin: 0.6 mg/dL (ref 0.2–1.2)
Total Protein: 7 g/dL (ref 6.1–8.1)
eGFR: 111 mL/min/1.73m2

## 2023-12-30 LAB — CBC WITH DIFFERENTIAL/PLATELET
Absolute Lymphocytes: 1858 {cells}/uL (ref 850–3900)
Absolute Monocytes: 427 {cells}/uL (ref 200–950)
Basophils Absolute: 32 {cells}/uL (ref 0–200)
Basophils Relative: 0.6 %
Eosinophils Absolute: 221 {cells}/uL (ref 15–500)
Eosinophils Relative: 4.1 %
HCT: 45 % (ref 39.4–51.1)
Hemoglobin: 15.3 g/dL (ref 13.2–17.1)
MCH: 30.8 pg (ref 27.0–33.0)
MCHC: 34 g/dL (ref 31.6–35.4)
MCV: 90.7 fL (ref 81.4–101.7)
MPV: 10 fL (ref 7.5–12.5)
Monocytes Relative: 7.9 %
Neutro Abs: 2862 {cells}/uL (ref 1500–7800)
Neutrophils Relative %: 53 %
Platelets: 273 Thousand/uL (ref 140–400)
RBC: 4.96 Million/uL (ref 4.20–5.80)
RDW: 12.8 % (ref 11.0–15.0)
Total Lymphocyte: 34.4 %
WBC: 5.4 Thousand/uL (ref 3.8–10.8)

## 2023-12-30 LAB — LIPID PANEL
Cholesterol: 146 mg/dL
HDL: 31 mg/dL — ABNORMAL LOW
LDL Cholesterol (Calc): 84 mg/dL
Non-HDL Cholesterol (Calc): 115 mg/dL
Total CHOL/HDL Ratio: 4.7 (calc)
Triglycerides: 213 mg/dL — ABNORMAL HIGH

## 2024-01-01 ENCOUNTER — Ambulatory Visit: Payer: Self-pay | Admitting: Nurse Practitioner

## 2024-06-28 ENCOUNTER — Ambulatory Visit: Admitting: Nurse Practitioner
